# Patient Record
Sex: Male | Born: 1955
Health system: Southern US, Community
[De-identification: ages and names within clinical notes are randomized; demographics above are authoritative.]

## PROBLEM LIST (undated history)

## (undated) DIAGNOSIS — E119 Type 2 diabetes mellitus without complications: Secondary | ICD-10-CM

## (undated) DIAGNOSIS — H269 Unspecified cataract: Secondary | ICD-10-CM

## (undated) DIAGNOSIS — W3400XA Accidental discharge from unspecified firearms or gun, initial encounter: Secondary | ICD-10-CM

## (undated) DIAGNOSIS — I10 Essential (primary) hypertension: Secondary | ICD-10-CM

## (undated) DIAGNOSIS — F32A Depression, unspecified: Secondary | ICD-10-CM

## (undated) DIAGNOSIS — T79A0XA Compartment syndrome, unspecified, initial encounter: Secondary | ICD-10-CM

## (undated) DIAGNOSIS — S71139A Puncture wound without foreign body, unspecified thigh, initial encounter: Secondary | ICD-10-CM

## (undated) DIAGNOSIS — B1920 Unspecified viral hepatitis C without hepatic coma: Secondary | ICD-10-CM

## (undated) DIAGNOSIS — J189 Pneumonia, unspecified organism: Secondary | ICD-10-CM

## (undated) DIAGNOSIS — F329 Major depressive disorder, single episode, unspecified: Secondary | ICD-10-CM

## (undated) HISTORY — DX: Type 2 diabetes mellitus without complications: E11.9

## (undated) HISTORY — PX: LEG SURGERY: SHX1003

## (undated) HISTORY — PX: OTHER SURGICAL HISTORY: SHX169

## (undated) HISTORY — PX: HERNIA REPAIR: SHX51

## (undated) HISTORY — PX: KNEE SURGERY: SHX244

## (undated) HISTORY — DX: Unspecified cataract: H26.9

---

## 1999-10-03 ENCOUNTER — Encounter: Payer: Self-pay | Admitting: Emergency Medicine

## 1999-10-03 ENCOUNTER — Emergency Department (HOSPITAL_COMMUNITY): Admission: EM | Admit: 1999-10-03 | Discharge: 1999-10-03 | Payer: Self-pay | Admitting: Emergency Medicine

## 2001-08-16 ENCOUNTER — Emergency Department (HOSPITAL_COMMUNITY): Admission: EM | Admit: 2001-08-16 | Discharge: 2001-08-16 | Payer: Self-pay | Admitting: Emergency Medicine

## 2001-08-17 ENCOUNTER — Ambulatory Visit (HOSPITAL_COMMUNITY): Admission: RE | Admit: 2001-08-17 | Discharge: 2001-08-17 | Payer: Self-pay | Admitting: Specialist

## 2002-11-13 ENCOUNTER — Inpatient Hospital Stay (HOSPITAL_COMMUNITY): Admission: EM | Admit: 2002-11-13 | Discharge: 2002-12-25 | Payer: Self-pay

## 2002-11-13 ENCOUNTER — Encounter (INDEPENDENT_AMBULATORY_CARE_PROVIDER_SITE_OTHER): Payer: Self-pay | Admitting: *Deleted

## 2002-11-14 ENCOUNTER — Encounter: Payer: Self-pay | Admitting: Gastroenterology

## 2002-11-15 ENCOUNTER — Encounter: Payer: Self-pay | Admitting: *Deleted

## 2003-01-30 ENCOUNTER — Ambulatory Visit (HOSPITAL_COMMUNITY): Admission: RE | Admit: 2003-01-30 | Discharge: 2003-01-30 | Payer: Self-pay | Admitting: Plastic Surgery

## 2003-02-25 ENCOUNTER — Encounter: Admission: RE | Admit: 2003-02-25 | Discharge: 2003-02-25 | Payer: Self-pay | Admitting: Family Medicine

## 2003-03-11 ENCOUNTER — Encounter: Admission: RE | Admit: 2003-03-11 | Discharge: 2003-03-11 | Payer: Self-pay | Admitting: Family Medicine

## 2003-03-19 ENCOUNTER — Inpatient Hospital Stay (HOSPITAL_COMMUNITY): Admission: EM | Admit: 2003-03-19 | Discharge: 2003-03-21 | Payer: Self-pay | Admitting: Emergency Medicine

## 2003-03-27 ENCOUNTER — Encounter: Admission: RE | Admit: 2003-03-27 | Discharge: 2003-03-27 | Payer: Self-pay | Admitting: Family Medicine

## 2003-04-10 ENCOUNTER — Encounter: Admission: RE | Admit: 2003-04-10 | Discharge: 2003-04-10 | Payer: Self-pay | Admitting: Sports Medicine

## 2003-04-24 ENCOUNTER — Encounter: Admission: RE | Admit: 2003-04-24 | Discharge: 2003-04-24 | Payer: Self-pay | Admitting: Family Medicine

## 2003-04-25 ENCOUNTER — Encounter
Admission: RE | Admit: 2003-04-25 | Discharge: 2003-07-24 | Payer: Self-pay | Admitting: Physical Medicine & Rehabilitation

## 2003-06-11 ENCOUNTER — Encounter
Admission: RE | Admit: 2003-06-11 | Discharge: 2003-09-09 | Payer: Self-pay | Admitting: Physical Medicine & Rehabilitation

## 2003-10-20 ENCOUNTER — Encounter
Admission: RE | Admit: 2003-10-20 | Discharge: 2003-11-20 | Payer: Self-pay | Admitting: Physical Medicine & Rehabilitation

## 2003-11-14 ENCOUNTER — Ambulatory Visit: Payer: Self-pay | Admitting: Psychology

## 2003-11-20 ENCOUNTER — Encounter
Admission: RE | Admit: 2003-11-20 | Discharge: 2004-02-18 | Payer: Self-pay | Admitting: Physical Medicine & Rehabilitation

## 2003-11-25 ENCOUNTER — Ambulatory Visit: Payer: Self-pay | Admitting: Physical Medicine & Rehabilitation

## 2004-01-26 ENCOUNTER — Ambulatory Visit: Payer: Self-pay | Admitting: Physical Medicine & Rehabilitation

## 2004-03-23 ENCOUNTER — Ambulatory Visit: Payer: Self-pay | Admitting: Family Medicine

## 2004-04-22 ENCOUNTER — Ambulatory Visit: Payer: Self-pay | Admitting: Family Medicine

## 2004-05-06 ENCOUNTER — Ambulatory Visit: Payer: Self-pay | Admitting: Family Medicine

## 2004-05-21 ENCOUNTER — Encounter
Admission: RE | Admit: 2004-05-21 | Discharge: 2004-08-19 | Payer: Self-pay | Admitting: Physical Medicine & Rehabilitation

## 2004-05-25 ENCOUNTER — Ambulatory Visit: Payer: Self-pay | Admitting: Physical Medicine & Rehabilitation

## 2004-05-27 ENCOUNTER — Ambulatory Visit: Payer: Self-pay | Admitting: Family Medicine

## 2004-09-21 ENCOUNTER — Ambulatory Visit: Payer: Self-pay | Admitting: Family Medicine

## 2004-10-08 ENCOUNTER — Ambulatory Visit: Payer: Self-pay | Admitting: Physical Medicine & Rehabilitation

## 2004-10-08 ENCOUNTER — Encounter
Admission: RE | Admit: 2004-10-08 | Discharge: 2005-01-06 | Payer: Self-pay | Admitting: Physical Medicine & Rehabilitation

## 2004-11-16 ENCOUNTER — Ambulatory Visit: Payer: Self-pay | Admitting: Physical Medicine & Rehabilitation

## 2004-12-17 ENCOUNTER — Ambulatory Visit: Payer: Self-pay | Admitting: Physical Medicine & Rehabilitation

## 2005-01-17 ENCOUNTER — Ambulatory Visit: Payer: Self-pay | Admitting: Physical Medicine & Rehabilitation

## 2005-01-17 ENCOUNTER — Encounter
Admission: RE | Admit: 2005-01-17 | Discharge: 2005-04-17 | Payer: Self-pay | Admitting: Physical Medicine & Rehabilitation

## 2005-03-14 ENCOUNTER — Ambulatory Visit: Payer: Self-pay | Admitting: Physical Medicine & Rehabilitation

## 2005-05-06 ENCOUNTER — Ambulatory Visit: Payer: Self-pay | Admitting: Physical Medicine & Rehabilitation

## 2005-05-06 ENCOUNTER — Encounter
Admission: RE | Admit: 2005-05-06 | Discharge: 2005-08-04 | Payer: Self-pay | Admitting: Physical Medicine & Rehabilitation

## 2005-06-07 ENCOUNTER — Ambulatory Visit: Payer: Self-pay | Admitting: Family Medicine

## 2005-06-21 ENCOUNTER — Ambulatory Visit: Payer: Self-pay | Admitting: Family Medicine

## 2005-07-04 ENCOUNTER — Ambulatory Visit: Payer: Self-pay | Admitting: Physical Medicine & Rehabilitation

## 2005-07-04 ENCOUNTER — Ambulatory Visit: Payer: Self-pay | Admitting: Psychology

## 2005-07-04 ENCOUNTER — Encounter: Admission: RE | Admit: 2005-07-04 | Discharge: 2005-10-02 | Payer: Self-pay

## 2005-07-12 ENCOUNTER — Ambulatory Visit: Payer: Self-pay | Admitting: Family Medicine

## 2005-07-28 ENCOUNTER — Encounter: Admission: RE | Admit: 2005-07-28 | Discharge: 2005-07-28 | Payer: Self-pay | Admitting: Sports Medicine

## 2005-07-28 ENCOUNTER — Ambulatory Visit: Payer: Self-pay | Admitting: Family Medicine

## 2005-08-05 ENCOUNTER — Ambulatory Visit: Payer: Self-pay | Admitting: Family Medicine

## 2005-08-08 ENCOUNTER — Ambulatory Visit: Payer: Self-pay | Admitting: Family Medicine

## 2005-08-26 ENCOUNTER — Ambulatory Visit: Payer: Self-pay | Admitting: Physical Medicine & Rehabilitation

## 2005-08-26 ENCOUNTER — Encounter
Admission: RE | Admit: 2005-08-26 | Discharge: 2005-11-24 | Payer: Self-pay | Admitting: Physical Medicine & Rehabilitation

## 2005-10-21 ENCOUNTER — Ambulatory Visit: Payer: Self-pay | Admitting: Physical Medicine & Rehabilitation

## 2005-11-23 ENCOUNTER — Encounter
Admission: RE | Admit: 2005-11-23 | Discharge: 2006-02-21 | Payer: Self-pay | Admitting: Physical Medicine & Rehabilitation

## 2005-11-23 ENCOUNTER — Ambulatory Visit: Payer: Self-pay | Admitting: Physical Medicine & Rehabilitation

## 2006-01-10 ENCOUNTER — Ambulatory Visit: Payer: Self-pay | Admitting: Family Medicine

## 2006-01-13 ENCOUNTER — Encounter
Admission: RE | Admit: 2006-01-13 | Discharge: 2006-04-13 | Payer: Self-pay | Admitting: Physical Medicine & Rehabilitation

## 2006-01-18 ENCOUNTER — Ambulatory Visit: Payer: Self-pay | Admitting: Physical Medicine & Rehabilitation

## 2006-03-14 ENCOUNTER — Encounter
Admission: RE | Admit: 2006-03-14 | Discharge: 2006-06-12 | Payer: Self-pay | Admitting: Physical Medicine and Rehabilitation

## 2006-03-14 ENCOUNTER — Ambulatory Visit: Payer: Self-pay | Admitting: Physical Medicine and Rehabilitation

## 2006-03-17 ENCOUNTER — Emergency Department (HOSPITAL_COMMUNITY): Admission: EM | Admit: 2006-03-17 | Discharge: 2006-03-17 | Payer: Self-pay | Admitting: Emergency Medicine

## 2006-03-24 ENCOUNTER — Encounter (INDEPENDENT_AMBULATORY_CARE_PROVIDER_SITE_OTHER): Payer: Self-pay | Admitting: Family Medicine

## 2006-03-24 ENCOUNTER — Ambulatory Visit: Payer: Self-pay | Admitting: Family Medicine

## 2006-03-24 ENCOUNTER — Ambulatory Visit (HOSPITAL_COMMUNITY): Admission: RE | Admit: 2006-03-24 | Discharge: 2006-03-24 | Payer: Self-pay | Admitting: Family Medicine

## 2006-03-24 LAB — CONVERTED CEMR LAB
BUN: 11 mg/dL (ref 6–23)
Calcium: 9.6 mg/dL (ref 8.4–10.5)
HDL: 41 mg/dL (ref >39)
Potassium: 5.3 meq/L (ref 3.5–5.3)
Sodium: 138 meq/L (ref 135–145)
Total CHOL/HDL Ratio: 4.6
VLDL: 17 mg/dL (ref 0–40)

## 2006-04-06 ENCOUNTER — Encounter: Payer: Self-pay | Admitting: Family Medicine

## 2006-04-06 ENCOUNTER — Ambulatory Visit: Payer: Self-pay | Admitting: Family Medicine

## 2006-04-06 LAB — CONVERTED CEMR LAB
ALT: 75 units/L — ABNORMAL HIGH (ref 0–53)
Chloride: 97 meq/L (ref 96–112)
Potassium: 4.4 meq/L (ref 3.5–5.3)
Sodium: 133 meq/L — ABNORMAL LOW (ref 135–145)
TSH: 3.472 microintl units/mL (ref 0.350–5.50)

## 2006-04-10 ENCOUNTER — Encounter
Admission: RE | Admit: 2006-04-10 | Discharge: 2006-07-09 | Payer: Self-pay | Admitting: Physical Medicine & Rehabilitation

## 2006-04-18 ENCOUNTER — Ambulatory Visit: Payer: Self-pay | Admitting: Physical Medicine & Rehabilitation

## 2006-04-27 DIAGNOSIS — I10 Essential (primary) hypertension: Secondary | ICD-10-CM | POA: Insufficient documentation

## 2006-04-27 DIAGNOSIS — Z720 Tobacco use: Secondary | ICD-10-CM

## 2006-04-27 DIAGNOSIS — F329 Major depressive disorder, single episode, unspecified: Secondary | ICD-10-CM

## 2006-04-27 DIAGNOSIS — B1710 Acute hepatitis C without hepatic coma: Secondary | ICD-10-CM | POA: Insufficient documentation

## 2006-04-27 DIAGNOSIS — F32A Depression, unspecified: Secondary | ICD-10-CM | POA: Insufficient documentation

## 2006-04-27 DIAGNOSIS — S86909S Unspecified injury of unspecified muscle(s) and tendon(s) at lower leg level, unspecified leg, sequela: Secondary | ICD-10-CM

## 2006-04-27 DIAGNOSIS — M79609 Pain in unspecified limb: Secondary | ICD-10-CM

## 2006-04-28 ENCOUNTER — Ambulatory Visit: Payer: Self-pay | Admitting: Family Medicine

## 2006-05-02 ENCOUNTER — Ambulatory Visit: Payer: Self-pay | Admitting: Family Medicine

## 2006-05-12 ENCOUNTER — Ambulatory Visit: Payer: Self-pay | Admitting: Physical Medicine and Rehabilitation

## 2006-06-13 ENCOUNTER — Encounter
Admission: RE | Admit: 2006-06-13 | Discharge: 2006-09-11 | Payer: Self-pay | Admitting: Physical Medicine and Rehabilitation

## 2006-07-10 ENCOUNTER — Encounter
Admission: RE | Admit: 2006-07-10 | Discharge: 2006-10-08 | Payer: Self-pay | Admitting: Physical Medicine & Rehabilitation

## 2006-07-11 ENCOUNTER — Ambulatory Visit: Payer: Self-pay | Admitting: Physical Medicine & Rehabilitation

## 2006-08-07 ENCOUNTER — Ambulatory Visit: Payer: Self-pay | Admitting: Physical Medicine and Rehabilitation

## 2006-09-07 ENCOUNTER — Ambulatory Visit: Payer: Self-pay | Admitting: Physical Medicine and Rehabilitation

## 2006-10-10 ENCOUNTER — Encounter
Admission: RE | Admit: 2006-10-10 | Discharge: 2006-11-28 | Payer: Self-pay | Admitting: Physical Medicine and Rehabilitation

## 2006-11-06 ENCOUNTER — Encounter
Admission: RE | Admit: 2006-11-06 | Discharge: 2007-02-04 | Payer: Self-pay | Admitting: Physical Medicine & Rehabilitation

## 2006-11-07 ENCOUNTER — Ambulatory Visit: Payer: Self-pay | Admitting: Physical Medicine & Rehabilitation

## 2006-12-01 ENCOUNTER — Ambulatory Visit: Payer: Self-pay | Admitting: Physical Medicine and Rehabilitation

## 2006-12-27 ENCOUNTER — Ambulatory Visit: Payer: Self-pay | Admitting: Physical Medicine & Rehabilitation

## 2007-01-08 ENCOUNTER — Encounter: Payer: Self-pay | Admitting: *Deleted

## 2007-01-08 ENCOUNTER — Ambulatory Visit: Payer: Self-pay | Admitting: Family Medicine

## 2007-01-08 ENCOUNTER — Telehealth: Payer: Self-pay | Admitting: *Deleted

## 2007-01-15 ENCOUNTER — Ambulatory Visit: Payer: Self-pay | Admitting: Family Medicine

## 2007-01-29 ENCOUNTER — Encounter
Admission: RE | Admit: 2007-01-29 | Discharge: 2007-04-29 | Payer: Self-pay | Admitting: Physical Medicine & Rehabilitation

## 2007-01-29 ENCOUNTER — Ambulatory Visit: Payer: Self-pay | Admitting: Physical Medicine & Rehabilitation

## 2007-02-19 ENCOUNTER — Ambulatory Visit: Payer: Self-pay | Admitting: Physical Medicine & Rehabilitation

## 2007-03-21 ENCOUNTER — Ambulatory Visit: Payer: Self-pay | Admitting: Physical Medicine & Rehabilitation

## 2007-04-23 ENCOUNTER — Encounter
Admission: RE | Admit: 2007-04-23 | Discharge: 2007-07-22 | Payer: Self-pay | Admitting: Physical Medicine & Rehabilitation

## 2007-04-23 ENCOUNTER — Ambulatory Visit: Payer: Self-pay | Admitting: Physical Medicine & Rehabilitation

## 2007-05-22 ENCOUNTER — Encounter: Payer: Self-pay | Admitting: Family Medicine

## 2007-05-22 ENCOUNTER — Ambulatory Visit: Payer: Self-pay | Admitting: Family Medicine

## 2007-05-22 ENCOUNTER — Inpatient Hospital Stay (HOSPITAL_COMMUNITY): Admission: EM | Admit: 2007-05-22 | Discharge: 2007-05-22 | Payer: Self-pay | Admitting: Family Medicine

## 2007-05-29 ENCOUNTER — Ambulatory Visit: Payer: Self-pay | Admitting: Physical Medicine & Rehabilitation

## 2007-06-26 ENCOUNTER — Ambulatory Visit: Payer: Self-pay | Admitting: Physical Medicine & Rehabilitation

## 2007-07-17 ENCOUNTER — Encounter
Admission: RE | Admit: 2007-07-17 | Discharge: 2007-10-15 | Payer: Self-pay | Admitting: Physical Medicine & Rehabilitation

## 2007-07-24 ENCOUNTER — Ambulatory Visit: Payer: Self-pay | Admitting: Physical Medicine & Rehabilitation

## 2007-08-22 ENCOUNTER — Ambulatory Visit: Payer: Self-pay | Admitting: Physical Medicine & Rehabilitation

## 2007-09-18 ENCOUNTER — Ambulatory Visit: Payer: Self-pay | Admitting: Physical Medicine & Rehabilitation

## 2007-10-11 ENCOUNTER — Encounter
Admission: RE | Admit: 2007-10-11 | Discharge: 2008-01-09 | Payer: Self-pay | Admitting: Physical Medicine & Rehabilitation

## 2007-10-16 ENCOUNTER — Ambulatory Visit: Payer: Self-pay | Admitting: Physical Medicine & Rehabilitation

## 2007-11-14 ENCOUNTER — Ambulatory Visit: Payer: Self-pay | Admitting: Physical Medicine & Rehabilitation

## 2007-11-19 ENCOUNTER — Ambulatory Visit: Payer: Self-pay | Admitting: Family Medicine

## 2007-11-19 DIAGNOSIS — G5600 Carpal tunnel syndrome, unspecified upper limb: Secondary | ICD-10-CM

## 2007-12-11 ENCOUNTER — Telehealth: Payer: Self-pay | Admitting: *Deleted

## 2007-12-20 ENCOUNTER — Ambulatory Visit: Payer: Self-pay | Admitting: Physical Medicine & Rehabilitation

## 2007-12-23 ENCOUNTER — Emergency Department (HOSPITAL_COMMUNITY): Admission: EM | Admit: 2007-12-23 | Discharge: 2007-12-23 | Payer: Self-pay | Admitting: Emergency Medicine

## 2007-12-27 ENCOUNTER — Encounter: Payer: Self-pay | Admitting: *Deleted

## 2007-12-31 ENCOUNTER — Telehealth: Payer: Self-pay | Admitting: Family Medicine

## 2008-01-02 ENCOUNTER — Encounter: Payer: Self-pay | Admitting: Family Medicine

## 2008-01-11 ENCOUNTER — Encounter
Admission: RE | Admit: 2008-01-11 | Discharge: 2008-03-27 | Payer: Self-pay | Admitting: Physical Medicine & Rehabilitation

## 2008-01-16 ENCOUNTER — Ambulatory Visit: Payer: Self-pay | Admitting: Physical Medicine & Rehabilitation

## 2008-02-13 ENCOUNTER — Ambulatory Visit: Payer: Self-pay | Admitting: Physical Medicine & Rehabilitation

## 2008-03-13 ENCOUNTER — Encounter
Admission: RE | Admit: 2008-03-13 | Discharge: 2008-03-13 | Payer: Self-pay | Admitting: Physical Medicine & Rehabilitation

## 2008-03-13 ENCOUNTER — Ambulatory Visit: Payer: Self-pay | Admitting: Physical Medicine & Rehabilitation

## 2008-04-03 ENCOUNTER — Ambulatory Visit: Payer: Self-pay | Admitting: Family Medicine

## 2008-05-02 ENCOUNTER — Ambulatory Visit: Payer: Self-pay | Admitting: Physical Medicine & Rehabilitation

## 2008-05-02 ENCOUNTER — Encounter
Admission: RE | Admit: 2008-05-02 | Discharge: 2008-07-31 | Payer: Self-pay | Admitting: Physical Medicine & Rehabilitation

## 2008-06-02 ENCOUNTER — Ambulatory Visit: Payer: Self-pay | Admitting: Family Medicine

## 2008-06-02 LAB — CONVERTED CEMR LAB
Albumin: 4.1 g/dL (ref 3.5–5.2)
Basophils Absolute: 0 10*3/uL (ref 0.0–0.1)
Basophils Relative: 1 % (ref 0–1)
CO2: 28 meq/L (ref 19–32)
Creatinine, Ser: 0.87 mg/dL (ref 0.40–1.50)
Eosinophils Absolute: 0.7 10*3/uL (ref 0.0–0.7)
Eosinophils Relative: 9 % — ABNORMAL HIGH (ref 0–5)
Glucose, Bld: 114 mg/dL — ABNORMAL HIGH (ref 70–99)
HCT: 48.9 % (ref 39.0–52.0)
Lymphs Abs: 3 10*3/uL (ref 0.7–4.0)
MCHC: 34.2 g/dL (ref 30.0–36.0)
MCV: 93.9 fL (ref 78.0–100.0)
Monocytes Absolute: 0.7 10*3/uL (ref 0.1–1.0)
Neutro Abs: 3.3 10*3/uL (ref 1.7–7.7)
PSA: 0.31 ng/mL (ref 0.10–4.00)
Potassium: 4.8 meq/L (ref 3.5–5.3)
Total Protein: 8 g/dL (ref 6.0–8.3)

## 2008-06-03 ENCOUNTER — Encounter: Payer: Self-pay | Admitting: Family Medicine

## 2008-06-06 ENCOUNTER — Ambulatory Visit: Payer: Self-pay | Admitting: Physical Medicine & Rehabilitation

## 2008-06-23 ENCOUNTER — Ambulatory Visit: Payer: Self-pay | Admitting: Internal Medicine

## 2008-06-25 ENCOUNTER — Ambulatory Visit: Payer: Self-pay | Admitting: Family Medicine

## 2008-07-04 ENCOUNTER — Ambulatory Visit: Payer: Self-pay | Admitting: Physical Medicine & Rehabilitation

## 2008-07-07 ENCOUNTER — Ambulatory Visit: Payer: Self-pay | Admitting: Internal Medicine

## 2008-08-01 ENCOUNTER — Encounter
Admission: RE | Admit: 2008-08-01 | Discharge: 2008-08-26 | Payer: Self-pay | Admitting: Physical Medicine & Rehabilitation

## 2008-08-01 ENCOUNTER — Ambulatory Visit: Payer: Self-pay | Admitting: Physical Medicine & Rehabilitation

## 2008-08-05 ENCOUNTER — Encounter: Payer: Self-pay | Admitting: Family Medicine

## 2008-08-06 ENCOUNTER — Ambulatory Visit: Payer: Self-pay | Admitting: Family Medicine

## 2008-08-18 ENCOUNTER — Ambulatory Visit: Payer: Self-pay | Admitting: Family Medicine

## 2008-08-26 ENCOUNTER — Ambulatory Visit: Payer: Self-pay | Admitting: Physical Medicine & Rehabilitation

## 2008-09-15 ENCOUNTER — Ambulatory Visit: Payer: Self-pay | Admitting: Family Medicine

## 2008-10-13 ENCOUNTER — Encounter (INDEPENDENT_AMBULATORY_CARE_PROVIDER_SITE_OTHER): Payer: Self-pay | Admitting: *Deleted

## 2008-10-17 ENCOUNTER — Telehealth: Payer: Self-pay | Admitting: Family Medicine

## 2008-10-21 ENCOUNTER — Encounter: Payer: Self-pay | Admitting: Family Medicine

## 2008-10-30 ENCOUNTER — Ambulatory Visit: Payer: Self-pay | Admitting: Family Medicine

## 2008-12-08 ENCOUNTER — Ambulatory Visit: Payer: Self-pay | Admitting: Family Medicine

## 2008-12-08 LAB — CONVERTED CEMR LAB
BUN: 16 mg/dL (ref 6–23)
Calcium: 9.3 mg/dL (ref 8.4–10.5)
Chloride: 99 meq/L (ref 96–112)
Creatinine, Ser: 0.83 mg/dL (ref 0.40–1.50)
Glucose, Bld: 142 mg/dL — ABNORMAL HIGH (ref 70–99)
Potassium: 4.1 meq/L (ref 3.5–5.3)
Sodium: 134 meq/L — ABNORMAL LOW (ref 135–145)

## 2009-01-12 ENCOUNTER — Ambulatory Visit: Payer: Self-pay | Admitting: Family Medicine

## 2009-01-12 LAB — CONVERTED CEMR LAB
Albumin: 4.3 g/dL (ref 3.5–5.2)
Chloride: 96 meq/L (ref 96–112)
Creatinine, Ser: 0.86 mg/dL (ref 0.40–1.50)
Potassium: 4.7 meq/L (ref 3.5–5.3)
RDW: 13 % (ref 11.5–15.5)
TSH: 7.159 microintl units/mL — ABNORMAL HIGH (ref 0.350–4.500)
Total Bilirubin: 0.6 mg/dL (ref 0.3–1.2)

## 2009-02-09 ENCOUNTER — Ambulatory Visit: Payer: Self-pay | Admitting: Family Medicine

## 2009-02-09 LAB — CONVERTED CEMR LAB: Free T4: 1.17 ng/dL (ref 0.80–1.80)

## 2009-03-09 ENCOUNTER — Encounter: Payer: Self-pay | Admitting: Psychology

## 2009-03-09 ENCOUNTER — Ambulatory Visit: Payer: Self-pay | Admitting: Family Medicine

## 2009-04-13 ENCOUNTER — Ambulatory Visit: Payer: Self-pay | Admitting: Family Medicine

## 2009-05-11 ENCOUNTER — Ambulatory Visit: Payer: Self-pay | Admitting: Family Medicine

## 2009-07-13 ENCOUNTER — Ambulatory Visit: Payer: Self-pay | Admitting: Family Medicine

## 2009-07-14 ENCOUNTER — Encounter: Payer: Self-pay | Admitting: Family Medicine

## 2009-08-17 ENCOUNTER — Ambulatory Visit: Payer: Self-pay | Admitting: Family Medicine

## 2009-08-19 ENCOUNTER — Encounter: Payer: Self-pay | Admitting: Family Medicine

## 2009-10-07 ENCOUNTER — Ambulatory Visit: Payer: Self-pay | Admitting: Family Medicine

## 2009-11-12 ENCOUNTER — Ambulatory Visit: Payer: Self-pay | Admitting: Family Medicine

## 2010-02-03 ENCOUNTER — Ambulatory Visit: Payer: Self-pay | Admitting: Family Medicine

## 2010-02-15 ENCOUNTER — Ambulatory Visit: Payer: Self-pay | Admitting: Family Medicine

## 2010-02-15 ENCOUNTER — Telehealth: Payer: Self-pay | Admitting: Family Medicine

## 2010-02-15 DIAGNOSIS — S61409A Unspecified open wound of unspecified hand, initial encounter: Secondary | ICD-10-CM | POA: Insufficient documentation

## 2010-02-16 ENCOUNTER — Ambulatory Visit: Payer: Self-pay

## 2010-03-08 DIAGNOSIS — IMO0002 Reserved for concepts with insufficient information to code with codable children: Secondary | ICD-10-CM | POA: Insufficient documentation

## 2010-03-30 NOTE — Assessment & Plan Note (Signed)
Summary: f/u,df   Vital Signs:  Patient profile:   55 year old male Height:      71 inches Weight:      241 pounds BMI:     33.73 BSA:     2.28 Temp:     98.0 degrees F Pulse rate:   73 / minute BP sitting:   125 / 85  Vitals Entered By: Jone Baseman CMA (May 11, 2009 4:08 PM) CC: f/u Is Patient Diabetic? No Pain Assessment Patient in pain? yes     Location: left leg Intensity: 6   Primary Care Provider:  Pearlean Brownie MD  CC:  f/u.  History of Present Illness: HYPERTENSION Disease Monitoring   Blood pressure range: reports 120s/80s at home        Chest pain: N     Dyspnea:N Medications   Compliance:Took meds today   Lightheadedness: N     Edema:N Prevention   Exercise: not on a regular routine     Smoking does want to stop.  Down to 6 per day.  Will set a quit date  Leg Pain continues the fentanyl patches make bearable.  No skin breakdown or fevers   ROS - as above PMH - Medications reviewed and updated in medication list.  Smoking Status noted in VS form     Habits & Providers  Alcohol-Tobacco-Diet     Tobacco Status: current     Tobacco Counseling: to quit use of tobacco products     Cigarette Packs/Day: 0.5  Current Medications (verified): 1)  Amitriptyline Hcl 75 Mg  Tabs (Amitriptyline Hcl) .... 2 By Mouth At Bedtime 2)  Duragesic-50 50 Mcg/hr  Pt72 (Fentanyl) .Marland Kitchen.. 1 Patch Every 48 Hrs  Fill After 4/14 3)  Neurontin 800 Mg  Tabs (Gabapentin) .... 2 in Am and 1 in The Pm 4)  Adult Aspirin Low Strength 81 Mg  Tbdp (Aspirin) 5)  Lisinopril 20 Mg Tabs (Lisinopril) .Marland Kitchen.. 1 Tablet By Mouth Daily Take With Combination Pill 6)  Lisinopril-Hydrochlorothiazide 20-12.5 Mg Tabs (Lisinopril-Hydrochlorothiazide) .Marland Kitchen.. 1 Tablet By Mouth Daily 7)  Metoprolol Succinate 25 Mg Tb24 (Metoprolol Succinate) .... 2 Tabs Two Times A Day 8)  Duragesic-50 50 Mcg/hr Pt72 (Fentanyl) .Marland Kitchen.. 1 Q 48 Hrs  Allergies: 1)  ! Penicillin  Physical Exam  General:   Well-developed,well-nourished,in no acute distress; alert,appropriate and cooperative throughout examination   Impression & Recommendations:  Problem # 1:  HYPERTENSION, BENIGN SYSTEMIC (ICD-401.1) Assessment Improved  Continue current medications  His updated medication list for this problem includes:    Lisinopril 20 Mg Tabs (Lisinopril) .Marland Kitchen... 1 tablet by mouth daily take with combination pill    Lisinopril-hydrochlorothiazide 20-12.5 Mg Tabs (Lisinopril-hydrochlorothiazide) .Marland Kitchen... 1 tablet by mouth daily    Metoprolol Succinate 25 Mg Tb24 (Metoprolol succinate) .Marland Kitchen... 2 tabs two times a day  BP today: 125/85 Prior BP: 147/93 (04/13/2009)  Labs Reviewed: K+: 4.7 (01/12/2009) Creat: : 0.86 (01/12/2009)   Chol: 188 (03/24/2006)   HDL: 41 (03/24/2006)   LDL: 130 (03/24/2006)   TG: 83 (03/24/2006)  Orders: [facility]- Est Level  3 (99213)  Problem # 2:  LEG PAIN OR KNEE PAIN (ICD-729.5)  He may need to come off patch for $ reasons.  He will investigate.  continue Amitriptylene and Neurontin   Orders: [facility]- Est Level  3 (24401)  Problem # 3:  TOBACCO DEPENDENCE (ICD-305.1)  suggested quit date   Orders: [facility]- Est Level  3 (02725)  Complete Medication List: 1)  Amitriptyline Hcl 75  Mg Tabs (Amitriptyline hcl) .... 2 by mouth at bedtime 2)  Duragesic-50 50 Mcg/hr Pt72 (Fentanyl) .Marland Kitchen.. 1 patch every 48 hrs  fill after 4/14 3)  Neurontin 800 Mg Tabs (Gabapentin) .... 2 in am and 1 in the pm 4)  Adult Aspirin Low Strength 81 Mg Tbdp (Aspirin) 5)  Lisinopril 20 Mg Tabs (Lisinopril) .Marland Kitchen.. 1 tablet by mouth daily take with combination pill 6)  Lisinopril-hydrochlorothiazide 20-12.5 Mg Tabs (Lisinopril-hydrochlorothiazide) .Marland Kitchen.. 1 tablet by mouth daily 7)  Metoprolol Succinate 25 Mg Tb24 (Metoprolol succinate) .... 2 tabs two times a day 8)  Duragesic-50 50 Mcg/hr Pt72 (Fentanyl) .Marland Kitchen.. 1 q 48 hrs  Patient Instructions: 1)  Come back in 2 months 2)  Great work with the blood pressure 3)   Choose a quit date think of a substitute  Prescriptions: DURAGESIC-50 50 MCG/HR PT72 (FENTANYL) 1 q 48 hrs  #15 x 0   Entered and Authorized by:   Pearlean Brownie MD   Signed by:   Pearlean Brownie MD on 05/11/2009   Method used:   Handwritten   RxID:   [id number] DURAGESIC-50 50 MCG/HR  PT72 (FENTANYL) 1 patch every 48 hrs  fill after 4/14  #15 x 0   Entered and Authorized by:   Pearlean Brownie MD   Signed by:   Pearlean Brownie MD on 05/11/2009   Method used:   Handwritten   RxID:   [id number]

## 2010-03-30 NOTE — Assessment & Plan Note (Signed)
Summary: f/u,df   Vital Signs:  Patient profile:   55 year old male Height:      71 inches Weight:      243.1 pounds BMI:     34.03 Temp:     99.3 degrees F oral Pulse rate:   89 / minute BP sitting:   147 / 93  (right arm) Cuff size:   large  Vitals Entered By: Garen Grams LPN (April 13, 2009 3:41 PM) CC: med refills Is Patient Diabetic? No   Primary Care Provider:  Pearlean Brownie MD  CC:  med refills.  History of Present Illness: HYPERTENSION Disease Monitoring   Blood pressure range: still reports 120s/80s at home        Chest pain: N     Dyspnea:N Medications   Compliance:Took meds today   Lightheadedness: N     Edema:N Prevention   Exercise: not on a regular routine    Salt restriction:n  Smoking does want to stop.  Interested in [place] but not now.  Does not have time to talk with health counselor today  Leg Pain continues the fentanyl patches make bearable.  No skin breakdown or fevers   ROS - as above PMH - Medications reviewed and updated in medication list.  Smoking Status noted in VS form     Habits & Providers  Alcohol-Tobacco-Diet     Tobacco Status: current  Current Medications (verified): 1)  Amitriptyline Hcl 75 Mg  Tabs (Amitriptyline Hcl) .... 2 By Mouth At Bedtime 2)  Duragesic-50 50 Mcg/hr  Pt72 (Fentanyl) .Marland Kitchen.. 1 Patch Every 48 Hrs 3)  Neurontin 800 Mg  Tabs (Gabapentin) .... 2 in Am and 1 in The Pm 4)  Adult Aspirin Low Strength 81 Mg  Tbdp (Aspirin) 5)  Lisinopril 20 Mg Tabs (Lisinopril) .Marland Kitchen.. 1 Tablet By Mouth Daily Take With Combination Pill 6)  Lisinopril-Hydrochlorothiazide 20-12.5 Mg Tabs (Lisinopril-Hydrochlorothiazide) .Marland Kitchen.. 1 Tablet By Mouth Daily 7)  Metoprolol Succinate 25 Mg Tb24 (Metoprolol Succinate) .... 2 Tabs Two Times A Day  Allergies: 1)  ! Penicillin  Physical Exam  General:  Well-developed,well-nourished,in no acute distress; alert,appropriate and cooperative throughout examination   Impression &  Recommendations:  Problem # 1:  LEG PAIN OR KNEE PAIN (ICD-729.5) Assessment Unchanged  Refill his duragesic.  Using appropriately   Orders: [facility]- Est  Level 4 (16109)  Problem # 2:  HYPERTENSION, BENIGN SYSTEMIC (ICD-401.1) Assessment: Improved  Better control.  Asked him to bring in his readings  His updated medication list for this problem includes:    Lisinopril 20 Mg Tabs (Lisinopril) .Marland Kitchen... 1 tablet by mouth daily take with combination pill    Lisinopril-hydrochlorothiazide 20-12.5 Mg Tabs (Lisinopril-hydrochlorothiazide) .Marland Kitchen... 1 tablet by mouth daily    Metoprolol Succinate 25 Mg Tb24 (Metoprolol succinate) .Marland Kitchen... 2 tabs two times a day  BP today: 147/93 Prior BP: 165/118 (03/09/2009)  Labs Reviewed: K+: 4.7 (01/12/2009) Creat: : 0.86 (01/12/2009)   Chol: 188 (03/24/2006)   HDL: 41 (03/24/2006)   LDL: 130 (03/24/2006)   TG: 83 (03/24/2006)  Orders: [facility]- Est  Level 4 (60454)  Problem # 3:  TOBACCO DEPENDENCE (ICD-305.1) Asked to try to quit in his own.  If not succesful consider Chantix.  need to monitor closely with depressive history  Orders: [facility]- Est  Level 4 (99214)  Complete Medication List: 1)  Amitriptyline Hcl 75 Mg Tabs (Amitriptyline hcl) .... 2 by mouth at bedtime 2)  Duragesic-50 50 Mcg/hr Pt72 (Fentanyl) .Marland Kitchen.. 1 patch every 48 hrs  3)  Neurontin 800 Mg Tabs (Gabapentin) .... 2 in am and 1 in the pm 4)  Adult Aspirin Low Strength 81 Mg Tbdp (Aspirin) 5)  Lisinopril 20 Mg Tabs (Lisinopril) .Marland Kitchen.. 1 tablet by mouth daily take with combination pill 6)  Lisinopril-hydrochlorothiazide 20-12.5 Mg Tabs (Lisinopril-hydrochlorothiazide) .Marland Kitchen.. 1 tablet by mouth daily 7)  Metoprolol Succinate 25 Mg Tb24 (Metoprolol succinate) .... 2 tabs two times a day  Patient Instructions: 1)  Please schedule a follow-up appointment in 1 month.  2)  Write down blood pressure readings and bring next visit 3)  Keep working on stop smoking

## 2010-03-30 NOTE — Miscellaneous (Signed)
  Clinical Lists Changes  Medications: Changed medication from LISINOPRIL-HYDROCHLOROTHIAZIDE 20-12.5 MG TABS (LISINOPRIL-HYDROCHLOROTHIAZIDE) 1 tablet by mouth daily to LISINOPRIL-HYDROCHLOROTHIAZIDE 20-25 MG TABS (LISINOPRIL-HYDROCHLOROTHIAZIDE) 1 daily for blood pressure

## 2010-03-30 NOTE — Assessment & Plan Note (Signed)
Summary: f/u meds,df   Vital Signs:  Patient profile:   55 year old male Height:      71 inches Weight:      239 pounds BMI:     33.45 BSA:     2.28 Temp:     98.6 degrees F Pulse rate:   88 / minute BP sitting:   130 / 84  Vitals Entered By: Jone Baseman CMA (Jul 13, 2009 2:09 PM) CC: f/u Is Patient Diabetic? No Pain Assessment Patient in pain? no        Primary Care Provider:  Pearlean Brownie MD  CC:  f/u.  History of Present Illness: HYPERTENSION Disease Monitoring   Blood pressure range:120s/80s at home       Chest pain: N     Dyspnea:N Medications   Compliance: daily   Lightheadedness: N     Edema:N Prevention   Exercise: walking some    Leg pain  about the same.  Duragesic patch helps some  Depression difficulty sleeping and concentrating.  No suicidal ideation.  Feels this is chronic and worse for him since now on disability.  Tried counselor in past that did not help much but is willing to try again.    Had slightly high TSH last blood test.  Slowly losing weight but is trying.  No fevers   ROS - as above PMH - Medications reviewed and updated in medication list.  Smoking Status noted in VS form     Habits & Providers  Alcohol-Tobacco-Diet     Tobacco Status: current     Tobacco Counseling: to quit use of tobacco products     Cigarette Packs/Day: 0.5  Current Medications (verified): 1)  Amitriptyline Hcl 75 Mg  Tabs (Amitriptyline Hcl) .... 2 By Mouth At Bedtime 2)  Duragesic-50 50 Mcg/hr  Pt72 (Fentanyl) .Marland Kitchen.. 1 Patch Every 48 Hrs  Fill After 6/15 3)  Neurontin 800 Mg  Tabs (Gabapentin) .... 2 in Am and 1 in The Pm 4)  Adult Aspirin Low Strength 81 Mg  Tbdp (Aspirin) 5)  Lisinopril 20 Mg Tabs (Lisinopril) .Marland Kitchen.. 1 Tablet By Mouth Daily Take With Combination Pill 6)  Lisinopril-Hydrochlorothiazide 20-12.5 Mg Tabs (Lisinopril-Hydrochlorothiazide) .Marland Kitchen.. 1 Tablet By Mouth Daily 7)  Metoprolol Succinate 25 Mg Tb24 (Metoprolol Succinate) .... 2  Tabs Two Times A Day 8)  Duragesic-50 50 Mcg/hr Pt72 (Fentanyl) .Marland Kitchen.. 1 Q 48 Hrs 9)  Duragesic-50 50 Mcg/hr Pt72 (Fentanyl) .Marland Kitchen.. 1 Every 48 Hours Fill After 09/11/09  Allergies: 1)  ! Penicillin  Physical Exam  General:  Well-developed,well-nourished,in no acute distress; alert,appropriate and cooperative throughout examination   Impression & Recommendations:  Problem # 1:  HYPERTENSION, BENIGN SYSTEMIC (ICD-401.1)  Good control His updated medication list for this problem includes:    Lisinopril 20 Mg Tabs (Lisinopril) .Marland Kitchen... 1 tablet by mouth daily take with combination pill    Lisinopril-hydrochlorothiazide 20-12.5 Mg Tabs (Lisinopril-hydrochlorothiazide) .Marland Kitchen... 1 tablet by mouth daily    Metoprolol Succinate 25 Mg Tb24 (Metoprolol succinate) .Marland Kitchen... 2 tabs two times a day  BP today: 130/84 Prior BP: 125/85 (05/11/2009)  Labs Reviewed: K+: 4.7 (01/12/2009) Creat: : 0.86 (01/12/2009)   Chol: 188 (03/24/2006)   HDL: 41 (03/24/2006)   LDL: 130 (03/24/2006)   TG: 83 (03/24/2006)  Orders: [facility]- Est  Level 4 (91478)  Problem # 2:  DEPRESSIVE DISORDER, NOS (ICD-311) slight worsening.  Has tried to find a place to volunteer but hasn't yet.  Is open to counseling and he volunteered he  could try [facility] His updated medication list for this problem includes:    Amitriptyline Hcl 75 Mg Tabs (Amitriptyline hcl) .Marland Kitchen... 2 by mouth at bedtime  Orders: [facility] (64403-47425) [facility]- Est  Level 4 (99214)  Problem # 3:  LEG PAIN OR KNEE PAIN (ICD-729.5)  stable on current Duragesic patch.  Has used appropriately.  Will give 3 month supply  Orders: [facility]- Est  Level 4 (95638)  Complete Medication List: 1)  Amitriptyline Hcl 75 Mg Tabs (Amitriptyline hcl) .... 2 by mouth at bedtime 2)  Duragesic-50 50 Mcg/hr Pt72 (Fentanyl) .Marland Kitchen.. 1 patch every 48 hrs  fill after 6/15 3)  Neurontin 800 Mg Tabs (Gabapentin) .... 2 in am and 1 in the pm 4)  Adult Aspirin Low Strength 81 Mg Tbdp (Aspirin) 5)   Lisinopril 20 Mg Tabs (Lisinopril) .Marland Kitchen.. 1 tablet by mouth daily take with combination pill 6)  Lisinopril-hydrochlorothiazide 20-12.5 Mg Tabs (Lisinopril-hydrochlorothiazide) .Marland Kitchen.. 1 tablet by mouth daily 7)  Metoprolol Succinate 25 Mg Tb24 (Metoprolol succinate) .... 2 tabs two times a day 8)  Duragesic-50 50 Mcg/hr Pt72 (Fentanyl) .Marland Kitchen.. 1 q 48 hrs 9)  Duragesic-50 50 Mcg/hr Pt72 (Fentanyl) .Marland Kitchen.. 1 every 48 hours fill after 09/11/09  Patient Instructions: 1)  Please schedule a follow-up appointment in 3 months .  2)  I will call you if your lab is abnormal otherwise I will send you a letter within 2 weeks. 3)  Consider a 7 mg nicotine patch 4)  Consider attending our free stop smoking classes.  Session 1 the third Tuesday and Session 2 the fourth Tuesday of each month. Both at 4 PM at the [facility]. Please call [phone] to register or if you have questions.   5)  Contact the [facility] for counseling Prescriptions: DURAGESIC-50 50 MCG/HR PT72 (FENTANYL) 1 every 48 hours Fill after 09/11/09  #15 x 0   Entered and Authorized by:   Pearlean Brownie MD   Signed by:   Pearlean Brownie MD on 07/13/2009   Method used:   Handwritten   RxID:   [id number] DURAGESIC-50 50 MCG/HR PT72 (FENTANYL) 1 q 48 hrs  #15 x 0   Entered and Authorized by:   Pearlean Brownie MD   Signed by:   Pearlean Brownie MD on 07/13/2009   Method used:   Handwritten   RxID:   [id number]

## 2010-03-30 NOTE — Miscellaneous (Signed)
Summary: walk in  Clinical Lists Changes came in c/o bedbugs he got last week when [place] was at beach. red areas on r & l side of chest & abd. states there are some on his back as well.  also has had a sore throat x 3 days. VS obtained & placed in pcp schedule.[place] Circle RN  August 17, 2009 2:43 PM
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
## 2010-03-30 NOTE — Assessment & Plan Note (Signed)
Summary: f/up,tcb   Vital Signs:  Patient profile:   55 year old male Height:      71 inches Weight:      241 pounds BMI:     33.73 BSA:     2.28 Temp:     98.3 degrees F Pulse rate:   101 / minute BP sitting:   150 / 100  Vitals Entered By: Jone Baseman CMA (October 07, 2009 11:39 AM) CC: f/U Is Patient Diabetic? No Pain Assessment Patient in pain? no        Primary Care Provider:  Pearlean Brownie MD  CC:  f/U.  History of Present Illness: HYPERTENSION, BENIGN SYSTEMIC (ICD-401.1) Forgot his medications this am but relates that his blood pressure which he checks regularly is under good control.  Would like to get partial months rx of his metoprolol so can get all his medications at one visit.  No chest pain or shortness of breath or lightheadedness  TOBACCO DEPENDENCE (ICD-305.1) still 1/2 ppd.  Feels can stop on his own.  plans to do so in next month  Hepatitis C No abdominal pain or jaundice.  Wants to go to treatment but wants to stop smoking first   ROS - as above PMH - Medications reviewed and updated in medication list.  Smoking Status noted in VS form      Habits & Providers  Alcohol-Tobacco-Diet     Tobacco Status: current     Tobacco Counseling: to quit use of tobacco products     Cigarette Packs/Day: 0.5  Current Medications (verified): 1)  Amitriptyline Hcl 75 Mg  Tabs (Amitriptyline Hcl) .... 2 By Mouth At Bedtime 2)  Duragesic-50 50 Mcg/hr  Pt72 (Fentanyl) .Marland Kitchen.. 1 Patch Every 48 Hrs  Fill After 6/15 3)  Neurontin 800 Mg  Tabs (Gabapentin) .... 2 in Am and 1 in The Pm 4)  Adult Aspirin Low Strength 81 Mg  Tbdp (Aspirin) 5)  Lisinopril 20 Mg Tabs (Lisinopril) .Marland Kitchen.. 1 Tablet By Mouth Daily Take With Combination Pill 6)  Lisinopril-Hydrochlorothiazide 20-25 Mg Tabs (Lisinopril-Hydrochlorothiazide) .Marland Kitchen.. 1 Daily For Blood Pressure 7)  Metoprolol Succinate 25 Mg Tb24 (Metoprolol Succinate) .... 2 Tabs Two Times A Day 8)  Duragesic-50 50 Mcg/hr  Pt72 (Fentanyl) .Marland Kitchen.. 1 Q 48 Hrs 9)  Hydroxyzine Hcl 25 Mg Tabs (Hydroxyzine Hcl) .Marland Kitchen.. 1-2 By Mouth Q 6 Hrs As Needed For Itching 10)  Triamcinolone Acetonide 0.1 % Oint (Triamcinolone Acetonide) .... Apply Two Times A Day To Itching Areas 30 Gram  Allergies: 1)  ! Penicillin  Physical Exam  General:  Well-developed,well-nourished,in no acute distress; alert,appropriate and cooperative throughout examination   Impression & Recommendations:  Problem # 1:  HYPERTENSION, BENIGN SYSTEMIC (ICD-401.1) Assessment Deteriorated  he assures me is usually under good control but last visits has been elevated.  Will recheck in a month. His updated medication list for this problem includes:    Lisinopril 20 Mg Tabs (Lisinopril) .Marland Kitchen... 1 tablet by mouth daily take with combination pill    Lisinopril-hydrochlorothiazide 20-25 Mg Tabs (Lisinopril-hydrochlorothiazide) .Marland Kitchen... 1 daily for blood pressure    Metoprolol Succinate 25 Mg Tb24 (Metoprolol succinate) .Marland Kitchen... 2 tabs two times a day  BP today: 150/100 Prior BP: 157/106 (08/17/2009)  Labs Reviewed: K+: 4.7 (01/12/2009) Creat: : 0.86 (01/12/2009)   Chol: 188 (03/24/2006)   HDL: 41 (03/24/2006)   LDL: 130 (03/24/2006)   TG: 83 (03/24/2006)  Orders: [facility]- Est  Level 4 ([address]21)  Problem # 2:  TOBACCO DEPENDENCE (ICD-305.1)  again voices desire to quit.  Discussed plans and substitutes  Orders: [facility]- Est  Level 4 ([address])  Problem # 3:  HEPATITIS C (ICD-070.51) at risk for cancer.  Needs to have better blood pressure control and would be very good to have stopped smoking  Problem # 4:  LEG PAIN OR KNEE PAIN (ICD-729.5)  continues unchanged.  Continue patches and pain modulators   Orders: [facility]- Est  Level 4 ([address])  Complete Medication List: 1)  Amitriptyline Hcl 75 Mg Tabs (Amitriptyline hcl) .... 2 by mouth at bedtime 2)  Duragesic-50 50 Mcg/hr Pt72 (Fentanyl) .Marland Kitchen.. 1 patch every 48 hrs  fill after 6/15 3)  Neurontin 800 Mg Tabs (Gabapentin)  .... 2 in am and 1 in the pm 4)  Adult Aspirin Low Strength 81 Mg Tbdp (Aspirin) 5)  Lisinopril 20 Mg Tabs (Lisinopril) .Marland Kitchen.. 1 tablet by mouth daily take with combination pill 6)  Lisinopril-hydrochlorothiazide 20-25 Mg Tabs (Lisinopril-hydrochlorothiazide) .Marland Kitchen.. 1 daily for blood pressure 7)  Metoprolol Succinate 25 Mg Tb24 (Metoprolol succinate) .... 2 tabs two times a day 8)  Duragesic-50 50 Mcg/hr Pt72 (Fentanyl) .Marland Kitchen.. 1 q 48 hrs 9)  Hydroxyzine Hcl 25 Mg Tabs (Hydroxyzine hcl) .Marland Kitchen.. 1-2 by mouth q 6 hrs as needed for itching 10)  Triamcinolone Acetonide 0.1 % Oint (Triamcinolone acetonide) .... Apply two times a day to itching areas 30 gram  Patient Instructions: 1)  Please schedule a follow-up appointment in 1 month.  2)  Take your medicines every day 3)  Plan a quit date and come up with a substitute for smoking 4)  We need to discuss the Hep C treatment once your blood pressure is under control and you've stopped smoking Prescriptions: DURAGESIC-50 50 MCG/HR PT72 (FENTANYL) 1 q 48 hrs  #15 x 0   Entered and Authorized by:   Pearlean Brownie MD   Signed by:   Pearlean Brownie MD on 10/07/2009   Method used:   Handwritten   RxID:   [id number] NEURONTIN 800 MG  TABS (GABAPENTIN) 2 in AM and 1 in the PM  #45 x 0   Entered and Authorized by:   Pearlean Brownie MD   Signed by:   Pearlean Brownie MD on 10/07/2009   Method used:   [facility]        [facility] [phone]* (retail)       [address], [place]  [address]       Ph: [phone] or [phone]       Fax: [phone]   RxID:   [id number] METOPROLOL SUCCINATE 25 MG TB24 (METOPROLOL SUCCINATE) 2 tabs two times a day  #60 x 0   Entered and Authorized by:   Pearlean Brownie MD   Signed by:   Pearlean Brownie MD on 10/07/2009   Method used:   [facility]        [facility] [phone]* (retail)       [address]       [place], [place]  [address]       Ph: [phone] or  [phone]       Fax: [phone]   RxID:   [id number]

## 2010-03-30 NOTE — Letter (Signed)
Summary: Generic Letter  [facility]  [address]   [place], [place] [address]   Phone: [phone]  Fax: [phone]    07/14/2009  Andre Sherman [address] [place], [place]  [address]  Dear Andre Sherman,   I'm glad to say your thyroid test was normal.  Let me know if you have any problems finding a counselor.  Consider attending the smoking classes.    Sincerely,   Andre Brownie MD  Appended Document: Generic Letter mailed.  Appended Document: Generic Letter mailed.

## 2010-03-30 NOTE — Assessment & Plan Note (Signed)
Summary: fu/kh   Vital Signs:  Patient profile:   55 year old male Height:      71 inches Weight:      235 pounds BMI:     32.89 BSA:     2.26 Temp:     98.6 degrees F Pulse rate:   103 / minute BP sitting:   165 / 118  Vitals Entered By: Jone Baseman CMA (March 09, 2009 2:28 PM) CC: f/u and med refills Is Patient Diabetic? No Pain Assessment Patient in pain? yes     Location: left leg Intensity: 7   Primary Care Provider:  Pearlean Brownie MD  CC:  f/u and med refills.  History of Present Illness: HYPERTENSION Disease Monitoring   Blood pressure range:120s/80s at home        Chest pain: N     Dyspnea:N Medications   Compliance: Forgot medicines today and got speeding ticket on way here   Lightheadedness: N     Edema:N Prevention   Exercise: not on a regular routine    Salt restriction:n  Smoking does want to stop.  A little more than 1/2 ppd.  Smokes in the AM and PM but not much in middle of day Still has chronic cough but is improved  Leg Pain continues the fentanyl patches make bearable.  No skin breakdown or fevers   ROS - as above PMH - Medications reviewed and updated in medication list.  Smoking Status noted in VS form     Habits & Providers  Alcohol-Tobacco-Diet     Tobacco Status: current     Tobacco Counseling: to quit use of tobacco products     Cigarette Packs/Day: 0.5  Current Medications (verified): 1)  Amitriptyline Hcl 75 Mg  Tabs (Amitriptyline Hcl) .... 2 By Mouth At Bedtime 2)  Duragesic-50 50 Mcg/hr  Pt72 (Fentanyl) .Marland Kitchen.. 1 Patch Every 48 Hrs 3)  Neurontin 800 Mg  Tabs (Gabapentin) .... 2 in Am and 1 in The Pm 4)  Adult Aspirin Low Strength 81 Mg  Tbdp (Aspirin) 5)  Lisinopril 20 Mg Tabs (Lisinopril) .Marland Kitchen.. 1 Tablet By Mouth Daily Take With Combination Pill 6)  Lisinopril-Hydrochlorothiazide 20-12.5 Mg Tabs (Lisinopril-Hydrochlorothiazide) .Marland Kitchen.. 1 Tablet By Mouth Daily 7)  Metoprolol Succinate 25 Mg Tb24 (Metoprolol Succinate)  .... 2 Tabs Two Times A Day  Allergies: 1)  ! Penicillin  Physical Exam  General:  Well-developed,well-nourished,in no acute distress; alert,appropriate and cooperative throughout examination   Impression & Recommendations:  Problem # 1:  HYPERTENSION, BENIGN SYSTEMIC (ICD-401.1) Not well controlled - seems to keep forgetting his medications. Also got a speeding ticket on way  here today.  Was improved on recheck.  He reports is well controlled at home.  ask to bring in readings  His updated medication list for this problem includes:    Lisinopril 20 Mg Tabs (Lisinopril) .Marland Kitchen... 1 tablet by mouth daily take with combination pill    Lisinopril-hydrochlorothiazide 20-12.5 Mg Tabs (Lisinopril-hydrochlorothiazide) .Marland Kitchen... 1 tablet by mouth daily    Metoprolol Succinate 25 Mg Tb24 (Metoprolol succinate) .Marland Kitchen... 2 tabs two times a day  BP today: 165/118 Prior BP: 146/91 (02/09/2009)  Labs Reviewed: K+: 4.7 (01/12/2009) Creat: : 0.86 (01/12/2009)   Chol: 188 (03/24/2006)   HDL: 41 (03/24/2006)   LDL: 130 (03/24/2006)   TG: 83 (03/24/2006)  Problem # 2:  TOBACCO DEPENDENCE (ICD-305.1) Met with Ms Clifton Kyrin see her note.  His goal is to stop so can get Hep C evaluation Orders: [facility]- Est  Level 4 ([address])  Problem # 3:  LEG PAIN OR KNEE PAIN (ICD-729.5) Continued baseline pain controlled with fentanyl patches.  No evidence of abuse or divergence   Complete Medication List: 1)  Amitriptyline Hcl 75 Mg Tabs (Amitriptyline hcl) .... 2 by mouth at bedtime 2)  Duragesic-50 50 Mcg/hr Pt72 (Fentanyl) .Marland Kitchen.. 1 patch every 48 hrs 3)  Neurontin 800 Mg Tabs (Gabapentin) .... 2 in am and 1 in the pm 4)  Adult Aspirin Low Strength 81 Mg Tbdp (Aspirin) 5)  Lisinopril 20 Mg Tabs (Lisinopril) .Marland Kitchen.. 1 tablet by mouth daily take with combination pill 6)  Lisinopril-hydrochlorothiazide 20-12.5 Mg Tabs (Lisinopril-hydrochlorothiazide) .Marland Kitchen.. 1 tablet by mouth daily 7)  Metoprolol Succinate 25 Mg Tb24 (Metoprolol  succinate) .... 2 tabs two times a day  Patient Instructions: 1)  Please schedule a follow-up appointment in 1 month.  2)  Work in on getting a routine 3)  Use a pill box every day 4)  Write down your blood pressure and bring in the readings 5)  Call if your blood pressure is > 160/95 regularly Prescriptions: DURAGESIC-50 50 MCG/HR  PT72 (FENTANYL) 1 patch every 48 hrs  #15 x 0   Entered and Authorized by:   Pearlean Brownie MD   Signed by:   Pearlean Brownie MD on 03/09/2009   Method used:   Handwritten   RxID:   [id number]

## 2010-03-30 NOTE — Assessment & Plan Note (Signed)
Summary: f/u meds,df   Vital Signs:  Patient profile:   55 year old male Height:      71 inches Weight:      242.56 pounds BMI:     33.95 Temp:     98.4 degrees F oral Pulse rate:   86 / minute BP sitting:   147 / 90  (right arm)  Vitals Entered By: Terese Door (February 03, 2010 12:01 PM) CC: F/U Medication Is Patient Diabetic? No Pain Assessment Patient in pain? yes     Location: leg Intensity: 6 Type: aching   Primary Care Provider:  Pearlean Brownie MD  CC:  F/U Medication.  History of Present Illness: Current Problems:  HEPATITIS C (ICD-070.51) HYPERTENSION, BENIGN SYSTEMIC (ICD-401.1) he is checking his blood pressure at home and has been under control.  Taking his medications regularly without prpblems.  No chest pain or lightheadedness or edema  TOBACCO DEPENDENCE (ICD-305.1) 8-12 cigs per day.  May try to stop after holidays  LEG PAIN OR KNEE PAIN (ICD-729.5) under fair to good control with patches.  Seem to last 48 hrs.  Allows him to be active and do some work and care for his grandson  DEPRESSIVE DISORDER, NOS (ICD-311) stable on curent medications.  He is stressed with finances but not feeling down or and suicidal ideation  ROS - as above PMH - Medications reviewed and updated in medication list.  Smoking Status noted in VS form      Habits & Providers  Alcohol-Tobacco-Diet     Tobacco Status: current     Tobacco Counseling: to quit use of tobacco products     Cigarette Packs/Day: 0.5  Allergies: 1)  ! Penicillin  Physical Exam  General:  Well-developed,well-nourished,in no acute distress; alert,appropriate and cooperative throughout examination   Impression & Recommendations:  Problem # 1:  HYPERTENSION, BENIGN SYSTEMIC (ICD-401.1)  stable  His updated medication list for this problem includes:    Lisinopril 20 Mg Tabs (Lisinopril) .Marland Kitchen... 1 tablet by mouth daily take with combination pill    Lisinopril-hydrochlorothiazide 20-25  Mg Tabs (Lisinopril-hydrochlorothiazide) .Marland Kitchen... 1 daily for blood pressure    Metoprolol Succinate 25 Mg Tb24 (Metoprolol succinate) .Marland Kitchen... 2 tabs two times a day  Orders: [facility]- Est  Level 4 ([address])  Problem # 2:  LEG PAIN OR KNEE PAIN (ICD-729.5) stable with current patches.  He is staying active   Problem # 3:  DEPRESSIVE DISORDER, NOS (ICD-311)  stable The following medications were removed from the medication list:    Hydroxyzine Hcl 25 Mg Tabs (Hydroxyzine hcl) .Marland Kitchen... 1-2 by mouth q 6 hrs as needed for itching His updated medication list for this problem includes:    Amitriptyline Hcl 75 Mg Tabs (Amitriptyline hcl) .Marland Kitchen... 2 by mouth at bedtime  Orders: [facility]- Est  Level 4 ([address])  Problem # 4:  HEPATITIS C (ICD-070.51) Now that his blood pressure is contrlled and hopefully if he can stop smoking will refer  for evaluation  Complete Medication List: 1)  Amitriptyline Hcl 75 Mg Tabs (Amitriptyline hcl) .... 2 by mouth at bedtime 2)  Duragesic-50 50 Mcg/hr Pt72 (Fentanyl) .Marland Kitchen.. 1 patch every 48 hrs  fill after 6/15 3)  Neurontin 800 Mg Tabs (Gabapentin) .... 2 in am and 1 in the pm 4)  Adult Aspirin Low Strength 81 Mg Tbdp (Aspirin) 5)  Lisinopril 20 Mg Tabs (Lisinopril) .Marland Kitchen.. 1 tablet by mouth daily take with combination pill 6)  Lisinopril-hydrochlorothiazide 20-25 Mg Tabs (Lisinopril-hydrochlorothiazide) .Marland Kitchen.. 1 daily  for blood pressure 7)  Metoprolol Succinate 25 Mg Tb24 (Metoprolol succinate) .... 2 tabs two times a day 8)  Duragesic-50 50 Mcg/hr Pt72 (Fentanyl) .Marland Kitchen.. 1 q 48 hrs  fill after 04/06/10  Patient Instructions: 1)  Please schedule a follow-up appointment in 3 months .  2)  Will check blood tests then 3)  Think about stopping smoking for the new year Prescriptions: DURAGESIC-50 50 MCG/HR PT72 (FENTANYL) 1 q 48 hrs  Fill after 04/06/10  #15 x 0   Entered and Authorized by:   Pearlean Brownie MD   Signed by:   Pearlean Brownie MD on 02/03/2010   Method used:    Handwritten   RxID:   [id number] DURAGESIC-50 50 MCG/HR PT72 (FENTANYL) 1 q 48 hrs  Fill after 03/06/10  #15 x 0   Entered and Authorized by:   Pearlean Brownie MD   Signed by:   Pearlean Brownie MD on 02/03/2010   Method used:   Handwritten   RxID:   [id number] DURAGESIC-50 50 MCG/HR PT72 (FENTANYL) 1 q 48 hrs  #15 x 0   Entered and Authorized by:   Pearlean Brownie MD   Signed by:   Pearlean Brownie MD on 02/03/2010   Method used:   Handwritten   RxID:   [id number]    Orders Added: 1)  [facility]- Est  Level 4 [[address]]     Prevention & Chronic Care Immunizations   Influenza vaccine: Fluvax 3+  (12/08/2008)   Influenza vaccine due: 11/18/2008    Tetanus booster: Not documented    Pneumococcal vaccine: Not documented  Colorectal Screening   Hemoccult: Not documented   Hemoccult action/deferral: Not indicated  (08/18/2008)   Hemoccult due: Not Indicated    Colonoscopy: Location:  [facility].    (07/07/2008)   Colonoscopy due: 06/2018  Other Screening   PSA: 0.31  (06/02/2008)   Smoking status: current  (02/03/2010)   Smoking cessation counseling: YES  (02/09/2009)  Lipids   Total Cholesterol: 188  (03/24/2006)   LDL: 130  (03/24/2006)   LDL Direct: Not documented   HDL: 41  (03/24/2006)   Triglycerides: 83  (03/24/2006)  Hypertension   Last Blood Pressure: 147 / 90  (02/03/2010)   Serum creatinine: 0.86  (01/12/2009)   Serum potassium 4.7  (01/12/2009)  Self-Management Support :    Hypertension self-management support: BP self-monitoring log, Written self-care plan  (10/30/2008)

## 2010-03-30 NOTE — Assessment & Plan Note (Signed)
Summary: sorethroat & red spots on body/[place]   Vital Signs:  Patient profile:   55 year old male Height:      71 inches Weight:      234 pounds BMI:     32.75 Temp:     98.6 degrees F oral Pulse rate:   81 / minute BP sitting:   157 / 106  (left arm) Cuff size:   large  Vitals Entered By: Garen Grams LPN (August 17, 2009 2:35 PM) CC: sore throat x 3 days and red, itchy spots on chest x 5 days Is Patient Diabetic? No Pain Assessment Patient in pain? no        Primary Care Provider:  Pearlean Brownie MD  CC:  sore throat x 3 days and red and itchy spots on chest x 5 days.  History of Present Illness: Sore throat  for last 3 days.  No fever but hard to swallow.  No shortness of breath or swollen glands.  No new exposures.  Rash itchy bumps after slept in fold up bed and hotel at beach. No oral lesions or fevers or new medications or spreading.  No one else had but no one else slept in his bed  HTN did not take his medications today    Habits & Providers  Alcohol-Tobacco-Diet     Tobacco Status: current     Tobacco Counseling: to quit use of tobacco products     Cigarette Packs/Day: 0.5  Current Medications (verified): 1)  Amitriptyline Hcl 75 Mg  Tabs (Amitriptyline Hcl) .... 2 By Mouth At Bedtime 2)  Duragesic-50 50 Mcg/hr  Pt72 (Fentanyl) .Marland Kitchen.. 1 Patch Every 48 Hrs  Fill After 6/15 3)  Neurontin 800 Mg  Tabs (Gabapentin) .... 2 in Am and 1 in The Pm 4)  Adult Aspirin Low Strength 81 Mg  Tbdp (Aspirin) 5)  Lisinopril 20 Mg Tabs (Lisinopril) .Marland Kitchen.. 1 Tablet By Mouth Daily Take With Combination Pill 6)  Lisinopril-Hydrochlorothiazide 20-12.5 Mg Tabs (Lisinopril-Hydrochlorothiazide) .Marland Kitchen.. 1 Tablet By Mouth Daily 7)  Metoprolol Succinate 25 Mg Tb24 (Metoprolol Succinate) .... 2 Tabs Two Times A Day 8)  Duragesic-50 50 Mcg/hr Pt72 (Fentanyl) .Marland Kitchen.. 1 Q 48 Hrs 9)  Duragesic-50 50 Mcg/hr Pt72 (Fentanyl) .Marland Kitchen.. 1 Every 48 Hours Fill After 09/11/09 10)  Hydroxyzine Hcl 25 Mg Tabs  (Hydroxyzine Hcl) .Marland Kitchen.. 1-2 By Mouth Q 6 Hrs As Needed For Itching 11)  Triamcinolone Acetonide 0.1 % Oint (Triamcinolone Acetonide) .... Apply Two Times A Day To Itching Areas 30 Gram  Allergies: 1)  ! Penicillin  Physical Exam  General:  Well-developed,well-nourished,in no acute distress; alert,appropriate and cooperative throughout examination Mouth:  Oral mucosa  without lesions or exudates.   Throat is red without exudates Neck:  No deformities, masses, or tenderness noted. No adenopathy Lungs:  Normal respiratory effort, chest expands symmetrically. Lungs are clear to auscultation, no crackles or wheezes. Heart:  Normal rate and regular rhythm. S1 and S2 normal without gallop, murmur, click, rub or other extra sounds. Skin:  discrete red papules scattered on trunk under arms and back.  No red streaks but has excoriations.     Impression & Recommendations:  Problem # 1:  SKIN RASH (ICD-782.1)  most consistent with bed bug or other insect bite.  No red flags for systemic illness.  Treat with steroid ointment and atarax His updated medication list for this problem includes:    Triamcinolone Acetonide 0.1 % Oint (Triamcinolone acetonide) .Marland Kitchen... Apply two times a day to itching  areas 30 gram  Orders: [facility]- Est  Level 4 ([address])  Problem # 2:  SORE THROAT (ICD-462) Viral.  Treat symptomatically  His updated medication list for this problem includes:    Adult Aspirin Low Strength 81 Mg Tbdp (Aspirin)  Orders: Rapid Strep-[facility] ([address]) [facility]- Est  Level 4 ([address])  Problem # 3:  HYPERTENSION, BENIGN SYSTEMIC (ICD-401.1)  Poorly controlled today but did not take his medications.  Home readings according to him have been good His updated medication list for this problem includes:    Lisinopril 20 Mg Tabs (Lisinopril) .Marland Kitchen... 1 tablet by mouth daily take with combination pill    Lisinopril-hydrochlorothiazide 20-12.5 Mg Tabs (Lisinopril-hydrochlorothiazide) .Marland Kitchen... 1 tablet by mouth daily     Metoprolol Succinate 25 Mg Tb24 (Metoprolol succinate) .Marland Kitchen... 2 tabs two times a day  Orders: [facility]- Est  Level 4 ([address])  Problem # 4:  TOBACCO DEPENDENCE (ICD-305.1) Aims to stop smoking by August visit  Complete Medication List: 1)  Amitriptyline Hcl 75 Mg Tabs (Amitriptyline hcl) .... 2 by mouth at bedtime 2)  Duragesic-50 50 Mcg/hr Pt72 (Fentanyl) .Marland Kitchen.. 1 patch every 48 hrs  fill after 6/15 3)  Neurontin 800 Mg Tabs (Gabapentin) .... 2 in am and 1 in the pm 4)  Adult Aspirin Low Strength 81 Mg Tbdp (Aspirin) 5)  Lisinopril 20 Mg Tabs (Lisinopril) .Marland Kitchen.. 1 tablet by mouth daily take with combination pill 6)  Lisinopril-hydrochlorothiazide 20-12.5 Mg Tabs (Lisinopril-hydrochlorothiazide) .Marland Kitchen.. 1 tablet by mouth daily 7)  Metoprolol Succinate 25 Mg Tb24 (Metoprolol succinate) .... 2 tabs two times a day 8)  Duragesic-50 50 Mcg/hr Pt72 (Fentanyl) .Marland Kitchen.. 1 q 48 hrs 9)  Duragesic-50 50 Mcg/hr Pt72 (Fentanyl) .Marland Kitchen.. 1 every 48 hours fill after 09/11/09 10)  Hydroxyzine Hcl 25 Mg Tabs (Hydroxyzine hcl) .Marland Kitchen.. 1-2 by mouth q 6 hrs as needed for itching 11)  Triamcinolone Acetonide 0.1 % Oint (Triamcinolone acetonide) .... Apply two times a day to itching areas 30 gram  Patient Instructions: 1)  Use the oinment on the itchy spots two times a day 2)  Use hydroxyzine as needed for itching will make you sleepy 3)  Use tylenol 2 regular tablets three times a day for throat pain 4)  Use salt water gargles 3x a day and chloraseptic spray for throat pain  5)  Call if not better in 7 days or if high fever  Prescriptions: TRIAMCINOLONE ACETONIDE 0.1 % OINT (TRIAMCINOLONE ACETONIDE) apply two times a day to itching areas 30 gram  #1 x 0   Entered and Authorized by:   Pearlean Brownie MD   Signed by:   Pearlean Brownie MD on 08/17/2009   Method used:   [facility]        [facility] [phone]* (retail)       [address], [place]  [address]       Ph: [phone] or [phone]        Fax: [phone]   RxID:   [id number] HYDROXYZINE HCL 25 MG TABS (HYDROXYZINE HCL) 1-2 by mouth q 6 hrs as needed for itching  #30 x 1   Entered and Authorized by:   Pearlean Brownie MD   Signed by:   Pearlean Brownie MD on 08/17/2009   Method used:   [facility] to        [facility] [phone]* (retail)       [address] [facility]  [place], [place]  [address]       Ph: [phone] or [phone]       Fax: [phone]   RxID:   [id number]   Laboratory Results  Date/Time Received: August 17, 2009 2:38 PM  Date/Time Reported: August 17, 2009 2:47 PM   Other Tests  Rapid Strep: negative Comments: ...........test performed by...........Marland KitchenTerese Door, CMA

## 2010-03-30 NOTE — Miscellaneous (Signed)
Summary: Behavioral Medicine Student Consultation  Clinical Lists Changes  Reason for consult: Patient presents with a history of smoking and high blood pressure. Request for consult to discuss current barriers to smoking cessation and medication adherence.  History of problem: Patient reported that he successfully stopped smoking previously for several months with the help of "the patch", but indicated that once he discontinued "the patch" he resumed smoking. Dr. Deirdre Priest would like for him to quit and indicated that he and the patient have had several conversations about the possibility of him quitting. Patient has also had difficulty with taking his medication.  Assessment: Patient was able to identify that smoking has become a significant problem for him and reported that he is very interested in quitting. He identified that smoking has resulted in a significant "smokers cough" and that he is put off by the phlegm that he coughs up. He reported that he wants to quit smoking, but is not sure what is keeping him from going from wanting to do it to actually quitting. Patient reported that he wants to take his medication, but often forgets.  Intervention: MI - contemplation/preparation. Discussed patients previous attempt to quit smoking. Patient reported that "the patch" worked and he was able to quit smoking completely for several months. He indicated that once he stopped using the patch he started smoking again within several weeks. Discussed reasons why this attempt to quit was not successful long-term. Next, we discussed patients current desire to quit smoking. He reported that he has been thinking a lot about quitting recently and is very interested in doing so. Discussed reasons that are preventing him from moving from thinking about it to "doing it." Patient was not able to identify any specific thing that was preventing him from quitting, but wanted to take a couple of days to think about it.  Next, we discussed the patients medication adherence. Patient reported that he wants to take his medication, but forgets to take it in the mornings. Discussed a plan to aid in remembering to take his morning doses. Patient agreed to put up a note by the coffee pot to remind him to take his medication first thing in the morning.    Recommended follow-up:Patient will make an appointment with Dr. Deirdre Priest to discuss being prescribed smoking cessation aids. Recommend to check in about making a behavior change plan during next appointment.   Kathe Becton, Behavioral Medicine Student
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
## 2010-04-01 NOTE — Progress Notes (Signed)
  Phone Note Other Incoming   [facility] Summary of Call: [facility] to say that pt at the pharmacy and thought he was told that [facility].  After review or record rx found to have been sent to [facility] on [address].  Gave verbal order for same antibiotic regimen as outlined in dr. Eden Lathe note.  [facility] that was [facility] there.  Ellin Mayhew MD  February 15, 2010 6:18 PM

## 2010-05-10 ENCOUNTER — Ambulatory Visit (INDEPENDENT_AMBULATORY_CARE_PROVIDER_SITE_OTHER): Payer: Medicare Other | Admitting: Family Medicine

## 2010-05-10 ENCOUNTER — Encounter: Payer: Self-pay | Admitting: Family Medicine

## 2010-05-10 VITALS — BP 103/88 | HR 68 | Wt 238.0 lb

## 2010-05-10 DIAGNOSIS — M79609 Pain in unspecified limb: Secondary | ICD-10-CM

## 2010-05-10 DIAGNOSIS — I10 Essential (primary) hypertension: Secondary | ICD-10-CM

## 2010-05-10 DIAGNOSIS — B1710 Acute hepatitis C without hepatic coma: Secondary | ICD-10-CM

## 2010-05-10 DIAGNOSIS — F17200 Nicotine dependence, unspecified, uncomplicated: Secondary | ICD-10-CM

## 2010-05-10 LAB — CONVERTED CEMR LAB
AST: 84 units/L — ABNORMAL HIGH (ref 0–37)
BUN: 20 mg/dL (ref 6–23)
CO2: 29 meq/L (ref 19–32)
Chloride: 98 meq/L (ref 96–112)
Creatinine, Ser: 1.01 mg/dL (ref 0.40–1.50)
Potassium: 5.9 meq/L — ABNORMAL HIGH (ref 3.5–5.3)
Sodium: 135 meq/L (ref 135–145)
Total Protein: 8.1 g/dL (ref 6.0–8.3)

## 2010-05-10 MED ORDER — FENTANYL 50 MCG/HR TD PT72: 1.0000 | MEDICATED_PATCH | TRANSDERMAL | Status: DC

## 2010-05-10 NOTE — Patient Instructions (Signed)
Stopping smoking is your main task Tell all your friends and enemies you will be stopping Pick a quit date Figure out what to substitute for cigarettes Put $2 in a glass every day you don't smoke I will call you if the lab tests are bad ow will talk about them when you come back

## 2010-05-10 NOTE — Assessment & Plan Note (Signed)
Check cmet.  Seems ready for referral especially if can stop smoking

## 2010-05-10 NOTE — Assessment & Plan Note (Signed)
Discussed quit date (June) and approaches.  He would like to stop before Hep C evaluation

## 2010-05-10 NOTE — Assessment & Plan Note (Signed)
Continues unchanged.  Kept under control with fentanyl patches.  He is using appropriately

## 2010-05-10 NOTE — Progress Notes (Signed)
  Subjective:    Patient ID: Andre Sherman, male    DOB: 10/04/1955, 55 y.o.   MRN: [id number]  HPI  Leg Pain Continues about the same.  Worse with movements.  Swells during the day is better in the AM.  No skin breakdown.  No redness  Hypertension Does not check very often.  No chest pain or shortness of breath.  Leg swelling as above.  No lightheadedness.  Smoking Continues 1/2 ppd.  Would like to quit.  Came up with idea to tell all his relatives.  Hep C He would like to investigate therapy.  No jaundice or RUQ pain    Review of Systems see above     Objective:   Physical Exam    Heart:  Normal rate and regular rhythm. S1 and S2 normal without gallop, murmur, click, rub or other extra sounds  Lungs:  Normal respiratory effort, chest expands symmetrically. Lungs are clear to auscultation, no crackles or wheezes. Extremities - No clubbing, cyanosis, has edema under his compression stocking on the left     Assessment & Plan:

## 2010-05-10 NOTE — Assessment & Plan Note (Signed)
Well controlled today.   Check BMET.

## 2010-05-11 ENCOUNTER — Encounter: Payer: Self-pay | Admitting: Family Medicine

## 2010-05-11 LAB — COMPREHENSIVE METABOLIC PANEL
ALT: 133 U/L — ABNORMAL HIGH (ref 0–53)
AST: 84 U/L — ABNORMAL HIGH (ref 0–37)
Albumin: 4.3 g/dL (ref 3.5–5.2)
Alkaline Phosphatase: 61 U/L (ref 39–117)
BUN: 20 mg/dL (ref 6–23)
Calcium: 10.2 mg/dL (ref 8.4–10.5)
Chloride: 98 mEq/L (ref 96–112)
Creat: 1.01 mg/dL (ref 0.40–1.50)
Glucose, Bld: 107 mg/dL — ABNORMAL HIGH (ref 70–99)
Potassium: 5.9 mEq/L — ABNORMAL HIGH (ref 3.5–5.3)
Total Bilirubin: 0.6 mg/dL (ref 0.3–1.2)
Total Protein: 8.1 g/dL (ref 6.0–8.3)

## 2010-05-11 NOTE — Telephone Encounter (Signed)
Error

## 2010-07-07 ENCOUNTER — Other Ambulatory Visit (INDEPENDENT_AMBULATORY_CARE_PROVIDER_SITE_OTHER): Payer: Medicare Other | Admitting: Family Medicine

## 2010-07-07 DIAGNOSIS — M79609 Pain in unspecified limb: Secondary | ICD-10-CM

## 2010-07-07 DIAGNOSIS — I10 Essential (primary) hypertension: Secondary | ICD-10-CM

## 2010-07-07 NOTE — Telephone Encounter (Signed)
Refill request
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
## 2010-07-13 NOTE — Assessment & Plan Note (Signed)
Patient returns today with left lower extremity pain, has a history of  causalgia due to sciatic nerve injury.  He has been maintained on the  following medications Duragesic patch 50 mcg q.48 hours, Neurontin 400  q.i.d. which he receives from this office. From psychiatry he receives  Cymbalta and from primary care he receives amitriptyline 150 mg q.h.s.   His pain level is in the 6 to 8 range and is all below the left knee.  He continues to use his compression wrap for his chronic lymphedema in  the left lower extremity.  He has depression anxiety, some confusion, no  suicidal thoughts.   His blood pressure is 149/91, pulse 81, respirations 18, O2 sat 94% on  room air.  He did not bring his meds today and is reminded to bring all  meds so we can review them at each visit.  Well developed, well  nourished male.  Orientation is x 3.  Affect is alert.  No evidence of  lability, agitation.  Gait is favoring the left lower extremity but  otherwise no toe drooping.  He does have decreased push off with the  left lower extremity.  His plantar flexion strength is 3+.  Ankle  dorsiflexion is 5- and knee extension strength is 5.  His hamstring  strength is 4- on the left and 5 on the right.   IMPRESSION:  Sciatic neuropathy with causalgia.   PLAN:  Continue current medications with exception of increasing  gabapentin to 800 t.i.d. given some increased burning pain symptoms that  he has reported to me today.  He will see nursing in 1 month and see me  in 3 months.      [name], M.D.  Electronically Signed     AEK/MedQ  D:  11/07/2006 16:04:17  T:  11/08/2006 11:15:39  Job #:  [id number]
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
## 2010-07-13 NOTE — Assessment & Plan Note (Signed)
Andre Sherman returned today.  He has a history of causalgia left lower  extremity as well as acquired lymphedema.  He has been using Duragesic  50 mcg q.48 hours in combination with Neurontin 800 mg t.i.d.   INTERVAL HISTORY:  From a social standpoint lost his sister last week.   OTHER PAIN MEDICATIONS:  Amitriptyline 150 q.h.s.   He has had some problems with increased ankle edema despite wrapping.  Some of his wraps are stretching out a bit.   His average pain about 7 out of 10 described as sharp and constant.  Walk 10 minutes at a time.   REVIEW OF SYSTEMS:  Positive for depression, anxiety as well as limb  swelling.   SOCIAL HISTORY:  Married.  Smokes 1/2 pack a day.   His blood pressure is 159/96.  Pulse 97.  Respiratory rate is 18.  O2  sat 97% room air.  GENERAL:  No acute distress.  Mood, affect is flat and depressed.  Gait shows some tendency towards toe drag on the left side.  His motor  strength is 3- plantarflexion, dorsiflexion on the left side and 4 at  the knee flexor and knee extensor right side is 5/5 throughout.   IMPRESSION:  1. Left lower extremity distal sciatic nerve injury with causalgia.  2. Chronic lymphedema.   PLAN:  1. Continue Duragesic patch 50 mcg q.48 hours.  2. Amitriptyline 150 q.h.s.  3. Neurontin 800 t.i.d.   I will see him back in 3 months, nursing visit in 1 month.      [name], M.D.  Electronically Signed     AEK/MedQ  D:  06/26/2007 14:00:01  T:  06/26/2007 14:46:02  Job #:  [id number]

## 2010-07-13 NOTE — Assessment & Plan Note (Signed)
[name] returns today, history of causalgia of left lower extremity  as well as acquired lymphedema.   INTERVAL MEDICAL HISTORY:  No new medications other than increasing his  dosages on his antihypertensive medicines prescribed by Dr. Oda Cogan.   He is on lisinopril, hydrochlorothiazide, as well as metoprolol.   PAIN MEDICATIONS INCLUDE:  1. Duragesic 50 mcg q.48 h.  2. Neurontin 800 mg t.i.d.  3. Amitriptyline 150 nightly.   Oswestry disability index completed today 54%.   Pain is 7-8/10, described as sharp, stabbing, increases with general  activity, and pain is worse with walking and standing.   REVIEW OF SYSTEMS:  Depression, anxiety, limb swelling, shortness of  breath, wheezing, weight gain, suicidal thoughts in the past, but none  current, no plan.   PHYSICAL EXAMINATION:  He has left lower extremity swelling.  His motor  strength is unchanged compared to prior examinations.  Basically, 4/5  strength in knee extensors on left, 3- knee flexors, ankle plantar  flexors, dorsiflexors on the left, 5/5 on the right side.   IMPRESSION:  1. Causalgia, left lower extremity.  2. Lymphedema, acquired, left lower extremity.   PLAN:  Continue Duragesic patch 50 mcg q.48 h., Neurontin 800 mg t.i.d.,  and amitriptyline 150 nightly.  I will see him back in 3 months and  nursing visit 2 months.  We will check urine drug screen.  He forgot  bring in his medications today.  He will need to bring them back before  we issue these prescriptions.      [name], M.D.  Electronically Signed     AEK/MedQ  D:  12/20/2007 12:57:43  T:  12/21/2007 01:37:08  Job #:  [id number]   cc:   Dr. Oda Cogan
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
## 2010-08-04 ENCOUNTER — Encounter: Payer: Self-pay | Admitting: Family Medicine

## 2010-08-04 ENCOUNTER — Ambulatory Visit (INDEPENDENT_AMBULATORY_CARE_PROVIDER_SITE_OTHER): Payer: Medicare Other | Admitting: Family Medicine

## 2010-08-04 DIAGNOSIS — B1710 Acute hepatitis C without hepatic coma: Secondary | ICD-10-CM

## 2010-08-04 DIAGNOSIS — F17200 Nicotine dependence, unspecified, uncomplicated: Secondary | ICD-10-CM

## 2010-08-04 DIAGNOSIS — N529 Male erectile dysfunction, unspecified: Secondary | ICD-10-CM

## 2010-08-04 DIAGNOSIS — F329 Major depressive disorder, single episode, unspecified: Secondary | ICD-10-CM

## 2010-08-04 DIAGNOSIS — I10 Essential (primary) hypertension: Secondary | ICD-10-CM

## 2010-08-04 MED ORDER — FENTANYL 50 MCG/HR TD PT72: 1.0000 | MEDICATED_PATCH | TRANSDERMAL | Status: DC

## 2010-08-04 MED ORDER — VARDENAFIL HCL 20 MG PO TABS: 20.0000 mg | ORAL_TABLET | Freq: Every day | ORAL | Status: AC | PRN

## 2010-08-04 NOTE — Patient Instructions (Addendum)
Going to the [facility] is a very good idea  Take your blood pressure medicines every day  Once you are feeling less depressed and hopefully smoking less we will refer you to the Hep C clinic  Try 1/2 of Levitra first (hand wrote on AVS)

## 2010-08-04 NOTE — Assessment & Plan Note (Signed)
Fair control, but did not take meds today.  Continue current meds

## 2010-08-04 NOTE — Assessment & Plan Note (Signed)
Worsened.   Strongly encouraged to follow up with [facility] to for counseling.  Do not think adding more medications would be beneficial.

## 2010-08-04 NOTE — Progress Notes (Signed)
  Subjective:    Patient ID: Andre Sherman, male    DOB: 11-09-1955, 55 y.o.   MRN: [id number]  HPI  Leg Pain Continues about the same.  Worse with movements.  Wearing compression hose regularly.  Swells during the day is better in the AM.  No skin breakdown.  No redness  Hypertension Does not check.  Did not take his medicines this am.  Knows the name and times No chest pain or shortness of breath.  Leg swelling as above.  No lightheadedness.  Smoking Continues 1/2 ppd.  Sometimes more than others.  Wants to quit but really relaxes him.  Did not follow through with quit date  Depression Feeling more down the last month.  Sleeping more less appetite.  No changes to living situation still having issues with oldest son.   No suicidal ideation.   Would like to go to [facility] - attended once years ago.   FH - strong FH of depression  Review of Systems  See above     Objective:   Physical Exam    Heart - Regular rate and rhythm.  No murmurs, gallops or rubs.    Lungs:  Normal respiratory effort, chest expands symmetrically. Lungs are clear to auscultation, no crackles or wheezes. Psych:  Cognition and judgment appear intact. Alert, communicative  and cooperative with normal attention span and concentration. No apparent delusions, illusions, hallucinations.   Started visit seemingly down but by end had normal conversation and contact     Assessment & Plan:

## 2010-08-04 NOTE — Assessment & Plan Note (Signed)
Chronic elevation of LFTs.  Not a good candidate for therapy as long as is depressed.  Will refer once this improves

## 2010-08-04 NOTE — Assessment & Plan Note (Signed)
Likely age, medication and chronic disease related.  Trial of levitra.

## 2010-08-04 NOTE — Assessment & Plan Note (Signed)
Contemplative but has not followed through.  Perhaps counseling for depression will enable him

## 2010-08-07 ENCOUNTER — Other Ambulatory Visit (INDEPENDENT_AMBULATORY_CARE_PROVIDER_SITE_OTHER): Payer: Medicare Other | Admitting: Family Medicine

## 2010-08-07 DIAGNOSIS — I10 Essential (primary) hypertension: Secondary | ICD-10-CM

## 2010-08-09 NOTE — Telephone Encounter (Signed)
Refill request

## 2010-09-06 ENCOUNTER — Other Ambulatory Visit: Payer: Self-pay | Admitting: Family Medicine

## 2010-09-06 NOTE — Telephone Encounter (Signed)
Refill request

## 2010-09-09 ENCOUNTER — Other Ambulatory Visit: Payer: Self-pay | Admitting: Family Medicine

## 2010-09-09 NOTE — Telephone Encounter (Signed)
Refill request

## 2010-09-25 ENCOUNTER — Encounter: Payer: Self-pay | Admitting: Family Medicine

## 2010-11-22 ENCOUNTER — Encounter: Payer: Self-pay | Admitting: Family Medicine

## 2010-11-22 ENCOUNTER — Ambulatory Visit (INDEPENDENT_AMBULATORY_CARE_PROVIDER_SITE_OTHER): Payer: Medicare Other | Admitting: Family Medicine

## 2010-11-22 DIAGNOSIS — B1710 Acute hepatitis C without hepatic coma: Secondary | ICD-10-CM

## 2010-11-22 DIAGNOSIS — F329 Major depressive disorder, single episode, unspecified: Secondary | ICD-10-CM

## 2010-11-22 DIAGNOSIS — M79609 Pain in unspecified limb: Secondary | ICD-10-CM

## 2010-11-22 DIAGNOSIS — I10 Essential (primary) hypertension: Secondary | ICD-10-CM

## 2010-11-22 LAB — CBC
MCHC: 33.6
MCHC: 34.4
Platelets: 229
Platelets: 240
RBC: 4.84
RDW: 13.2

## 2010-11-22 LAB — CULTURE, BLOOD (ROUTINE X 2)
Culture: NO GROWTH
Culture: NO GROWTH

## 2010-11-22 LAB — DIFFERENTIAL
Basophils Absolute: 0.1
Basophils Relative: 1
Eosinophils Absolute: 0.4
Eosinophils Relative: 4
Lymphocytes Relative: 26
Lymphs Abs: 2.5
Monocytes Absolute: 0.9
Monocytes Relative: 10
Neutro Abs: 5.6
Neutrophils Relative %: 59

## 2010-11-22 LAB — BASIC METABOLIC PANEL
BUN: 10
BUN: 15
CO2: 29
Calcium: 8.6
GFR calc Af Amer: >60
GFR calc non Af Amer: >60
Glucose, Bld: 101 — ABNORMAL HIGH
Glucose, Bld: 106 — ABNORMAL HIGH
Potassium: 4.2
Sodium: 135

## 2010-11-22 LAB — D-DIMER, QUANTITATIVE: D-Dimer, Quant: 0.31

## 2010-11-22 MED ORDER — FENTANYL 50 MCG/HR TD PT72: 1.0000 | MEDICATED_PATCH | TRANSDERMAL | Status: DC

## 2010-11-22 NOTE — Progress Notes (Signed)
  Subjective:    Patient ID: Andre Sherman, male    DOB: 24-Nov-1955, 55 y.o.   MRN: [id number]  HPI  Leg Pain Continues about the same.  Worse with movements. Ran out of patches a few days ago and it hurts significantly more.  Wearing compression hose regularly.  Swells during the day is better in the AM.  No skin breakdown.  No redness  Hypertension Does not check.  Taking his medications regularly.  Knows the name and times No chest pain or shortness of breath.  Leg swelling as above.  No lightheadedness.  Smoking Continues 1/2 ppd.  Sometimes more than others.  Wants to quit but really relaxes him.  Has not made any progress in stopping.  Would like to for new years and because he knows it will interfere with possible hepatitis treatment  Depression About the same as usual.   Does have some problems getting to sleep.    No suicidal ideation.   Has not followed up with the [facility] - attended once years ago.   FH - strong FH of depression both sisters committed suicide  Review of Systems  See above   Review of Systems     Objective:   Physical Exam No acute distress       Assessment & Plan:

## 2010-11-22 NOTE — Assessment & Plan Note (Signed)
Well controlled. Continue current meds

## 2010-11-22 NOTE — Patient Instructions (Signed)
Your big goals are:  Stop Smoking  Improve your depression by exercise, and seeing the [facility]  I will ask Rosalita Chessman to call you to set up an appointment  I will see you back in early January

## 2010-11-22 NOTE — Assessment & Plan Note (Signed)
He would like to wait until after Jan to discuss referral.  Understands his depression should be better controlled

## 2010-11-22 NOTE — Assessment & Plan Note (Signed)
Chronic unchanged.  Feel counseling and increasing his activity (he watches TV frequently) would be most beneficial

## 2010-11-22 NOTE — Assessment & Plan Note (Signed)
Unchanged.  Using patches appropriately.  Will give 4 months supply

## 2010-11-26 NOTE — Progress Notes (Signed)
Schedule co-visit appointment with pt for 10/29.

## 2010-12-02 ENCOUNTER — Inpatient Hospital Stay (INDEPENDENT_AMBULATORY_CARE_PROVIDER_SITE_OTHER)
Admission: RE | Admit: 2010-12-02 | Discharge: 2010-12-02 | Disposition: A | Discharge status: Home / Self Care | Payer: Medicare Other | Source: Ambulatory Visit | Attending: Family Medicine | Admitting: Family Medicine

## 2010-12-02 ENCOUNTER — Ambulatory Visit (INDEPENDENT_AMBULATORY_CARE_PROVIDER_SITE_OTHER): Payer: Medicare Other

## 2010-12-02 DIAGNOSIS — S2249XA Multiple fractures of ribs, unspecified side, initial encounter for closed fracture: Secondary | ICD-10-CM

## 2010-12-27 ENCOUNTER — Ambulatory Visit: Payer: Medicare Other | Admitting: Home Health Services

## 2010-12-31 ENCOUNTER — Ambulatory Visit: Payer: Medicare Other | Admitting: Family Medicine

## 2011-01-06 ENCOUNTER — Emergency Department (INDEPENDENT_AMBULATORY_CARE_PROVIDER_SITE_OTHER)
Admission: EM | Admit: 2011-01-06 | Discharge: 2011-01-06 | Disposition: A | Discharge status: Nursing Home (Includes ICF) | Payer: Medicare Other | Source: Home / Self Care

## 2011-01-06 ENCOUNTER — Encounter (HOSPITAL_COMMUNITY): Payer: Self-pay | Admitting: Emergency Medicine

## 2011-01-06 ENCOUNTER — Emergency Department (HOSPITAL_COMMUNITY)
Admission: EM | Admit: 2011-01-06 | Discharge: 2011-01-07 | Disposition: A | Discharge status: Home / Self Care | Payer: Medicare Other | Attending: Emergency Medicine | Admitting: Emergency Medicine

## 2011-01-06 ENCOUNTER — Emergency Department (HOSPITAL_COMMUNITY): Payer: Medicare Other

## 2011-01-06 ENCOUNTER — Other Ambulatory Visit: Payer: Self-pay

## 2011-01-06 DIAGNOSIS — R471 Dysarthria and anarthria: Secondary | ICD-10-CM | POA: Insufficient documentation

## 2011-01-06 DIAGNOSIS — R209 Unspecified disturbances of skin sensation: Secondary | ICD-10-CM

## 2011-01-06 DIAGNOSIS — Z79899 Other long term (current) drug therapy: Secondary | ICD-10-CM | POA: Insufficient documentation

## 2011-01-06 DIAGNOSIS — R4789 Other speech disturbances: Secondary | ICD-10-CM

## 2011-01-06 DIAGNOSIS — G459 Transient cerebral ischemic attack, unspecified: Secondary | ICD-10-CM | POA: Insufficient documentation

## 2011-01-06 DIAGNOSIS — Z8619 Personal history of other infectious and parasitic diseases: Secondary | ICD-10-CM | POA: Insufficient documentation

## 2011-01-06 DIAGNOSIS — R29898 Other symptoms and signs involving the musculoskeletal system: Secondary | ICD-10-CM | POA: Insufficient documentation

## 2011-01-06 DIAGNOSIS — I10 Essential (primary) hypertension: Secondary | ICD-10-CM | POA: Insufficient documentation

## 2011-01-06 DIAGNOSIS — R4781 Slurred speech: Secondary | ICD-10-CM

## 2011-01-06 DIAGNOSIS — R202 Paresthesia of skin: Secondary | ICD-10-CM

## 2011-01-06 HISTORY — DX: Unspecified viral hepatitis C without hepatic coma: B19.20

## 2011-01-06 HISTORY — DX: Essential (primary) hypertension: I10

## 2011-01-06 LAB — BASIC METABOLIC PANEL
CO2: 29 mEq/L (ref 19–32)
Calcium: 9.7 mg/dL (ref 8.4–10.5)
GFR calc non Af Amer: >90 mL/min (ref >90)
Glucose, Bld: 84 mg/dL (ref 70–99)
Potassium: 5 mEq/L (ref 3.5–5.1)
Sodium: 136 mEq/L (ref 135–145)

## 2011-01-06 LAB — COMPREHENSIVE METABOLIC PANEL
ALT: 97 U/L — ABNORMAL HIGH (ref 0–53)
AST: 79 U/L — ABNORMAL HIGH (ref 0–37)
Albumin: 3.3 g/dL — ABNORMAL LOW (ref 3.5–5.2)
CO2: 26 mEq/L (ref 19–32)
Chloride: 100 mEq/L (ref 96–112)
GFR calc non Af Amer: >90 mL/min (ref >90)
Potassium: 4.4 mEq/L (ref 3.5–5.1)
Sodium: 136 mEq/L (ref 135–145)
Total Bilirubin: 0.6 mg/dL (ref 0.3–1.2)

## 2011-01-06 LAB — GLUCOSE, POCT (MANUAL RESULT ENTRY): POC Glucose: 98

## 2011-01-06 LAB — CBC
Hemoglobin: 14 g/dL (ref 13.0–17.0)
MCH: 31.9 pg (ref 26.0–34.0)
MCHC: 33.3 g/dL (ref 30.0–36.0)
Platelets: 215 10*3/uL (ref 150–400)
RBC: 4.39 MIL/uL (ref 4.22–5.81)

## 2011-01-06 LAB — CARDIAC PANEL(CRET KIN+CKTOT+MB+TROPI)
CK, MB: 7.4 ng/mL (ref 0.3–4.0)
Total CK: 274 U/L — ABNORMAL HIGH (ref 7–232)
Troponin I: <0.3 ng/mL (ref <0.30)

## 2011-01-06 MED ORDER — ACETAMINOPHEN 325 MG PO TABS: 650.0000 mg | ORAL_TABLET | ORAL | Status: DC | PRN

## 2011-01-06 MED ORDER — SODIUM CHLORIDE 0.9 % IV SOLN
INTRAVENOUS | Status: DC
  Administered 2011-01-06: 23:00:00 via INTRAVENOUS

## 2011-01-06 NOTE — ED Notes (Signed)
Critical CKMB 7.4 reported to Dr. Patrica Duel, troponin negative

## 2011-01-06 NOTE — ED Notes (Signed)
Assumed care of patient, received from triage.  Patient initially went to urgent care for numbness on left side of body.  Patient states onset yesterday with numbness to left leg then could not feel if left foot was there.  Also with left arm numbness.  Patient reports also chronic pain in legs, patient with Fentanyl patch 50 mcg to left upper arm.

## 2011-01-06 NOTE — ED Notes (Signed)
Patient requesting water, ok by Dr. Patrica Duel, provided at this time.
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
## 2011-01-06 NOTE — ED Notes (Signed)
Patient to CT at this time

## 2011-01-06 NOTE — ED Notes (Signed)
MD at bedside. 

## 2011-01-06 NOTE — ED Provider Notes (Signed)
History     CSN: [id number] Arrival date & time: 01/06/2011  4:53 PM   None     Chief Complaint  Patient presents with  . Numbness  . Tingling    (Consider location/radiation/quality/duration/timing/severity/associated sxs/prior treatment) HPI Comments: Per pt and wife, 2 days ago had an episode of slurred speech for approx 20 minutes. Pt states thoughts were clear but had difficulty with speaking them. Then speech returned to normal. Went to sleep for the night and no further difficulty with speech. Yesterday morning had tingling in Lt hand and increased tingling in Lt foot from his chronic parasthesias. No HA, dizziness, visual changes, or  Weakness. States he feels his usual self today but presents for evaluation at the encouragement of his family.   Patient is a 55 y.o. male presenting with neurologic complaint.  Neurologic Problem The primary symptoms include paresthesias and speech change. Primary symptoms do not include headaches, syncope, loss of consciousness, altered mental status, dizziness, visual change, focal weakness, loss of sensation, nausea or vomiting. The symptoms began 2 days ago. The symptoms are resolved.  Change in speech began greater than 24 hours ago. Progression of speech change: resolved. Features of the speech change include inability to articulate (wife states words were garbled).  Additional symptoms do not include weakness, pain, loss of balance, tinnitus or vertigo. Medical issues also include hypertension. Medical issues do not include seizures, cerebral vascular accident, diabetes or recent surgery.    No past medical history on file.  No past surgical history on file.  No family history on file.  History  Substance Use Topics  . Smoking status: Current Everyday Smoker -- 0.5 packs/day    Types: Cigarettes  . Smokeless tobacco: Never Used  . Alcohol Use: Not on file      Review of Systems  HENT: Negative for tinnitus.   Cardiovascular:  Negative for syncope.  Gastrointestinal: Negative for nausea and vomiting.  Neurological: Positive for speech change and paresthesias. Negative for dizziness, vertigo, focal weakness, loss of consciousness, weakness, headaches and loss of balance.  Psychiatric/Behavioral: Negative for altered mental status.    Allergies  Penicillins  Home Medications   Current Outpatient Rx  Name Route Sig Dispense Refill  . AMITRIPTYLINE HCL 75 MG PO TABS Oral Take 2 tablets (150 mg total) by mouth at bedtime. 60 tablet 10  . ASPIRIN 81 MG PO TABS       . FENTANYL 50 MCG/HR TD PT72 Transdermal Place 1 patch (50 mcg total) onto the skin every other day. 15 patch 0    Fill 60 days after write date  . GABAPENTIN 800 MG PO TABS  TAKE 2 TABLETS EVERY MORNING AND TAKE 1 TABLET EVERY EVENING 270 tablet 3  . LISINOPRIL 20 MG PO TABS Oral Take 1 tablet (20 mg total) by mouth daily. 90 tablet 3  . LISINOPRIL-HYDROCHLOROTHIAZIDE 20-25 MG PO TABS  TAKE 1 TABLET BY MOUTH EVERY DAY WITH PLAIN LISINOPRIL 90 tablet 3  . METOPROLOL TARTRATE 25 MG PO TABS Oral Take 2 tablets (50 mg total) by mouth 2 (two) times daily. 120 tablet 10  . FENTANYL 50 MCG/HR TD PT72 Transdermal Place 1 patch (50 mcg total) onto the skin every other day. 15 patch 0  . FENTANYL 50 MCG/HR TD PT72 Transdermal Place 1 patch (50 mcg total) onto the skin every other day. 15 patch 0    Fill 90 days after written date  . FENTANYL 50 MCG/HR TD PT72 Transdermal Place 1 patch (  50 mcg total) onto the skin every other day. 15 patch 0    Fill 30 days after write date    BP 159/89  Pulse 70  Temp(Src) 99.2 F (37.3 C) (Oral)  Resp 20  SpO2 98%  Physical Exam  Nursing note and vitals reviewed. Constitutional: He appears well-developed and well-nourished. No distress.  HENT:  Head: Normocephalic and atraumatic.  Right Ear: Tympanic membrane, external ear and ear canal normal.  Left Ear: Tympanic membrane, external ear and ear canal normal.  Nose:  Nose normal.  Mouth/Throat: Oropharynx is clear and moist. No oropharyngeal exudate.  Eyes: Conjunctivae and EOM are normal. Pupils are equal, round, and reactive to light.  Neck: Neck supple.  Cardiovascular: Normal rate, regular rhythm and normal heart sounds.   Pulmonary/Chest: Effort normal and breath sounds normal. No respiratory distress.  Abdominal: Soft. Bowel sounds are normal. He exhibits no mass. There is no tenderness.  Lymphadenopathy:    He has no cervical adenopathy.  Neurological: He is alert.  Skin: Skin is warm and dry.  Psychiatric: He has a normal mood and affect.    ED Course  Procedures (including critical care time)  Labs Reviewed - No data to display No results found.   1. Tingling in extremities   2. Slurred speech       MDM  Pt transferred to ED for further eval.        Melody Comas, PA 01/07/11 1103

## 2011-01-06 NOTE — ED Notes (Signed)
Old and new EKG given to Dr. Patrica Duel.

## 2011-01-06 NOTE — ED Notes (Signed)
Transfer to ed by shuttle, report to [place], rn--triage
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
## 2011-01-06 NOTE — ED Provider Notes (Signed)
History     CSN: [id number] Arrival date & time: 01/06/2011  6:05 PM   First MD Initiated Contact with Patient 01/06/11 2015      Chief Complaint  Patient presents with  . Numbness    (Consider location/radiation/quality/duration/timing/severity/associated sxs/prior treatment) HPI  Past Medical History  Diagnosis Date  . Hypertension   . Hepatitis C virus     Past Surgical History  Procedure Date  . Leg surgery   . Hernia repair     History reviewed. No pertinent family history.  History  Substance Use Topics  . Smoking status: Current Everyday Smoker -- 0.5 packs/day    Types: Cigarettes  . Smokeless tobacco: Never Used  . Alcohol Use: No     former drinker      Review of Systems  Allergies  Penicillins  Home Medications   Current Outpatient Rx  Name Route Sig Dispense Refill  . AMITRIPTYLINE HCL 75 MG PO TABS Oral Take 2 tablets (150 mg total) by mouth at bedtime. 60 tablet 10  . ASPIRIN 81 MG PO TABS       . FENTANYL 50 MCG/HR TD PT72 Transdermal Place 1 patch (50 mcg total) onto the skin every other day. 15 patch 0  . FENTANYL 50 MCG/HR TD PT72 Transdermal Place 1 patch (50 mcg total) onto the skin every other day. 15 patch 0    Fill 90 days after written date  . FENTANYL 50 MCG/HR TD PT72 Transdermal Place 1 patch (50 mcg total) onto the skin every other day. 15 patch 0    Fill 60 days after write date  . FENTANYL 50 MCG/HR TD PT72 Transdermal Place 1 patch (50 mcg total) onto the skin every other day. 15 patch 0    Fill 30 days after write date  . GABAPENTIN 800 MG PO TABS  TAKE 2 TABLETS EVERY MORNING AND TAKE 1 TABLET EVERY EVENING 270 tablet 3  . LISINOPRIL 20 MG PO TABS Oral Take 1 tablet (20 mg total) by mouth daily. 90 tablet 3  . LISINOPRIL-HYDROCHLOROTHIAZIDE 20-25 MG PO TABS  TAKE 1 TABLET BY MOUTH EVERY DAY WITH PLAIN LISINOPRIL 90 tablet 3  . METOPROLOL TARTRATE 25 MG PO TABS Oral Take 2 tablets (50 mg total) by mouth 2 (two) times daily.  120 tablet 10    BP 124/88  Pulse 72  Temp(Src) 98.1 F (36.7 C) (Oral)  Resp 12  SpO2 95%  Physical Exam  ED Course  Procedures (including critical care time)  Labs Reviewed  CBC - Abnormal; Notable for the following:    WBC 10.6 (*)    All other components within normal limits  CARDIAC PANEL(CRET KIN+CKTOT+MB+TROPI) - Abnormal; Notable for the following:    Total CK 274 (*)    CK, MB 7.4 (*)    Relative Index 2.7 (*)    All other components within normal limits  BASIC METABOLIC PANEL  DRUGS OF ABUSE SCREEN W ALC, ROUTINE URINE   Ct Head Wo Contrast  01/06/2011  *RADIOLOGY REPORT*  Clinical Data: Left-sided weakness since yesterday with slight headache. Hepatitis C.  Tobacco use disorder.  Hypertension.  CT HEAD WITHOUT CONTRAST  Technique:  Contiguous axial images were obtained from the base of the skull through the vertex without contrast.  Comparison: None.  Findings: There is no evidence for acute infarction, intracranial hemorrhage, mass lesion, hydrocephalus, or extra-axial fluid. Premature atrophy is present for age 67.  Chronic microvascular ischemic change is noted in the periventricular and  subcortical white matter. Clear sinuses and mastoids.  Calvarium intact. Premature vascular calcification.  IMPRESSION: Premature atrophy and small vessel disease.  No acute intracranial findings.  No visible acute stroke or bleed.  Original Report Authenticated By: Elsie Stain, M.D.     1. TIA (transient ischemic attack)       MDM  Patient remained stable. Will go to the CDU on the TIA protocol.      No headache no visual changes. Tongue tied. Difficulty speaking. Dysarthria. Hard time moving his left lower extremity. TIA protocol.   Meggin Ola A. Patrica Duel, MD 01/06/11 2143
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
## 2011-01-07 ENCOUNTER — Other Ambulatory Visit (HOSPITAL_COMMUNITY): Payer: Medicare Other

## 2011-01-07 ENCOUNTER — Telehealth: Payer: Self-pay | Admitting: Family Medicine

## 2011-01-07 DIAGNOSIS — G459 Transient cerebral ischemic attack, unspecified: Secondary | ICD-10-CM

## 2011-01-07 LAB — RAPID URINE DRUG SCREEN, HOSP PERFORMED
Amphetamines: NOT DETECTED
Barbiturates: NOT DETECTED
Cocaine: NOT DETECTED
Opiates: NOT DETECTED
Tetrahydrocannabinol: POSITIVE — AB

## 2011-01-07 LAB — CARDIAC PANEL(CRET KIN+CKTOT+MB+TROPI): CK, MB: 6.2 ng/mL (ref 0.3–4.0)

## 2011-01-07 LAB — LIPID PANEL
Cholesterol: 148 mg/dL (ref 0–200)
Total CHOL/HDL Ratio: 4.2 RATIO
Triglycerides: 84 mg/dL (ref <150)
VLDL: 17 mg/dL (ref 0–40)

## 2011-01-07 LAB — URINALYSIS, ROUTINE W REFLEX MICROSCOPIC
Bilirubin Urine: NEGATIVE
Hgb urine dipstick: NEGATIVE
Nitrite: NEGATIVE
Protein, ur: NEGATIVE mg/dL
Specific Gravity, Urine: 1.014 (ref 1.005–1.030)
Urobilinogen, UA: 1 mg/dL (ref 0.0–1.0)

## 2011-01-07 LAB — HEMOGLOBIN A1C: Mean Plasma Glucose: 128 mg/dL — ABNORMAL HIGH (ref <117)

## 2011-01-07 NOTE — Progress Notes (Signed)
*  PRELIMINARY RESULTS*  Carotid Dopplers has been performed. Preliminary- No significant ICA stenosis bilaterally. Vertebral arteries are patent with antegrade flow.  Mila Homer 01/07/2011, 9:22 AM

## 2011-01-07 NOTE — Progress Notes (Signed)
  Echocardiogram 2D Echocardiogram has been performed.  Andre Sherman [name] 01/07/2011, 8:41 AM

## 2011-01-07 NOTE — ED Notes (Signed)
Pa in to discuss results and follow up care with patient

## 2011-01-07 NOTE — Telephone Encounter (Signed)
Called him at home after ED visit for possible TIA.  Feeling better but leg still numb.  Asked him to come in next week if not improving

## 2011-01-07 NOTE — ED Notes (Signed)
Sinus ryhthm observed on monitor. Pt is alert and oriented. He is aware he is going for further testing this morning and is agreeable. Neuro assessment completed and documented. Iv to saline lock for transportation and pt taking po well.Rip [place] per pa. He denies pain at this time

## 2011-01-07 NOTE — ED Provider Notes (Signed)
Pt in CDU on TIA protocol.  Reports that he was asymptomatic over night.  No complaints currently.  On exam, VSS, heart w/ RRR, lungs CTA and no focal neuro deficits.  7:45 AM   Carotid dopplers and trans-thoracic echo have been performed and no sig findings.  Results discussed w/ pt.  Will d/c pt home to f/u with neurologist and PCP.  Advised him to return if symptoms return/worsen. 11:38 AM   Otilio Miu, PA 01/07/11 [phone]

## 2011-01-07 NOTE — ED Provider Notes (Signed)
Medical screening examination/treatment/procedure(s) were performed by non-physician practitioner and as supervising physician I was immediately available for consultation/collaboration.  Tiegan Jambor, MD 01/07/11 1844 

## 2011-01-07 NOTE — ED Notes (Signed)
Iv discintinued..site asymptomatic

## 2011-01-07 NOTE — ED Provider Notes (Signed)
Medical screening examination/treatment/procedure(s) were performed by non-physician practitioner and as supervising physician I was immediately available for consultation/collaboration.  Raynald Blend, MD 01/07/11 1659

## 2011-01-07 NOTE — Progress Notes (Signed)
Review sent to EHR for Observation approval.
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
## 2011-01-08 ENCOUNTER — Encounter (HOSPITAL_COMMUNITY): Payer: Self-pay | Admitting: Emergency Medicine

## 2011-03-14 ENCOUNTER — Ambulatory Visit (INDEPENDENT_AMBULATORY_CARE_PROVIDER_SITE_OTHER): Payer: Medicare Other | Admitting: Family Medicine

## 2011-03-14 ENCOUNTER — Encounter: Payer: Self-pay | Admitting: Family Medicine

## 2011-03-14 VITALS — BP 118/71 | HR 78 | Temp 98.1°F | Ht 71.0 in | Wt 250.0 lb

## 2011-03-14 DIAGNOSIS — F17200 Nicotine dependence, unspecified, uncomplicated: Secondary | ICD-10-CM | POA: Diagnosis not present

## 2011-03-14 DIAGNOSIS — B1710 Acute hepatitis C without hepatic coma: Secondary | ICD-10-CM

## 2011-03-14 DIAGNOSIS — F3289 Other specified depressive episodes: Secondary | ICD-10-CM

## 2011-03-14 DIAGNOSIS — M79609 Pain in unspecified limb: Secondary | ICD-10-CM

## 2011-03-14 DIAGNOSIS — F329 Major depressive disorder, single episode, unspecified: Secondary | ICD-10-CM | POA: Diagnosis not present

## 2011-03-14 DIAGNOSIS — Z23 Encounter for immunization: Secondary | ICD-10-CM

## 2011-03-14 MED ORDER — FENTANYL 50 MCG/HR TD PT72: 1.0000 | MEDICATED_PATCH | TRANSDERMAL | Status: DC

## 2011-03-14 NOTE — Assessment & Plan Note (Signed)
No progress.  He voices he would like to work on this with our health coach

## 2011-03-14 NOTE — Assessment & Plan Note (Signed)
Stable.  He is using the patches appropriately.   Continue current regimen

## 2011-03-14 NOTE — Assessment & Plan Note (Signed)
Will refer to Hep C clinic.  He is finally agreeable to this and will continue to work on smoking cessation.

## 2011-03-14 NOTE — Progress Notes (Signed)
  Subjective:    Patient ID: Andre Sherman, male    DOB: 05/18/1955, 56 y.o.   MRN: [id number]  HPI  Leg Pain Continues about the same.  Worse with movements.Wearing compression hose regularly.  Swells during the day is better in the AM.  No skin breakdown.  No redness  Hypertension Does not check.  Taking his medications regularly.  Knows the name and times No chest pain or shortness of breath.  Leg swelling as above.  No lightheadedness.  Smoking Continues 1/2 ppd.  Sometimes more than others.  Was able to quit for about a week but stressful family events made him restart.  Wants to quit but really relaxes him.   Depression About the same.  Does have some problems getting to sleep if does not take amitriptylene.    No suicidal ideation.  Does not exercise regularly.  Would be interested in maybe group therapy  Hepatitis C No abdomen pain or jaundice.  Does agree now to referral   FH - strong FH of depression both sisters committed suicide  Review of Systems     Objective:   Physical Exam  Heart - Regular rate and rhythm.  No murmurs, gallops or rubs.    Lungs:  Normal respiratory effort, chest expands symmetrically. Lungs are clear to auscultation, no crackles or wheezes. Extremities:  No cyanosis, edema, or deformity noted with good range of motion of all major joints.         Assessment & Plan:

## 2011-03-14 NOTE — Patient Instructions (Signed)
I will refer your to Hep C clinic and let you know about the appointment  Stop smoking  Exercise 20 minutes every day  Cut back on high calorie foods - cut back on the sugar in the Koolaid or sugar free  I will ask Rosalita Chessman to contact you about the above   See me back in 4 months

## 2011-03-14 NOTE — Assessment & Plan Note (Signed)
Chronic dysthymia.  Hopefully meeting with health coach and if he can stop smoking and exercise regularly this might improve

## 2011-03-18 NOTE — Progress Notes (Signed)
Pt is interested and scheduled co-visit with health coach for 05/02/11 2:00 pm.

## 2011-04-14 ENCOUNTER — Telehealth: Payer: Self-pay | Admitting: Family Medicine

## 2011-04-14 NOTE — Telephone Encounter (Signed)
Called [employer] in respones to letter Mar 23 2011 about Fentanyl patches They responded it was covered for 15 patches per month and would not be a problem

## 2011-05-02 ENCOUNTER — Ambulatory Visit (INDEPENDENT_AMBULATORY_CARE_PROVIDER_SITE_OTHER): Payer: Medicare Other | Admitting: Family Medicine

## 2011-05-02 ENCOUNTER — Encounter: Payer: Self-pay | Admitting: Family Medicine

## 2011-05-02 VITALS — BP 105/64 | HR 98 | Temp 98.2°F | Ht 71.0 in | Wt 243.8 lb

## 2011-05-02 DIAGNOSIS — F17200 Nicotine dependence, unspecified, uncomplicated: Secondary | ICD-10-CM | POA: Diagnosis not present

## 2011-05-02 DIAGNOSIS — F329 Major depressive disorder, single episode, unspecified: Secondary | ICD-10-CM | POA: Diagnosis not present

## 2011-05-02 DIAGNOSIS — F3289 Other specified depressive episodes: Secondary | ICD-10-CM | POA: Diagnosis not present

## 2011-05-02 DIAGNOSIS — I10 Essential (primary) hypertension: Secondary | ICD-10-CM

## 2011-05-02 NOTE — Progress Notes (Signed)
Subjective:     Patient ID: Andre Sherman, male   DOB: 1955-04-01, 56 y.o.   MRN: [id number]  HPI HYPERTENSION Disease Monitoring Home BP Monitoring not doing Chest pain- no     Dyspnea-  no  Medications Compliance: taking as prescribed but did start taking lisinopril alone tablet in PM and combination lisinopril in the am. Lightheadedness-  no  Edema-  no   ROS - See HPI  PMH Lab Review   Potassium  Date Value Range Status  01/06/2011 4.4  3.5-5.1 (mEq/L) Final     Sodium  Date Value Range Status  01/06/2011 136  135-145 (mEq/L) Final        TOBACCO DEPENDENCE The patient was counseled on the dangers of tobacco use, and was advised to quit and created plan of action to stop smoking..  Reviewed strategies to maximize success, including stress management, substitution of other forms of reinforcement and support of family/friends, [employer].  Patient Identified Concern:  Smoking cessation, stress management Stage of Change Patient Is In:  Contemplation- pt planning on making changes in the next 6 months.  Patient Reported Barriers:  Smokes when stressed, habit of smoking when first wakes up and after eating. Patient Reported Perceived Benefits:  Pt believe quitting smoking will be beneficial for his health.  Patient Reports Self-Efficacy:   Pt displays some self-efficacy, was not able to demonstrate successes in the past.  Behavior Change Supports:  Wife wants to quit, 1-800 quit-[place] Goals:  Pt is going to contact [facility] [place] to obtain nicotine patches and to enroll in their program.  He will also keep up with weekly health coaching phone calls.  Pt plans on walking the dog to help avoid smoking.  More strategies will be determined as we continue to work together.  Patient Education:  We talked about stress management and strategies to quit smoking such as reduce amount, nicotine patches, substituting smoking behavior with exercise.   Review of Systems     Objective:   Physical  Exam No acute distress    Assessment:         Plan:

## 2011-05-02 NOTE — Patient Instructions (Signed)
1. Contact [employer] [place] to enroll in program and obtain nicotine patches. 2. Start substituting smoking with other behaviors like walking your dogs. 3. Continue to take medications as prescribed.  4. Monitor blood pressure and follow up with Dr. Deirdre Priest in 4 weeks.  5. Call Rosalita Chessman weekly for follow up on goals.

## 2011-05-02 NOTE — Assessment & Plan Note (Signed)
Assessment:  No improvement.  Reports smoking a pack a day.  Plan:  Contact [employer] to enroll in their program and obtain nicotine patches.  Start to substitute smoking with other behaviors such as exercising.  On going support from [employer] with weekly phone calls.

## 2011-05-03 NOTE — Assessment & Plan Note (Signed)
Improved control. Continue current medications. 

## 2011-05-03 NOTE — Assessment & Plan Note (Signed)
He has not found a counselor yet but relates he knows he needs to and will start lookinng

## 2011-05-05 ENCOUNTER — Telehealth: Payer: Self-pay | Admitting: Home Health Services

## 2011-05-05 NOTE — Telephone Encounter (Signed)
Spoke with Andre Sherman.  Pt reports not feeling well or motivated right now.    Pt expressed need for counseling, gave him references to 3 different clinic that accept his insurance.   Pt has not contacted [facility] for nicotine patches yet.  Pt set goal to contact the counseling clinics to see if he can establish himself. I will follow up with him next week for accountability.  Pt's overall goal is smoking cessation, wt loss.

## 2011-05-12 ENCOUNTER — Telehealth: Payer: Self-pay | Admitting: Home Health Services

## 2011-05-12 NOTE — Telephone Encounter (Signed)
Spoke with Fayrene Fearing.  Pt reports an okay week.  Pt reports taking all medications as prescribed.  He does struggle with remembering but has not missed any days this past week.  Pt accomplished goal of calling counseling resources and has decided that [facility] ([facility] is the best option for him.  He also called [facility] and they are sending him materials.  Pt was reflective and shared that while he has many struggles he is grateful for the medical help he has gotten since getting shot.  Pt set goal to physically go to [place] this week and get himself established.    Pt's overall goal is wt loss, smoking cessation, stress management.

## 2011-06-03 ENCOUNTER — Telehealth: Payer: Self-pay | Admitting: Home Health Services
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
## 2011-06-24 ENCOUNTER — Telehealth: Payer: Self-pay | Admitting: Home Health Services
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
## 2011-06-29 ENCOUNTER — Ambulatory Visit: Payer: Medicare Other | Admitting: Family Medicine

## 2011-07-04 ENCOUNTER — Encounter: Payer: Self-pay | Admitting: Family Medicine

## 2011-07-04 ENCOUNTER — Ambulatory Visit (INDEPENDENT_AMBULATORY_CARE_PROVIDER_SITE_OTHER): Payer: Medicare Other | Admitting: Family Medicine

## 2011-07-04 VITALS — BP 128/84 | HR 75 | Temp 98.5°F | Ht 71.0 in | Wt 243.5 lb

## 2011-07-04 DIAGNOSIS — F329 Major depressive disorder, single episode, unspecified: Secondary | ICD-10-CM

## 2011-07-04 DIAGNOSIS — M79609 Pain in unspecified limb: Secondary | ICD-10-CM | POA: Diagnosis not present

## 2011-07-04 DIAGNOSIS — F3289 Other specified depressive episodes: Secondary | ICD-10-CM | POA: Diagnosis not present

## 2011-07-04 DIAGNOSIS — F17200 Nicotine dependence, unspecified, uncomplicated: Secondary | ICD-10-CM | POA: Diagnosis not present

## 2011-07-04 MED ORDER — FENTANYL 50 MCG/HR TD PT72: 1.0000 | MEDICATED_PATCH | TRANSDERMAL | Status: DC

## 2011-07-04 NOTE — Assessment & Plan Note (Signed)
Unchanged.   He will investigate over ride to get more patches or we will consider oral medications.   Continue elavil and neurontin
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
## 2011-07-04 NOTE — Progress Notes (Signed)
  Subjective:    Patient ID: Andre Sherman, male    DOB: 1955-03-25, 56 y.o.   MRN: [id number]  HPI   TOBACCO DEPENDENCE The patient was counseled on the dangers of tobacco use, and was advised to quit and referred to a tobacco cessation program.  Reviewed strategies to maximize success, including removing cigarettes and smoking materials from environment, stress management, substitution of other forms of reinforcement, support of family/friends and local smoking cessation programs ([facility]).  Patient Identified Concern:  Smoking cessation Stage of Change Patient Is In:  Contemplation-will make changes in next 6 months Patient Reported Barriers:  Habit, addiction, stress managment Patient Reported Perceived Benefits:  Feel better, get treatment for HEP C Patient Reports Self-Efficacy:   Pt displays some self-efficacy.  Did not identify any past successes with tobacco cessation. Behavior Change Supports:  [facility], health coach, wife, Doctor Goals:  To set a quit smoking date with his wife by 07/14/2011 Patient Education:  We talked about strategies to help with hand to mouth behavior with smoking.  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  We also talked about ways to deal with stress without cigarettes.     Review of Systems     Objective:   Physical Exam        Assessment & Plan:

## 2011-07-04 NOTE — Patient Instructions (Signed)
  Check with the drug store as to how you can get 15 patches per months - overide?  To get you to the Hep C center you need to be seeing the [facility]   Good luck with the stopping smoking

## 2011-07-04 NOTE — Assessment & Plan Note (Signed)
Unchanged.  Encouraged counseling and exercise

## 2011-07-04 NOTE — Assessment & Plan Note (Addendum)
Assessment:  Pt displays motivation for change.  Contacted [facility] and received nicotine patches. Pt currenlty smokes 1 pack a day.  Plan:  Continued health coaching, using of [facility] help line.  Set quit date by 07/14/11.

## 2011-07-12 ENCOUNTER — Telehealth: Payer: Self-pay | Admitting: *Deleted

## 2011-07-12 NOTE — Telephone Encounter (Signed)
PA required for Fentanyl patches. Form placed in MD box.

## 2011-07-14 NOTE — Telephone Encounter (Signed)
Approval received from insurance.  Pharmacy notified. 

## 2011-07-15 ENCOUNTER — Telehealth: Payer: Self-pay | Admitting: Home Health Services

## 2011-07-15 NOTE — Telephone Encounter (Signed)
Spoke with Andre Sherman.  Pt reports taking medications as prescribed.  Pt not self monitoring blood pressure.  Pt reports still waiting for wife's nicotine patches to come so they can set quit smoking date.  Pt recognizes that he smoke 6-7 cigarettes during the night while watching TV.  Pt and wife have come up with strategies to not watch TV and play board/card games together as a family.  Pt will contact me next Thursday with quit smoking date.  Pt's overall goal is wt loss, smoking cessation, bp management, stress management.

## 2011-07-16 ENCOUNTER — Other Ambulatory Visit: Payer: Self-pay | Admitting: Family Medicine

## 2011-07-21 ENCOUNTER — Ambulatory Visit (INDEPENDENT_AMBULATORY_CARE_PROVIDER_SITE_OTHER): Payer: Medicare Other | Admitting: Gastroenterology

## 2011-07-21 ENCOUNTER — Telehealth: Payer: Self-pay | Admitting: Home Health Services

## 2011-07-21 DIAGNOSIS — D649 Anemia, unspecified: Secondary | ICD-10-CM

## 2011-07-21 DIAGNOSIS — B182 Chronic viral hepatitis C: Secondary | ICD-10-CM | POA: Diagnosis not present

## 2011-07-21 NOTE — Telephone Encounter (Signed)
Spoke with Andre Sherman.  Pt reports taking medications as prescribed without missing any days.  Pt is not self-monitoring blood pressure.  Pt set quit date for smoking- Friday Jul 22, 2011.  Pt has several stategies such as nicotine patches, wood working, games to distract himself.  Pt admits his weakness is during the night while watching TV.    Pt is concerned about [facility]  We talked about his concern and talked about possible outcomes from this visit.  Informed patient that if he is given instructions, to call me and we can work through them.    Pt reported "having problems" right now, but was not specific.  Pt reflected feeling stress and concern with family right now.   Pt agreed that he would call me after his appointment today.  Pt's overall goal is smoking cessation, wt loss, ht management, stress reduction.

## 2011-07-22 LAB — CBC WITH DIFFERENTIAL/PLATELET
Basophils Absolute: 0.1 10*3/uL (ref 0.0–0.1)
Basophils Relative: 1 % (ref 0–1)
Eosinophils Relative: 12 % — ABNORMAL HIGH (ref 0–5)
HCT: 44.7 % (ref 39.0–52.0)
Hemoglobin: 15.6 g/dL (ref 13.0–17.0)
MCHC: 34.9 g/dL (ref 30.0–36.0)
MCV: 92.7 fL (ref 78.0–100.0)
Monocytes Absolute: 0.6 10*3/uL (ref 0.1–1.0)
Monocytes Relative: 9 % (ref 3–12)
RDW: 13.1 % (ref 11.5–15.5)

## 2011-07-22 LAB — COMPLETE METABOLIC PANEL WITH GFR
AST: 42 U/L — ABNORMAL HIGH (ref 0–37)
Albumin: 4.3 g/dL (ref 3.5–5.2)
Alkaline Phosphatase: 50 U/L (ref 39–117)
BUN: 20 mg/dL (ref 6–23)
Calcium: 9.9 mg/dL (ref 8.4–10.5)
Chloride: 102 mEq/L (ref 96–112)
Creat: 0.96 mg/dL (ref 0.50–1.35)
GFR, Est Non African American: 89 mL/min
Glucose, Bld: 110 mg/dL — ABNORMAL HIGH (ref 70–99)
Potassium: 4.9 mEq/L (ref 3.5–5.3)

## 2011-07-22 LAB — FERRITIN: Ferritin: 199 ng/mL (ref 22–322)

## 2011-07-22 LAB — AFP TUMOR MARKER: AFP-Tumor Marker: 5.7 ng/mL (ref 0.0–8.0)

## 2011-07-22 LAB — IBC PANEL
%SAT: 26 % (ref 20–55)
TIBC: 378 ug/dL (ref 215–435)
UIBC: 279 ug/dL (ref 125–400)

## 2011-07-22 LAB — TSH: TSH: 3.774 u[IU]/mL (ref 0.350–4.500)

## 2011-07-22 LAB — HEPATITIS B SURFACE ANTIGEN: Hepatitis B Surface Ag: NEGATIVE

## 2011-07-22 LAB — HEPATITIS A ANTIBODY, TOTAL: Hep A Total Ab: POSITIVE — AB

## 2011-07-28 LAB — HEPATITIS C GENOTYPE
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
## 2011-08-18 ENCOUNTER — Telehealth: Payer: Self-pay | Admitting: Home Health Services

## 2011-08-18 NOTE — Telephone Encounter (Signed)
Spoke with Andre Sherman.  Pt reports no change in smoking status.  Pt tried 2 separate days to quit with patch and reports about 6 hours of cessation.  Pt reported feelings of stress to cause him to smoke.  Pt recognizes that he must find something to do to keep busy to break habit.  Wife is no longer interested in quitting tobacco at this time.  Pt is still interested in quitting but reports low self-efficacy.  Pt reports taking medications as prescribed. Pt reports home measurement of BP at 128/70.  Feels okay other than stressful events at him home.  Pt schedule fu visit with health coach/PCP on 08/29/11.  Will reassess behavior changes at that visit.  Pt's overall goal is weight loss, smoking cessation, htn management.

## 2011-08-29 ENCOUNTER — Encounter: Payer: Self-pay | Admitting: Family Medicine

## 2011-08-29 ENCOUNTER — Ambulatory Visit (INDEPENDENT_AMBULATORY_CARE_PROVIDER_SITE_OTHER): Payer: Medicare Other | Admitting: Family Medicine

## 2011-08-29 VITALS — BP 111/76 | HR 98 | Temp 98.1°F | Ht 71.0 in | Wt 240.0 lb

## 2011-08-29 DIAGNOSIS — I10 Essential (primary) hypertension: Secondary | ICD-10-CM | POA: Diagnosis not present

## 2011-08-29 DIAGNOSIS — F17200 Nicotine dependence, unspecified, uncomplicated: Secondary | ICD-10-CM | POA: Diagnosis not present

## 2011-08-29 MED ORDER — FENTANYL 50 MCG/HR TD PT72: 1.0000 | MEDICATED_PATCH | TRANSDERMAL | Status: DC

## 2011-08-29 NOTE — Assessment & Plan Note (Signed)
Assessment: Some improvement, pt reports smoking less daily (.5 pack vs 1 pack)  Pt tried nicotine patches for 2 days and didn't feel like he could be successful.  Plan: Pt will contact health coach when he is ready to quit smoking.  Pt set goal to reduce from 10 cigarettes to 6 cigarettes daily.

## 2011-08-29 NOTE — Progress Notes (Signed)
  Subjective:    Patient ID: Andre Sherman, male    DOB: Jan 20, 1956, 56 y.o.   MRN: [id number]     HPI  HYPERTENSION Disease Monitoring  Blood pressure range-not monitoring at home  Chest pain- no  Dyspnea- no Medications Compliance- pt reports taking medications daily without missing any days Lightheadedness- no  Edema- no  Reviewed patient completed questionaire and above is correct   SMOKING Ready to quit: No Counseling given: Yes The patient was counseled on the dangers of tobacco use, and was advised to quit and reluctant to quit.  Reviewed strategies to maximize success, including removing cigarettes and smoking materials from environment, stress management and substitution of other forms of reinforcement.  Review of Symptoms - see HPI  PMH - Smoking status noted.     Review of Systems     Objective:   Physical Exam  No acute distress.       Assessment & Plan:

## 2011-08-29 NOTE — Patient Instructions (Addendum)
Follow up with Dr. Deirdre Priest in 4 months. Contact Rosalita Chessman when you feel you need support for behavior changes. [phone] Consider quitting smoking or reducing to 6 cigarettes a day.  Consider scheduling an appointment with [facility] counseling.

## 2011-08-30 NOTE — Assessment & Plan Note (Signed)
Well controlled on current medications.  Recent Cmet -normal

## 2011-09-10 ENCOUNTER — Other Ambulatory Visit: Payer: Self-pay | Admitting: Family Medicine

## 2011-09-16 ENCOUNTER — Emergency Department (INDEPENDENT_AMBULATORY_CARE_PROVIDER_SITE_OTHER)
Admission: EM | Admit: 2011-09-16 | Discharge: 2011-09-16 | Disposition: A | Discharge status: Home / Self Care | Payer: Medicare Other | Source: Home / Self Care | Attending: Emergency Medicine | Admitting: Emergency Medicine

## 2011-09-16 ENCOUNTER — Encounter (HOSPITAL_COMMUNITY): Payer: Self-pay | Admitting: Emergency Medicine

## 2011-09-16 DIAGNOSIS — L0390 Cellulitis, unspecified: Secondary | ICD-10-CM

## 2011-09-16 DIAGNOSIS — IMO0002 Reserved for concepts with insufficient information to code with codable children: Secondary | ICD-10-CM

## 2011-09-16 DIAGNOSIS — L089 Local infection of the skin and subcutaneous tissue, unspecified: Secondary | ICD-10-CM

## 2011-09-16 MED ORDER — CEPHALEXIN 500 MG PO CAPS: 500.0000 mg | ORAL_CAPSULE | Freq: Four times a day (QID) | ORAL | Status: AC

## 2011-09-16 MED ORDER — SULFAMETHOXAZOLE-TRIMETHOPRIM 800-160 MG PO TABS: 1.0000 | ORAL_TABLET | Freq: Two times a day (BID) | ORAL | Status: AC

## 2011-09-16 MED ORDER — HYDROCODONE-ACETAMINOPHEN 5-325 MG PO TABS: 1.0000 | ORAL_TABLET | Freq: Four times a day (QID) | ORAL | Status: AC | PRN

## 2011-09-16 NOTE — ED Provider Notes (Signed)
Medical screening examination/treatment/procedure(s) were performed by non-physician practitioner and as supervising physician I was immediately available for consultation/collaboration.  Leslee Home, M.D.   Reuben Likes, MD 09/16/11 2104
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
## 2011-10-04 ENCOUNTER — Ambulatory Visit (INDEPENDENT_AMBULATORY_CARE_PROVIDER_SITE_OTHER): Payer: Medicare Other | Admitting: Family Medicine

## 2011-10-04 VITALS — BP 120/72 | HR 84 | Temp 98.1°F | Wt 242.4 lb

## 2011-10-04 DIAGNOSIS — L049 Acute lymphadenitis, unspecified: Secondary | ICD-10-CM | POA: Diagnosis not present

## 2011-10-04 DIAGNOSIS — IMO0002 Reserved for concepts with insufficient information to code with codable children: Secondary | ICD-10-CM | POA: Diagnosis not present

## 2011-10-04 DIAGNOSIS — L0291 Cutaneous abscess, unspecified: Secondary | ICD-10-CM | POA: Diagnosis not present

## 2011-10-04 MED ORDER — AMOXICILLIN-POT CLAVULANATE 875-125 MG PO TABS: 1.0000 | ORAL_TABLET | Freq: Two times a day (BID) | ORAL | Status: AC
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
## 2011-10-04 NOTE — Patient Instructions (Signed)
It was good to see you today! I have sent in the medicine Augmentin for your arm pit.  Take it two times per day for 7 days. Come back to see me in 1 week.  You can cancel your appointment if everything is back to normal.

## 2011-10-12 ENCOUNTER — Ambulatory Visit (INDEPENDENT_AMBULATORY_CARE_PROVIDER_SITE_OTHER): Payer: Medicare Other | Admitting: Family Medicine

## 2011-10-12 ENCOUNTER — Encounter: Payer: Self-pay | Admitting: Family Medicine

## 2011-10-12 VITALS — BP 124/77 | HR 86 | Temp 100.0°F | Ht 70.0 in | Wt 240.0 lb

## 2011-10-12 DIAGNOSIS — L03119 Cellulitis of unspecified part of limb: Secondary | ICD-10-CM

## 2011-10-12 DIAGNOSIS — IMO0002 Reserved for concepts with insufficient information to code with codable children: Secondary | ICD-10-CM | POA: Diagnosis not present

## 2011-10-12 DIAGNOSIS — R590 Localized enlarged lymph nodes: Secondary | ICD-10-CM | POA: Insufficient documentation

## 2011-10-12 DIAGNOSIS — L02419 Cutaneous abscess of limb, unspecified: Secondary | ICD-10-CM | POA: Diagnosis not present

## 2011-10-12 DIAGNOSIS — R599 Enlarged lymph nodes, unspecified: Secondary | ICD-10-CM

## 2011-10-12 DIAGNOSIS — L02416 Cutaneous abscess of left lower limb: Secondary | ICD-10-CM | POA: Insufficient documentation

## 2011-10-12 DIAGNOSIS — L0291 Cutaneous abscess, unspecified: Secondary | ICD-10-CM | POA: Diagnosis not present

## 2011-10-12 DIAGNOSIS — L049 Acute lymphadenitis, unspecified: Secondary | ICD-10-CM | POA: Diagnosis not present

## 2011-10-12 MED ORDER — SULFAMETHOXAZOLE-TMP DS 800-160 MG PO TABS: 1.0000 | ORAL_TABLET | Freq: Two times a day (BID) | ORAL | Status: AC

## 2011-10-12 MED ORDER — CEPHALEXIN 500 MG PO CAPS: 500.0000 mg | ORAL_CAPSULE | Freq: Four times a day (QID) | ORAL | Status: DC

## 2011-10-12 NOTE — Progress Notes (Signed)
Patient ID: JAYREN CEASE, male   DOB: [birth year], 56 y.o.   MRN: [id number] Subjective: The patient is a 56 y.o. year old male who presents today for f/u.  1. LE cellulitis: Patient finished abx for left axillary adenitis about 3 days ago.  Has had recurrance of symptoms in leg now with LLE pain, swelling, erythema, warmth, and fevers/chills.  No n/v/d.  2. L-axillary swelling: Has gone down some since last appointment.  Not painful anymore.  Patient says he doesn't really notice it.  No pain, swelling, or any other bumps elsewhere.  Patient's past medical, social, and family history were reviewed and updated as appropriate. History  Substance Use Topics  . Smoking status: Current Everyday Smoker -- 0.5 packs/day    Types: Cigarettes  . Smokeless tobacco: Never Used   Comment: he and wife are planing to quit when she gets her patches  . Alcohol Use: No     former drinker   Objective:  Filed Vitals:   10/12/11 1559  BP: 124/77  Pulse: 86  Temp: 100 F (37.8 C)   Gen: NAD Axilla: Now with a 1x2 cm swollen node in left axilla, no other adenopathy.  No fluctuance or overlying erythema.  Node is non-tender. Legs: LLE is erythematous, warm, and painful to touch.  There is some swelling.  Pulses are preserved.  RLE is normal with no evidence of infection  Assessment/Plan:  Please also see individual problems in problem list for problem-specific plans.

## 2011-10-12 NOTE — Assessment & Plan Note (Signed)
Improved s/p treatment with augmentin.  If not completely better within another 1-2 weeks would plan [place] to rule out any malignancy.  Currently does not have any areas of fluctuance that I would be able to drain.  No evidence of active infection in this area.

## 2011-10-12 NOTE — Assessment & Plan Note (Signed)
New onset of cellulitis of left leg.  Patient has had problems with this in the past, last treated with Keflex and Bactrim in mid July.  This completely resolved his symptoms.  He does not have any evidence of systemic infection.  Will treat, again, with Keflex and Bactrim as patient has poor circulation and needs good coverage for both staph and strep plus MRSA.  Patient instructed to call for work in appointment Friday if not getting better.  Otherwise, f/u with PCP in 1 week.

## 2011-10-12 NOTE — Patient Instructions (Signed)
I am giving you two antibiotics for your cellulitis.  If you are not feeling better by Friday morning, call in and ask for a same day appointment with me.  i will let the front desk staff know that if you call, I will work you in. I want you to make an appointment with Dr. Deirdre Priest in about 1 week. We will continue to watch the swelling in your arm pit.  If it is not basically all gone within another 2-3 weeks we will need to get an ultrasound.

## 2011-11-23 DIAGNOSIS — Z23 Encounter for immunization: Secondary | ICD-10-CM | POA: Diagnosis not present

## 2011-12-28 ENCOUNTER — Encounter: Payer: Self-pay | Admitting: Family Medicine

## 2011-12-28 ENCOUNTER — Ambulatory Visit (INDEPENDENT_AMBULATORY_CARE_PROVIDER_SITE_OTHER): Payer: Medicare Other | Admitting: Family Medicine

## 2011-12-28 ENCOUNTER — Other Ambulatory Visit: Payer: Self-pay | Admitting: Family Medicine

## 2011-12-28 VITALS — BP 126/85 | HR 71 | Temp 98.1°F | Ht 70.0 in | Wt 242.0 lb

## 2011-12-28 DIAGNOSIS — F329 Major depressive disorder, single episode, unspecified: Secondary | ICD-10-CM

## 2011-12-28 DIAGNOSIS — L0291 Cutaneous abscess, unspecified: Secondary | ICD-10-CM | POA: Diagnosis not present

## 2011-12-28 DIAGNOSIS — F17200 Nicotine dependence, unspecified, uncomplicated: Secondary | ICD-10-CM | POA: Diagnosis not present

## 2011-12-28 DIAGNOSIS — L049 Acute lymphadenitis, unspecified: Secondary | ICD-10-CM | POA: Diagnosis not present

## 2011-12-28 DIAGNOSIS — I10 Essential (primary) hypertension: Secondary | ICD-10-CM | POA: Diagnosis not present

## 2011-12-28 DIAGNOSIS — L02419 Cutaneous abscess of limb, unspecified: Secondary | ICD-10-CM | POA: Diagnosis not present

## 2011-12-28 DIAGNOSIS — IMO0002 Reserved for concepts with insufficient information to code with codable children: Secondary | ICD-10-CM | POA: Diagnosis not present

## 2011-12-28 DIAGNOSIS — M79609 Pain in unspecified limb: Secondary | ICD-10-CM

## 2011-12-28 DIAGNOSIS — R599 Enlarged lymph nodes, unspecified: Secondary | ICD-10-CM | POA: Diagnosis not present

## 2011-12-28 DIAGNOSIS — F3289 Other specified depressive episodes: Secondary | ICD-10-CM | POA: Diagnosis not present

## 2011-12-28 DIAGNOSIS — B1710 Acute hepatitis C without hepatic coma: Secondary | ICD-10-CM

## 2011-12-28 MED ORDER — FENTANYL 50 MCG/HR TD PT72: 1.0000 | MEDICATED_PATCH | TRANSDERMAL | Status: DC

## 2011-12-28 NOTE — Assessment & Plan Note (Signed)
Well-controlled without side effects.   Continue current regimen.

## 2011-12-28 NOTE — Assessment & Plan Note (Signed)
No improvement.  Does not seem to be nicotine dependence as much as habit.  It is up to him to find ways to approach this.  We discussed several options

## 2011-12-28 NOTE — Assessment & Plan Note (Signed)
Stable with a chronic type dysthymia.  Encouraged exercise.  Continue elavil

## 2011-12-28 NOTE — Assessment & Plan Note (Signed)
Encouraged him to follow up with Hep C clinic as scheduled

## 2011-12-28 NOTE — Assessment & Plan Note (Signed)
Stable using compression stockings and fentanyl patches and adjuvant pain medications.  Continue current regimen.

## 2011-12-28 NOTE — Patient Instructions (Addendum)
Contact Dr Jacqualine Mau office to follow up on Hepatitis C treatment  [phone]  See you back in 4 months  Good work on the blood pressure!   Your main health goal is to stop smoking - come up with substitutes and plans such as not smoking inside or eating low calorie food (carrots celery) or sugar free candy instead of smoking
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
## 2012-03-06 DIAGNOSIS — B182 Chronic viral hepatitis C: Secondary | ICD-10-CM | POA: Diagnosis not present

## 2012-03-15 ENCOUNTER — Other Ambulatory Visit: Payer: Self-pay | Admitting: Internal Medicine

## 2012-03-15 DIAGNOSIS — B1920 Unspecified viral hepatitis C without hepatic coma: Secondary | ICD-10-CM

## 2012-04-30 ENCOUNTER — Ambulatory Visit: Payer: Medicare Other | Admitting: Family Medicine

## 2012-05-01 ENCOUNTER — Telehealth: Payer: Self-pay | Admitting: Family Medicine

## 2012-05-01 NOTE — Telephone Encounter (Signed)
[name] called about getting refill on his pain med prior to new appt on 3/19 due to office closing yesterday.  Have sched him for 11:00 on 3/19.  Please have someone call him when he can pick up rx.

## 2012-05-03 MED ORDER — FENTANYL 50 MCG/HR TD PT72: 1.0000 | MEDICATED_PATCH | TRANSDERMAL | Status: DC

## 2012-05-03 NOTE — Telephone Encounter (Signed)
Please notify him is up front to pick up  Thanks  LC

## 2012-05-16 ENCOUNTER — Ambulatory Visit (INDEPENDENT_AMBULATORY_CARE_PROVIDER_SITE_OTHER): Payer: Medicare Other | Admitting: Family Medicine

## 2012-05-16 ENCOUNTER — Encounter: Payer: Self-pay | Admitting: Family Medicine

## 2012-05-16 VITALS — BP 123/78 | HR 79 | Temp 98.2°F | Ht 70.0 in | Wt 252.0 lb

## 2012-05-16 DIAGNOSIS — R599 Enlarged lymph nodes, unspecified: Secondary | ICD-10-CM | POA: Diagnosis not present

## 2012-05-16 DIAGNOSIS — M79609 Pain in unspecified limb: Secondary | ICD-10-CM | POA: Diagnosis not present

## 2012-05-16 DIAGNOSIS — I10 Essential (primary) hypertension: Secondary | ICD-10-CM

## 2012-05-16 DIAGNOSIS — L0291 Cutaneous abscess, unspecified: Secondary | ICD-10-CM | POA: Diagnosis not present

## 2012-05-16 DIAGNOSIS — F3289 Other specified depressive episodes: Secondary | ICD-10-CM | POA: Diagnosis not present

## 2012-05-16 DIAGNOSIS — F17200 Nicotine dependence, unspecified, uncomplicated: Secondary | ICD-10-CM

## 2012-05-16 DIAGNOSIS — F329 Major depressive disorder, single episode, unspecified: Secondary | ICD-10-CM | POA: Diagnosis not present

## 2012-05-16 DIAGNOSIS — IMO0002 Reserved for concepts with insufficient information to code with codable children: Secondary | ICD-10-CM | POA: Diagnosis not present

## 2012-05-16 DIAGNOSIS — L049 Acute lymphadenitis, unspecified: Secondary | ICD-10-CM | POA: Diagnosis not present

## 2012-05-16 DIAGNOSIS — B1710 Acute hepatitis C without hepatic coma: Secondary | ICD-10-CM | POA: Diagnosis not present

## 2012-05-16 DIAGNOSIS — L02419 Cutaneous abscess of limb, unspecified: Secondary | ICD-10-CM | POA: Diagnosis not present

## 2012-05-16 MED ORDER — FENTANYL 50 MCG/HR TD PT72: 1.0000 | MEDICATED_PATCH | TRANSDERMAL | Status: DC

## 2012-05-16 NOTE — Patient Instructions (Addendum)
Come back in and see me in June or July  Everything is going great except for the smoking - That is your main health goal  Have a good spring
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
## 2012-05-17 NOTE — Assessment & Plan Note (Signed)
Contemplative.   Will refer to Health Coach

## 2012-05-17 NOTE — Assessment & Plan Note (Signed)
Well controlled continue current medications  
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
## 2012-06-14 DIAGNOSIS — B182 Chronic viral hepatitis C: Secondary | ICD-10-CM | POA: Diagnosis not present

## 2012-08-03 ENCOUNTER — Ambulatory Visit (INDEPENDENT_AMBULATORY_CARE_PROVIDER_SITE_OTHER): Payer: Medicare Other | Admitting: Family Medicine

## 2012-08-03 VITALS — BP 107/71 | HR 93 | Temp 98.0°F | Wt 231.0 lb

## 2012-08-03 DIAGNOSIS — F329 Major depressive disorder, single episode, unspecified: Secondary | ICD-10-CM | POA: Diagnosis not present

## 2012-08-03 DIAGNOSIS — I10 Essential (primary) hypertension: Secondary | ICD-10-CM | POA: Diagnosis not present

## 2012-08-03 DIAGNOSIS — M79609 Pain in unspecified limb: Secondary | ICD-10-CM | POA: Diagnosis not present

## 2012-08-03 DIAGNOSIS — F17200 Nicotine dependence, unspecified, uncomplicated: Secondary | ICD-10-CM | POA: Diagnosis not present

## 2012-08-03 DIAGNOSIS — L0291 Cutaneous abscess, unspecified: Secondary | ICD-10-CM | POA: Diagnosis not present

## 2012-08-03 DIAGNOSIS — L03116 Cellulitis of left lower limb: Secondary | ICD-10-CM

## 2012-08-03 DIAGNOSIS — L02419 Cutaneous abscess of limb, unspecified: Secondary | ICD-10-CM | POA: Diagnosis not present

## 2012-08-03 DIAGNOSIS — R599 Enlarged lymph nodes, unspecified: Secondary | ICD-10-CM | POA: Diagnosis not present

## 2012-08-03 DIAGNOSIS — B1710 Acute hepatitis C without hepatic coma: Secondary | ICD-10-CM | POA: Diagnosis not present

## 2012-08-03 DIAGNOSIS — F3289 Other specified depressive episodes: Secondary | ICD-10-CM | POA: Diagnosis not present

## 2012-08-03 DIAGNOSIS — IMO0002 Reserved for concepts with insufficient information to code with codable children: Secondary | ICD-10-CM | POA: Diagnosis not present

## 2012-08-03 DIAGNOSIS — L049 Acute lymphadenitis, unspecified: Secondary | ICD-10-CM | POA: Diagnosis not present

## 2012-08-03 MED ORDER — CEPHALEXIN 500 MG PO CAPS: 500.0000 mg | ORAL_CAPSULE | Freq: Four times a day (QID) | ORAL | Status: AC

## 2012-08-03 MED ORDER — FENTANYL 50 MCG/HR TD PT72: 1.0000 | MEDICATED_PATCH | TRANSDERMAL | Status: DC

## 2012-08-03 NOTE — Patient Instructions (Addendum)
OK to get pain medication to last until 06/13  Take antibiotic for wound on left leg  Majke a follow-up appointment in 1 week; you may cancel this appointment if the wound is getting better  Lease make an appointment with Dr. Deirdre Priest for 1 month
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
## 2012-08-04 ENCOUNTER — Other Ambulatory Visit: Payer: Self-pay | Admitting: Family Medicine

## 2012-08-05 DIAGNOSIS — L03116 Cellulitis of left lower limb: Secondary | ICD-10-CM | POA: Insufficient documentation

## 2012-08-05 NOTE — Assessment & Plan Note (Addendum)
He threw away last Rx and needed 1 week's worth to get him to next refill. His history seems reliable; and he seems to be dependable regarding narcotics based on previous notes. Rx given for additional week's worth Fentanyl patch. Advised to schedule follow-up with PCP in 1 month, prior to running out of Rx.
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
## 2012-08-06 ENCOUNTER — Ambulatory Visit: Payer: Medicare Other | Admitting: Family Medicine

## 2012-08-06 DIAGNOSIS — B182 Chronic viral hepatitis C: Secondary | ICD-10-CM | POA: Diagnosis not present

## 2012-08-08 ENCOUNTER — Other Ambulatory Visit (HOSPITAL_COMMUNITY): Payer: Self-pay | Admitting: *Deleted

## 2012-08-09 ENCOUNTER — Encounter (HOSPITAL_COMMUNITY)
Admission: RE | Admit: 2012-08-09 | Discharge: 2012-08-09 | Disposition: A | Discharge status: Home / Self Care | Payer: Medicare Other | Source: Ambulatory Visit | Attending: Internal Medicine | Admitting: Internal Medicine

## 2012-08-09 DIAGNOSIS — M79609 Pain in unspecified limb: Secondary | ICD-10-CM | POA: Insufficient documentation

## 2012-08-09 DIAGNOSIS — I10 Essential (primary) hypertension: Secondary | ICD-10-CM | POA: Insufficient documentation

## 2012-08-09 DIAGNOSIS — F17200 Nicotine dependence, unspecified, uncomplicated: Secondary | ICD-10-CM | POA: Insufficient documentation

## 2012-08-09 LAB — PREPARE RBC (CROSSMATCH)

## 2012-08-09 LAB — ABO/RH: ABO/RH(D): O POS

## 2012-08-10 ENCOUNTER — Encounter (HOSPITAL_COMMUNITY)
Admission: RE | Admit: 2012-08-10 | Discharge: 2012-08-10 | Disposition: A | Discharge status: Home / Self Care | Payer: Medicare Other | Source: Ambulatory Visit | Attending: Internal Medicine | Admitting: Internal Medicine

## 2012-08-10 MED ORDER — DIPHENHYDRAMINE HCL 25 MG PO CAPS: 25.0000 mg | ORAL_CAPSULE | Freq: Once | ORAL | Status: DC

## 2012-08-10 MED ORDER — DIPHENHYDRAMINE HCL 25 MG PO CAPS
ORAL_CAPSULE | ORAL | Status: AC
  Administered 2012-08-10: 25 mg
  Filled 2012-08-10: qty 1

## 2012-08-10 MED ORDER — ACETAMINOPHEN 325 MG PO TABS
ORAL_TABLET | ORAL | Status: AC
  Administered 2012-08-10: 650 mg
  Filled 2012-08-10: qty 2

## 2012-08-10 MED ORDER — ACETAMINOPHEN 325 MG PO TABS: 650.0000 mg | ORAL_TABLET | Freq: Once | ORAL | Status: DC

## 2012-08-11 LAB — TYPE AND SCREEN
Antibody Screen: NEGATIVE
Unit division: 0

## 2012-08-13 ENCOUNTER — Telehealth: Payer: Self-pay | Admitting: *Deleted

## 2012-08-13 NOTE — Telephone Encounter (Signed)
Received prior authorization form from [facility] for Fentanyl  Form placed in doctor's box for completion,please place in fax box when complete. Wyatt Haste, RN-BSN

## 2012-08-14 NOTE — Telephone Encounter (Signed)
Completed and put in fax box.

## 2012-08-22 DIAGNOSIS — B182 Chronic viral hepatitis C: Secondary | ICD-10-CM | POA: Diagnosis not present

## 2012-08-29 DIAGNOSIS — B182 Chronic viral hepatitis C: Secondary | ICD-10-CM | POA: Diagnosis not present

## 2012-09-03 ENCOUNTER — Encounter: Payer: Self-pay | Admitting: Family Medicine

## 2012-09-03 ENCOUNTER — Ambulatory Visit (INDEPENDENT_AMBULATORY_CARE_PROVIDER_SITE_OTHER): Payer: Medicare Other | Admitting: Family Medicine

## 2012-09-03 VITALS — BP 115/70 | HR 76 | Temp 98.2°F | Wt 236.0 lb

## 2012-09-03 DIAGNOSIS — F329 Major depressive disorder, single episode, unspecified: Secondary | ICD-10-CM | POA: Diagnosis not present

## 2012-09-03 DIAGNOSIS — L0291 Cutaneous abscess, unspecified: Secondary | ICD-10-CM | POA: Diagnosis not present

## 2012-09-03 DIAGNOSIS — B1710 Acute hepatitis C without hepatic coma: Secondary | ICD-10-CM

## 2012-09-03 DIAGNOSIS — IMO0002 Reserved for concepts with insufficient information to code with codable children: Secondary | ICD-10-CM | POA: Diagnosis not present

## 2012-09-03 DIAGNOSIS — I10 Essential (primary) hypertension: Secondary | ICD-10-CM | POA: Diagnosis not present

## 2012-09-03 DIAGNOSIS — R599 Enlarged lymph nodes, unspecified: Secondary | ICD-10-CM | POA: Diagnosis not present

## 2012-09-03 DIAGNOSIS — L049 Acute lymphadenitis, unspecified: Secondary | ICD-10-CM | POA: Diagnosis not present

## 2012-09-03 DIAGNOSIS — M79609 Pain in unspecified limb: Secondary | ICD-10-CM | POA: Diagnosis not present

## 2012-09-03 DIAGNOSIS — F3289 Other specified depressive episodes: Secondary | ICD-10-CM

## 2012-09-03 DIAGNOSIS — M2190 Unspecified acquired deformity of unspecified limb: Secondary | ICD-10-CM | POA: Diagnosis not present

## 2012-09-03 DIAGNOSIS — F17200 Nicotine dependence, unspecified, uncomplicated: Secondary | ICD-10-CM | POA: Diagnosis not present

## 2012-09-03 DIAGNOSIS — L02419 Cutaneous abscess of limb, unspecified: Secondary | ICD-10-CM | POA: Diagnosis not present

## 2012-09-03 MED ORDER — FENTANYL 50 MCG/HR TD PT72: 1.0000 | MEDICATED_PATCH | TRANSDERMAL | Status: DC

## 2012-09-03 NOTE — Patient Instructions (Addendum)
Take the prescription to the pharmacy.  Ask them to send me a prior authorization form if needed  Stop the lisinopril 20 mg tablet by itself and monitor your blood pressure  It should stay < 140/90.  If it gets above that then call me  See me the end of this month

## 2012-09-04 NOTE — Assessment & Plan Note (Signed)
Continues to be painful.  Fentanyl patches are well tolerated and he seems to use them appropriately.  Will refill for one month to see if his insurance will pay for

## 2012-09-04 NOTE — Assessment & Plan Note (Signed)
Perhaps over treated with his recent low blood pressures.  Perhaps also due to hep c treatment.  Will lower his lisinopril dose and monitor

## 2012-09-04 NOTE — Assessment & Plan Note (Signed)
Excalated with hep c treatment and family issues.  Asked him to contact the Hep C clinic for follow up.  Will see him back in a few weeks.  Contact me if worsening

## 2012-09-04 NOTE — Progress Notes (Signed)
  Subjective:    Patient ID: Andre Sherman, male    DOB: [birth year], 57 y.o.   MRN: [id number]  HPI  Leg pain Lost fentanyl patches (see prior [place] note) and then insurance is not paying for patch qod.   Has tried to do without them but pain is very bad - keeps his from moving and makes him very irritable.  No redness or fever.  Depression Feeling very tired and stressed.  Lots of family stressors with son's mental illness.  No suicidal ideation but havnig trouble sleeping and thinkinig.  Taking elavil regularly  HYPERTENSION Disease Monitoring Home BP Monitoring Not checking but has been on low side with most of his recent appts Chest pain- no    Dyspnea- no Medications Compliance-  As ordered. Lightheadedness-  When he stands   Edema- no ROS - See HPI  PMH Lab Review   Potassium  Date Value Range Status  07/21/2011 4.9  3.5 - 5.3 mEq/L Final     Sodium  Date Value Range Status  07/21/2011 138  135 - 145 mEq/L Final     Creat  Date Value Range Status  07/21/2011 0.96  0.50 - 1.35 mg/dL Final     Creatinine, Ser  Date Value Range Status  01/06/2011 0.89  0.50 - 1.35 mg/dL Final            Hep C Apparently not tolerating the medications well.  Needed transfusion and has been taken off of one medication (he is not sure which)  Review of Symptoms - see HPI  PMH - Smoking status noted.      Review of Systems     Objective:   Physical Exam  Appears anxious Good eye contact and interactive Has left leg with compression bandage      Assessment & Plan:

## 2012-09-04 NOTE — Assessment & Plan Note (Signed)
He will contact hep c clinic.  I am concerned some of his depression and anxiety is related to his current treatment

## 2012-09-10 ENCOUNTER — Other Ambulatory Visit: Payer: Self-pay | Admitting: Family Medicine

## 2012-09-10 ENCOUNTER — Telehealth: Payer: Self-pay | Admitting: Family Medicine

## 2012-09-10 MED ORDER — FENTANYL 50 MCG/HR TD PT72: 1.0000 | MEDICATED_PATCH | TRANSDERMAL | Status: DC

## 2012-09-10 NOTE — Telephone Encounter (Signed)
Patient comes in stating that he was only written a rx for 4 patches, when he usually gets 15 in a month. Would like to get a rx for the remaining 11 patches.

## 2012-09-10 NOTE — Telephone Encounter (Signed)
Will forward to dr.

## 2012-09-13 ENCOUNTER — Telehealth: Payer: Self-pay | Admitting: Family Medicine

## 2012-09-13 NOTE — Telephone Encounter (Signed)
Pt is requesting a more pain patches. He said that Dr. Deirdre Priest is aware of this. JW

## 2012-09-13 NOTE — Telephone Encounter (Signed)
Will fwd to MD.  Andre Sherman, CMA  

## 2012-09-14 ENCOUNTER — Telehealth: Payer: Self-pay | Admitting: *Deleted

## 2012-09-14 NOTE — Telephone Encounter (Signed)
Completd and put in fax pile

## 2012-09-14 NOTE — Telephone Encounter (Addendum)
Pt comes in today extremely upset due to waiting for pain patches. Pt is wringing hands, unable to sit still, close to tears, " I can't make it without them" ,agitatted . Insurance company contacted and after approximately 45 minutes - prior approval complete on phone until 09/11/13. [facility] called and confirmed that claim did go through. Pt anxiety seems to be relieved after learning they were approved. Dr. Deirdre Priest informed of situation. Wyatt Haste, RN-BSN

## 2012-09-14 NOTE — Telephone Encounter (Signed)
Received [facility] form [facility] for   Form placed in doctor's box for completion. Please complete and fax to number at top. Wyatt Haste, RN-BSN

## 2012-09-17 NOTE — Telephone Encounter (Signed)
Fax received regarding Fentanyl patch approval stating that pt quantity prescribed is above patients' plan limits." In addition to coverage review, in the future, this medication may be subject to an additional quantity limit that is applied to certain types of pain medication" pt is approved from 08/15/2012 until 09/14/2013 - unless total quantity of pain medication exceeds quantity limit than another review maybe required. Wyatt Haste, RN-BSN

## 2012-09-22 ENCOUNTER — Other Ambulatory Visit: Payer: Self-pay | Admitting: Family Medicine

## 2012-09-24 ENCOUNTER — Encounter: Payer: Self-pay | Admitting: Family Medicine

## 2012-09-24 ENCOUNTER — Ambulatory Visit (INDEPENDENT_AMBULATORY_CARE_PROVIDER_SITE_OTHER): Payer: Medicare Other | Admitting: Family Medicine

## 2012-09-24 VITALS — BP 114/68 | HR 80 | Temp 98.6°F | Ht 70.0 in | Wt 233.0 lb

## 2012-09-24 DIAGNOSIS — I10 Essential (primary) hypertension: Secondary | ICD-10-CM | POA: Diagnosis not present

## 2012-09-24 DIAGNOSIS — M79609 Pain in unspecified limb: Secondary | ICD-10-CM | POA: Diagnosis not present

## 2012-09-24 DIAGNOSIS — B1710 Acute hepatitis C without hepatic coma: Secondary | ICD-10-CM | POA: Diagnosis not present

## 2012-09-24 DIAGNOSIS — M2190 Unspecified acquired deformity of unspecified limb: Secondary | ICD-10-CM | POA: Diagnosis not present

## 2012-09-24 DIAGNOSIS — F3289 Other specified depressive episodes: Secondary | ICD-10-CM | POA: Diagnosis not present

## 2012-09-24 DIAGNOSIS — F17200 Nicotine dependence, unspecified, uncomplicated: Secondary | ICD-10-CM | POA: Diagnosis not present

## 2012-09-24 DIAGNOSIS — L049 Acute lymphadenitis, unspecified: Secondary | ICD-10-CM | POA: Diagnosis not present

## 2012-09-24 DIAGNOSIS — R599 Enlarged lymph nodes, unspecified: Secondary | ICD-10-CM | POA: Diagnosis not present

## 2012-09-24 DIAGNOSIS — L0291 Cutaneous abscess, unspecified: Secondary | ICD-10-CM | POA: Diagnosis not present

## 2012-09-24 DIAGNOSIS — IMO0002 Reserved for concepts with insufficient information to code with codable children: Secondary | ICD-10-CM | POA: Diagnosis not present

## 2012-09-24 DIAGNOSIS — F329 Major depressive disorder, single episode, unspecified: Secondary | ICD-10-CM | POA: Diagnosis not present

## 2012-09-24 DIAGNOSIS — L02419 Cutaneous abscess of limb, unspecified: Secondary | ICD-10-CM | POA: Diagnosis not present

## 2012-09-24 MED ORDER — FENTANYL 50 MCG/HR TD PT72: 1.0000 | MEDICATED_PATCH | TRANSDERMAL | Status: DC

## 2012-09-24 NOTE — Patient Instructions (Addendum)
Take only one metoprolol twice daily and monitor your blood pressure.  If regularly > 140/90 the go back to 2 pils twice daily   Come back in if you are feeling lightheadness or bad again  Follow up with the Hep C clinic for blood work and medication dosing  Come back in 3 months for a check up  Dr Randolm Idol is my new partner your son could see him or I would be happy to see him in 2 months
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
## 2012-09-24 NOTE — Assessment & Plan Note (Signed)
Controlled. Continue to decrease medications since blood pressure is low.

## 2012-09-24 NOTE — Assessment & Plan Note (Signed)
Stable Continue fentanyl qod - gave 3 month supply

## 2012-09-25 ENCOUNTER — Other Ambulatory Visit: Payer: Self-pay | Admitting: Family Medicine

## 2012-09-29 IMAGING — CT CT HEAD W/O CM
1 of 2 series · 13 of 30 positions shown, 17 images · non-contrast
Comparison: None.

CLINICAL DATA: Left-sided weakness since yesterday with slight
headache. Hepatitis C.  Tobacco use disorder.  Hypertension.

CT HEAD WITHOUT CONTRAST
TECHNIQUE: Contiguous axial images were obtained from the base of
the skull through the vertex without contrast.

[Series 2: brain · axial · 0.49mm/px · z∈[+166,+304]mm · 13 of 32 slices shown, 17 images]
[im 3/32  brain]
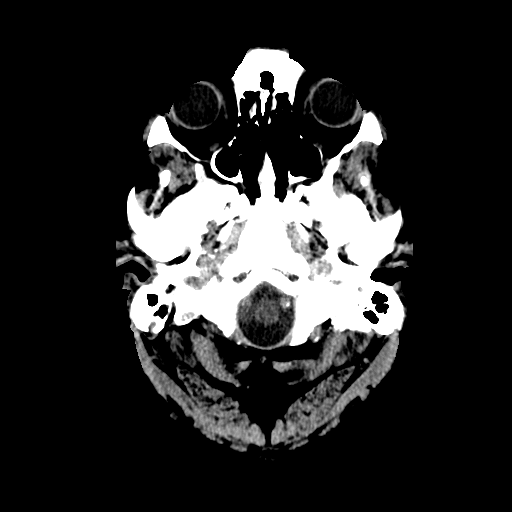
[im 3/32  bone]
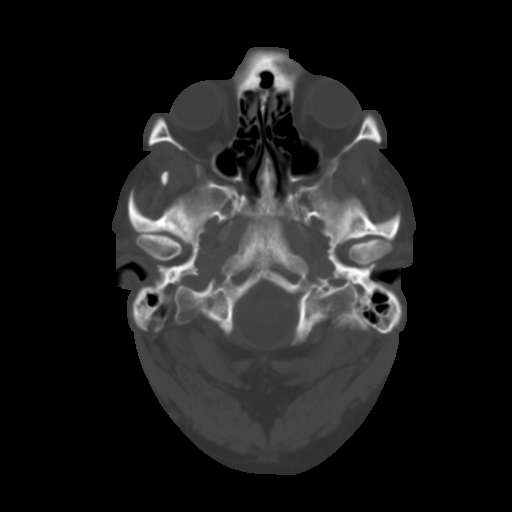
[im 5/32  brain]
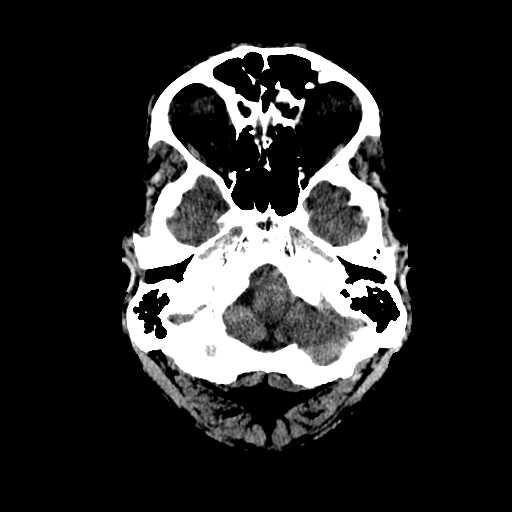
[im 7/32  brain]
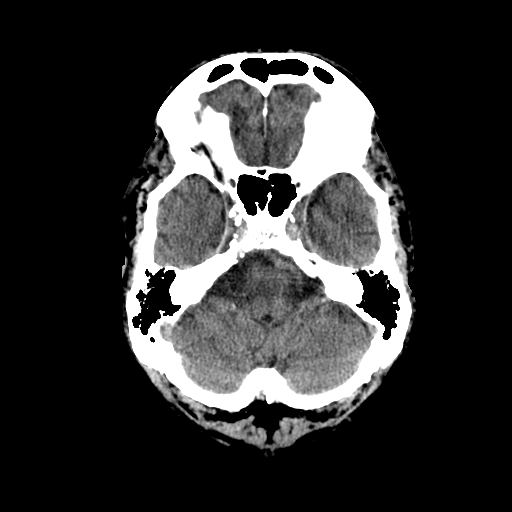
[im 9/32  brain]
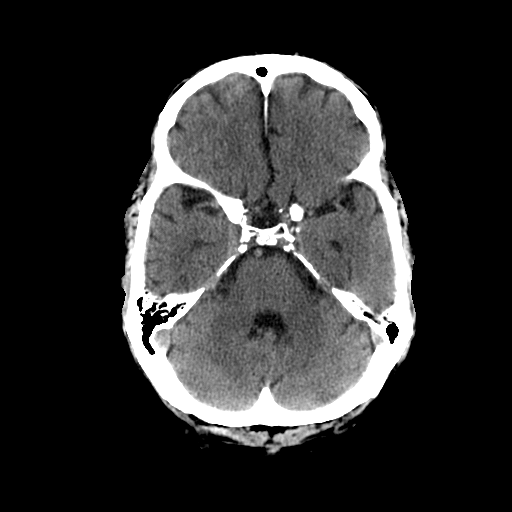
[im 12/32  brain]
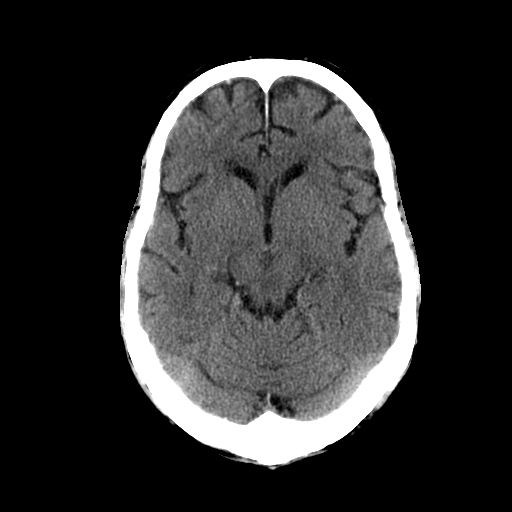
[im 12/32  bone]
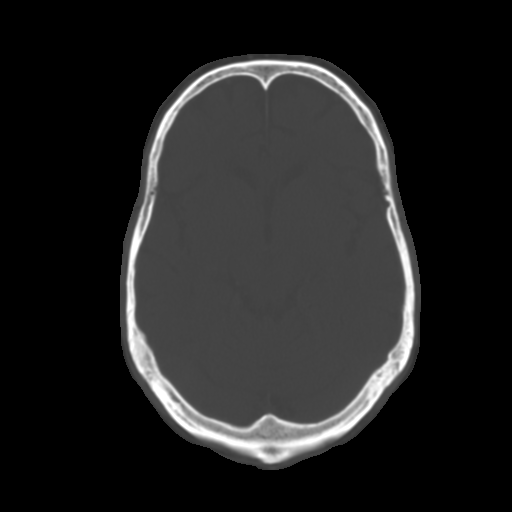
[im 14/32  brain]
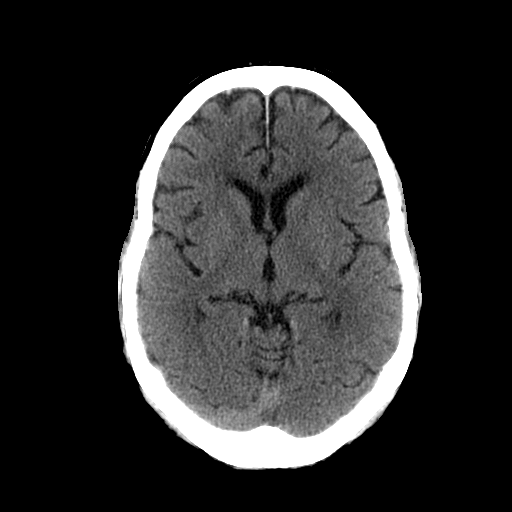
[im 16/32  brain]
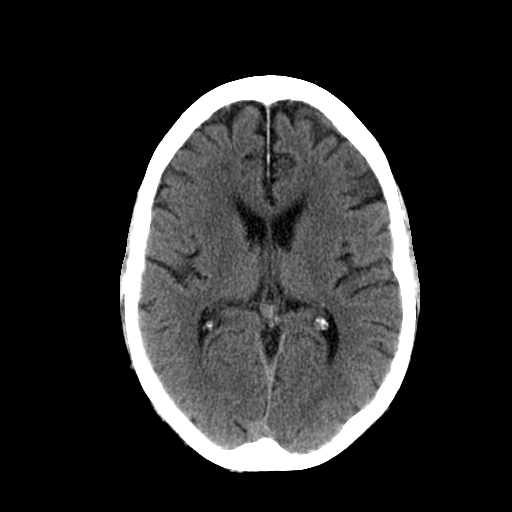
[im 18/32  brain]
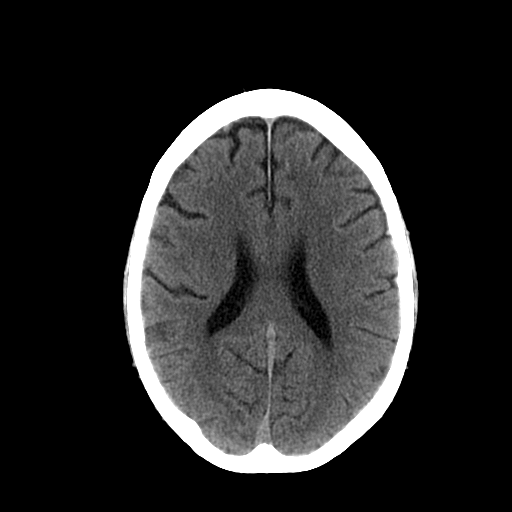
[im 20/32  brain]
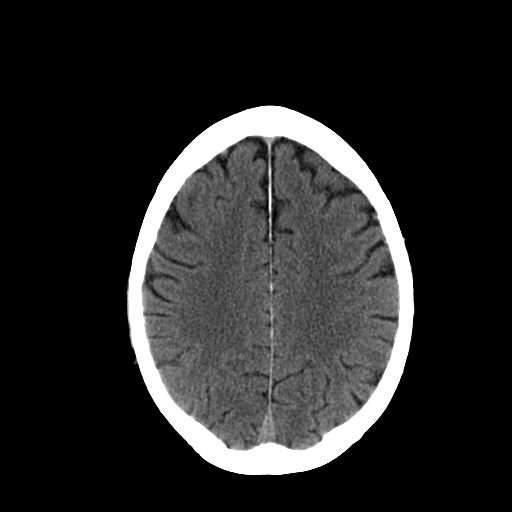
[im 20/32  bone]
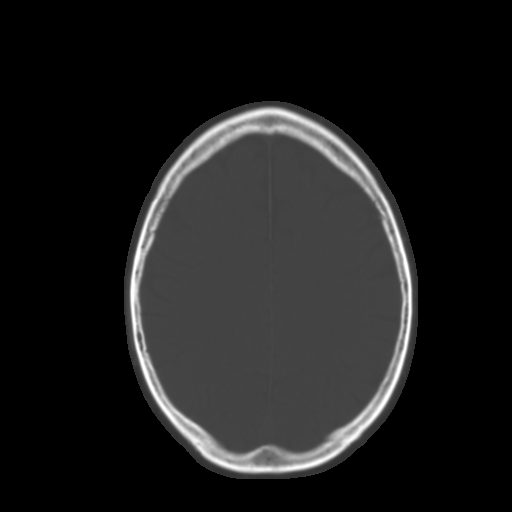
[im 23/32  brain]
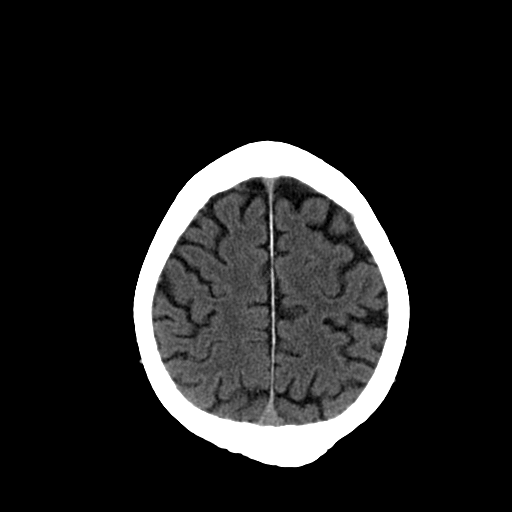
[im 25/32  brain]
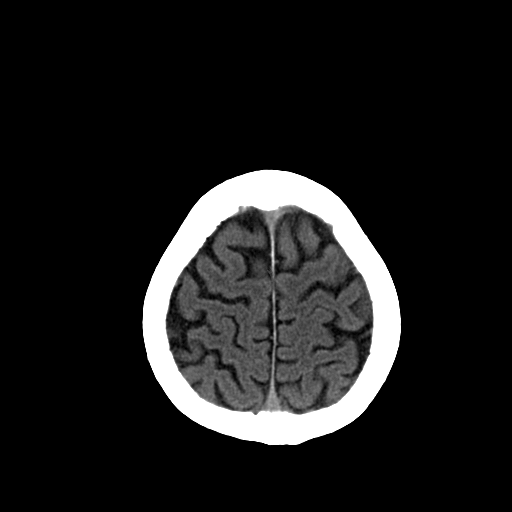
[im 27/32  brain]
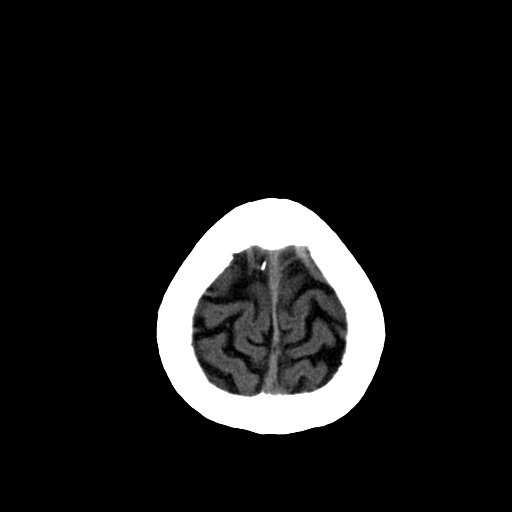
[im 29/32  brain]
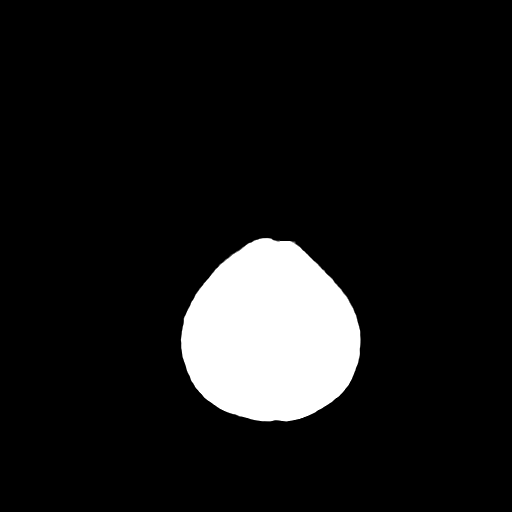
[im 29/32  bone]
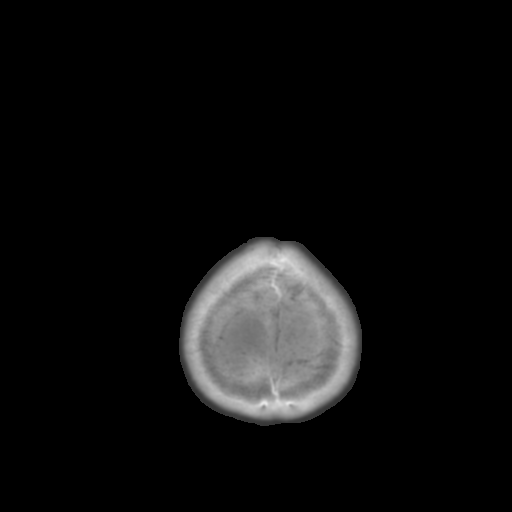

[13 of 30 positions shown; findings below may reference images not displayed]

FINDINGS: There is no evidence for acute infarction, intracranial
hemorrhage, mass lesion, hydrocephalus, or extra-axial fluid.
Premature atrophy is present for age 55.  Chronic microvascular
ischemic change is noted in the periventricular and subcortical
white matter. Clear sinuses and mastoids.  Calvarium intact.
Premature vascular calcification.
IMPRESSION: Premature atrophy and small vessel disease.  No acute intracranial
findings.  No visible acute stroke or bleed.

## 2012-10-03 DIAGNOSIS — B182 Chronic viral hepatitis C: Secondary | ICD-10-CM | POA: Diagnosis not present

## 2012-11-08 DIAGNOSIS — B182 Chronic viral hepatitis C: Secondary | ICD-10-CM | POA: Diagnosis not present

## 2012-12-05 ENCOUNTER — Ambulatory Visit (INDEPENDENT_AMBULATORY_CARE_PROVIDER_SITE_OTHER): Payer: Medicare Other | Admitting: Family Medicine

## 2012-12-05 ENCOUNTER — Encounter: Payer: Self-pay | Admitting: Family Medicine

## 2012-12-05 VITALS — BP 132/90 | HR 76 | Temp 98.7°F | Ht 70.0 in | Wt 234.0 lb

## 2012-12-05 DIAGNOSIS — B1710 Acute hepatitis C without hepatic coma: Secondary | ICD-10-CM | POA: Diagnosis not present

## 2012-12-05 DIAGNOSIS — R599 Enlarged lymph nodes, unspecified: Secondary | ICD-10-CM | POA: Diagnosis not present

## 2012-12-05 DIAGNOSIS — B182 Chronic viral hepatitis C: Secondary | ICD-10-CM | POA: Diagnosis not present

## 2012-12-05 DIAGNOSIS — F3289 Other specified depressive episodes: Secondary | ICD-10-CM | POA: Diagnosis not present

## 2012-12-05 DIAGNOSIS — I10 Essential (primary) hypertension: Secondary | ICD-10-CM | POA: Diagnosis not present

## 2012-12-05 DIAGNOSIS — L02419 Cutaneous abscess of limb, unspecified: Secondary | ICD-10-CM | POA: Diagnosis not present

## 2012-12-05 DIAGNOSIS — IMO0002 Reserved for concepts with insufficient information to code with codable children: Secondary | ICD-10-CM | POA: Diagnosis not present

## 2012-12-05 DIAGNOSIS — L03115 Cellulitis of right lower limb: Secondary | ICD-10-CM | POA: Insufficient documentation

## 2012-12-05 DIAGNOSIS — Z23 Encounter for immunization: Secondary | ICD-10-CM

## 2012-12-05 DIAGNOSIS — F329 Major depressive disorder, single episode, unspecified: Secondary | ICD-10-CM | POA: Diagnosis not present

## 2012-12-05 DIAGNOSIS — M79609 Pain in unspecified limb: Secondary | ICD-10-CM | POA: Diagnosis not present

## 2012-12-05 DIAGNOSIS — L0291 Cutaneous abscess, unspecified: Secondary | ICD-10-CM | POA: Diagnosis not present

## 2012-12-05 DIAGNOSIS — L049 Acute lymphadenitis, unspecified: Secondary | ICD-10-CM | POA: Diagnosis not present

## 2012-12-05 MED ORDER — SULFAMETHOXAZOLE-TRIMETHOPRIM 800-160 MG PO TABS: 1.0000 | ORAL_TABLET | Freq: Two times a day (BID) | ORAL | Status: DC

## 2012-12-05 MED ORDER — MUPIROCIN CALCIUM 2 % EX CREA: TOPICAL_CREAM | Freq: Three times a day (TID) | CUTANEOUS | Status: DC

## 2012-12-05 NOTE — Assessment & Plan Note (Signed)
Cellulitis after abrasion to leg. Will treat with Bactrim DS x10 days plus bactroban to the abrasion (if unable to afford this, he can use OTC polysporin.) Elevate leg for edema. F/u if he fails to improve, has a fever or feels ill. Pt agrees with plan.
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
## 2012-12-05 NOTE — Patient Instructions (Signed)
Take the antibiotic twice daily for 10 days.  Use the Bactroban on the open areas 2-3 times per day. Keep your leg elevated.  If you get worse, have a fever, or just dont feel well, come back to see [place].  Amber M. Hairford, M.D.

## 2012-12-20 DIAGNOSIS — B182 Chronic viral hepatitis C: Secondary | ICD-10-CM | POA: Diagnosis not present

## 2012-12-26 ENCOUNTER — Ambulatory Visit (INDEPENDENT_AMBULATORY_CARE_PROVIDER_SITE_OTHER): Payer: Medicare Other | Admitting: Family Medicine

## 2012-12-26 ENCOUNTER — Encounter: Payer: Self-pay | Admitting: Family Medicine

## 2012-12-26 VITALS — BP 100/62 | HR 64 | Temp 98.5°F | Ht 70.0 in | Wt 235.0 lb

## 2012-12-26 DIAGNOSIS — L049 Acute lymphadenitis, unspecified: Secondary | ICD-10-CM | POA: Diagnosis not present

## 2012-12-26 DIAGNOSIS — I10 Essential (primary) hypertension: Secondary | ICD-10-CM | POA: Diagnosis not present

## 2012-12-26 DIAGNOSIS — IMO0002 Reserved for concepts with insufficient information to code with codable children: Secondary | ICD-10-CM | POA: Diagnosis not present

## 2012-12-26 DIAGNOSIS — B182 Chronic viral hepatitis C: Secondary | ICD-10-CM | POA: Diagnosis not present

## 2012-12-26 DIAGNOSIS — B1710 Acute hepatitis C without hepatic coma: Secondary | ICD-10-CM | POA: Diagnosis not present

## 2012-12-26 DIAGNOSIS — M2190 Unspecified acquired deformity of unspecified limb: Secondary | ICD-10-CM | POA: Diagnosis not present

## 2012-12-26 DIAGNOSIS — L02419 Cutaneous abscess of limb, unspecified: Secondary | ICD-10-CM | POA: Diagnosis not present

## 2012-12-26 DIAGNOSIS — F329 Major depressive disorder, single episode, unspecified: Secondary | ICD-10-CM

## 2012-12-26 DIAGNOSIS — R599 Enlarged lymph nodes, unspecified: Secondary | ICD-10-CM | POA: Diagnosis not present

## 2012-12-26 DIAGNOSIS — M79609 Pain in unspecified limb: Secondary | ICD-10-CM | POA: Diagnosis not present

## 2012-12-26 DIAGNOSIS — L0291 Cutaneous abscess, unspecified: Secondary | ICD-10-CM | POA: Diagnosis not present

## 2012-12-26 DIAGNOSIS — F3289 Other specified depressive episodes: Secondary | ICD-10-CM | POA: Diagnosis not present

## 2012-12-26 MED ORDER — FENTANYL 50 MCG/HR TD PT72: 1.0000 | MEDICATED_PATCH | TRANSDERMAL | Status: DC

## 2012-12-26 NOTE — Assessment & Plan Note (Addendum)
Not well controlled but may be due to Hep C treatment.  Will see how he does off the medication.  Continue elavil

## 2012-12-26 NOTE — Assessment & Plan Note (Signed)
Stable with the chronic pain.  Continue fentanyl and gabapentin

## 2012-12-26 NOTE — Assessment & Plan Note (Signed)
Over treated.  Will continue to lower medications and monitor

## 2012-12-26 NOTE — Progress Notes (Signed)
  Subjective:    Patient ID: Andre Sherman, male    DOB: 20-Aug-1955, 57 y.o.   MRN: [id number]  HPI  Leg Pain Under control with fentanyl gabapentin and Elavil.   When he is without causes him signif pain.  When controlled he is able to ambulate well  Hep C Finished medications 1 week ago.  To have follow up blood work this week  HYPERTENSION Disease Monitoring Home BP Monitoring has been controlled at other office visits - sometimes low Chest pain- no    Dyspnea- no Medications Compliance-  As prescribed. Lightheadedness-  Sometimes when stands.  Is better since stopped Hep C treatment   Edema- trace ROS - See HPI  Depression Has been down a lot lately but seems to be better since stopped Hep C medication.  Does not feel he has much energy.   No suicidal ideation   PMH Lab Review   Potassium  Date Value Range Status  07/21/2011 4.9  3.5 - 5.3 mEq/L Final     Sodium  Date Value Range Status  07/21/2011 138  135 - 145 mEq/L Final     Creat  Date Value Range Status  07/21/2011 0.96  0.50 - 1.35 mg/dL Final     Creatinine, Ser  Date Value Range Status  01/06/2011 0.89  0.50 - 1.35 mg/dL Final      Review of Symptoms - see HPI  PMH - Smoking status noted.        Review of Systems     Objective:   Physical Exam  Heart - Regular rate and rhythm.  No murmurs, gallops or rubs.    Lungs:  Normal respiratory effort, chest expands symmetrically. Lungs are clear to auscultation, no crackles or wheezes. Extrem - has compression stockings on bilaterally      Assessment & Plan:

## 2012-12-26 NOTE — Patient Instructions (Addendum)
Good to see you today!  Thanks for coming in.  Take 1/2 of lisinopril/hxtz tab a day  Ask your Hep C doctor to give you a copy of your blood tests and to send me a copy  Check your blood pressure every few days - call in and give [place] the reading next week  Once I hear your blood pressure reading and see your blood work I will be in touch

## 2013-02-04 DIAGNOSIS — B182 Chronic viral hepatitis C: Secondary | ICD-10-CM | POA: Diagnosis not present

## 2013-02-26 ENCOUNTER — Other Ambulatory Visit: Payer: Self-pay | Admitting: Internal Medicine

## 2013-02-26 DIAGNOSIS — C220 Liver cell carcinoma: Secondary | ICD-10-CM

## 2013-03-04 ENCOUNTER — Other Ambulatory Visit: Payer: Self-pay | Admitting: Family Medicine

## 2013-03-04 DIAGNOSIS — I10 Essential (primary) hypertension: Secondary | ICD-10-CM

## 2013-03-04 MED ORDER — METOPROLOL TARTRATE 25 MG PO TABS: 25.0000 mg | ORAL_TABLET | Freq: Two times a day (BID) | ORAL | Status: DC

## 2013-03-21 ENCOUNTER — Other Ambulatory Visit: Payer: Self-pay | Admitting: Internal Medicine

## 2013-03-21 ENCOUNTER — Ambulatory Visit
Admission: RE | Admit: 2013-03-21 | Discharge: 2013-03-21 | Disposition: A | Discharge status: Home / Self Care | Payer: Medicare Other | Source: Ambulatory Visit | Attending: Internal Medicine | Admitting: Internal Medicine

## 2013-03-21 DIAGNOSIS — K828 Other specified diseases of gallbladder: Secondary | ICD-10-CM | POA: Diagnosis not present

## 2013-03-21 DIAGNOSIS — C220 Liver cell carcinoma: Secondary | ICD-10-CM

## 2013-04-18 ENCOUNTER — Other Ambulatory Visit: Payer: Self-pay | Admitting: *Deleted

## 2013-04-18 MED ORDER — METOPROLOL TARTRATE 25 MG PO TABS: 25.0000 mg | ORAL_TABLET | Freq: Two times a day (BID) | ORAL | Status: DC

## 2013-04-18 NOTE — Telephone Encounter (Signed)
Request to dispense 90 day supply. Chaske Paskett L, RN  

## 2013-04-24 ENCOUNTER — Encounter: Payer: Self-pay | Admitting: Family Medicine

## 2013-04-24 ENCOUNTER — Ambulatory Visit (INDEPENDENT_AMBULATORY_CARE_PROVIDER_SITE_OTHER): Payer: Medicare Other | Admitting: Family Medicine

## 2013-04-24 VITALS — BP 136/87 | HR 75 | Ht 70.0 in | Wt 238.0 lb

## 2013-04-24 DIAGNOSIS — M79609 Pain in unspecified limb: Secondary | ICD-10-CM | POA: Diagnosis not present

## 2013-04-24 DIAGNOSIS — F17200 Nicotine dependence, unspecified, uncomplicated: Secondary | ICD-10-CM

## 2013-04-24 DIAGNOSIS — L02419 Cutaneous abscess of limb, unspecified: Secondary | ICD-10-CM | POA: Diagnosis not present

## 2013-04-24 DIAGNOSIS — IMO0002 Reserved for concepts with insufficient information to code with codable children: Secondary | ICD-10-CM | POA: Diagnosis not present

## 2013-04-24 DIAGNOSIS — L0291 Cutaneous abscess, unspecified: Secondary | ICD-10-CM | POA: Diagnosis not present

## 2013-04-24 DIAGNOSIS — M2190 Unspecified acquired deformity of unspecified limb: Secondary | ICD-10-CM | POA: Diagnosis not present

## 2013-04-24 DIAGNOSIS — L0390 Cellulitis, unspecified: Secondary | ICD-10-CM | POA: Diagnosis not present

## 2013-04-24 DIAGNOSIS — I10 Essential (primary) hypertension: Secondary | ICD-10-CM | POA: Diagnosis not present

## 2013-04-24 DIAGNOSIS — L03119 Cellulitis of unspecified part of limb: Secondary | ICD-10-CM | POA: Diagnosis not present

## 2013-04-24 DIAGNOSIS — F329 Major depressive disorder, single episode, unspecified: Secondary | ICD-10-CM | POA: Diagnosis not present

## 2013-04-24 DIAGNOSIS — B182 Chronic viral hepatitis C: Secondary | ICD-10-CM | POA: Diagnosis not present

## 2013-04-24 DIAGNOSIS — F3289 Other specified depressive episodes: Secondary | ICD-10-CM | POA: Diagnosis not present

## 2013-04-24 DIAGNOSIS — B1710 Acute hepatitis C without hepatic coma: Secondary | ICD-10-CM | POA: Diagnosis not present

## 2013-04-24 DIAGNOSIS — L049 Acute lymphadenitis, unspecified: Secondary | ICD-10-CM | POA: Diagnosis not present

## 2013-04-24 DIAGNOSIS — R599 Enlarged lymph nodes, unspecified: Secondary | ICD-10-CM | POA: Diagnosis not present

## 2013-04-24 MED ORDER — FENTANYL 50 MCG/HR TD PT72: 50.0000 ug | MEDICATED_PATCH | TRANSDERMAL | Status: DC

## 2013-04-24 NOTE — Progress Notes (Signed)
   Subjective:    Patient ID: Andre Sherman, male    DOB: Jul 07, 1955, 58 y.o.   MRN: [id number]  HPI Leg Pain Under control with fentanyl, gabapentin and Elavil.  Taking as perscribed.  When he is without fentanyl it causes him signif pain.  When controlled he is able to ambulate well.    Hep C Finished medications several months ago.  Had a follow up US that seems to be unremarkalbe.  Has exam today with his Hepatologist  HYPERTENSION Disease Monitoring Home BP Monitoring has been controlled at other office visits - sometimes low Chest pain- no    Dyspnea- no Medications Compliance-  He is taking a whole tablet of HCTZ/Lisn or just Lisno alone he is not sure and did not bring medications . Lightheadedness-  No   Edema- trace as usual with his leg deformity  Tobacco Continues to smoke.  May want to work on after his Hep C situation is straigh  ROS - See HPI    Review of Systems     Objective:   Physical Exam Alert no acute distress Heart - Regular rate and rhythm.  No murmurs, gallops or rubs.    Lungs:  Normal respiratory effort, chest expands symmetrically. Lungs are clear to auscultation, no crackles or wheezes. Extrem - wearing left compressino stocking.  Trace edema below        Assessment & Plan:

## 2013-04-24 NOTE — Assessment & Plan Note (Signed)
Stable on current medication regimen.  He seems to take appropriately and it enables him to be active and ambulatory

## 2013-04-24 NOTE — Assessment & Plan Note (Signed)
Not really contemplative.  Will continue to bring up.  Probably not a Chantix candidate given his depression

## 2013-04-24 NOTE — Patient Instructions (Signed)
Good to see you today!  Thanks for coming in.  Let me know how things go with your liver doctor  I would like them to check your CBC and CMET with other blood they draw  Your blood pressure is doing well - Please call us and give the names and doses of what you are taking - I can send in 90 day supply  Once the hepatitis is straight we will work on stopping smoking!  Come back in 4 months or sooner if needed  Have a great Spring

## 2013-04-24 NOTE — Assessment & Plan Note (Signed)
Stable.  Need to clarify his exact regimen.  Asked his Hep C doctor to check Cmet along with their labs

## 2013-04-26 ENCOUNTER — Other Ambulatory Visit: Payer: Self-pay | Admitting: Family Medicine

## 2013-04-26 DIAGNOSIS — I10 Essential (primary) hypertension: Secondary | ICD-10-CM

## 2013-04-26 MED ORDER — GABAPENTIN 800 MG PO TABS: ORAL_TABLET | ORAL | Status: DC

## 2013-04-26 MED ORDER — LISINOPRIL-HYDROCHLOROTHIAZIDE 20-25 MG PO TABS: 1.0000 | ORAL_TABLET | Freq: Every day | ORAL | Status: DC

## 2013-04-26 MED ORDER — AMITRIPTYLINE HCL 75 MG PO TABS: 150.0000 mg | ORAL_TABLET | Freq: Every day | ORAL | Status: DC

## 2013-04-29 ENCOUNTER — Other Ambulatory Visit: Payer: Medicare Other

## 2013-04-29 DIAGNOSIS — I10 Essential (primary) hypertension: Secondary | ICD-10-CM

## 2013-04-29 NOTE — Progress Notes (Signed)
CMP AND CBC DONE TODAY Andre Sherman

## 2013-04-30 ENCOUNTER — Encounter: Payer: Self-pay | Admitting: Family Medicine

## 2013-04-30 LAB — COMPREHENSIVE METABOLIC PANEL
ALK PHOS: 48 U/L (ref 39–117)
ALT: 20 U/L (ref 0–53)
AST: 20 U/L (ref 0–37)
Albumin: 4.3 g/dL (ref 3.5–5.2)
BUN: 16 mg/dL (ref 6–23)
CO2: 30 mEq/L (ref 19–32)
Calcium: 9.5 mg/dL (ref 8.4–10.5)
Chloride: 102 mEq/L (ref 96–112)
Creat: 0.91 mg/dL (ref 0.50–1.35)
Glucose, Bld: 129 mg/dL — ABNORMAL HIGH (ref 70–99)
POTASSIUM: 5.2 meq/L (ref 3.5–5.3)
Sodium: 140 mEq/L (ref 135–145)
Total Bilirubin: 0.5 mg/dL (ref 0.2–1.2)
Total Protein: 7.5 g/dL (ref 6.0–8.3)

## 2013-04-30 LAB — CBC
HEMATOCRIT: 49 % (ref 39.0–52.0)
Hemoglobin: 17 g/dL (ref 13.0–17.0)
MCH: 31.1 pg (ref 26.0–34.0)
MCHC: 34.7 g/dL (ref 30.0–36.0)
MCV: 89.7 fL (ref 78.0–100.0)
Platelets: 188 10*3/uL (ref 150–400)
RBC: 5.46 MIL/uL (ref 4.22–5.81)
RDW: 13.7 % (ref 11.5–15.5)
WBC: 11.7 10*3/uL — ABNORMAL HIGH (ref 4.0–10.5)

## 2013-07-02 ENCOUNTER — Encounter: Payer: Self-pay | Admitting: *Deleted

## 2013-07-02 NOTE — Progress Notes (Signed)
Pt in clinic this AM because [facility] for F[facility]anyl patch but a prior authorization need to be completed.  Reviewing pt's chart, PA for Fentanyl was completed 07/2012 and good though 09/14/2013.  [facility] was called to inquire about the prior authorization.  Per representative pt should be able to pick up Rx without any problems; a test claim was completed and approved.  A new PA need to be completed on Jul 15, 2013 or later before the old one is expired.  Form placed in provider box for completion.  Pt also stated that he is taking Lopressor 25 mg 2 tabs twice a day instead of 1 tab twice daily because his blood pressure is high.  Pt wanted provider to change Rx for Lopressor to 2 tabs twice daily.  Pt denies any other problems/ symptoms at this time.  Will forward to PCP.  Derl Barrow, RN

## 2013-07-12 NOTE — Progress Notes (Signed)
Completed and returned to [place]ika.  

## 2013-07-15 NOTE — Progress Notes (Signed)
Received response back from [facility] prior authorization department stating a prior authorization is not required.  Plan's limit for Fentanyl patch is not being exceeded at the current prescribed dose.  [employer] case ID: [id number].  [facility]   Derl Barrow, RN

## 2013-07-15 NOTE — Progress Notes (Signed)
Prior authorization for Fentanyl patch faxed to [facility] for review.  Derl Barrow, RN

## 2013-07-26 ENCOUNTER — Telehealth: Payer: Self-pay | Admitting: Family Medicine

## 2013-07-26 NOTE — Telephone Encounter (Signed)
Pt states that his BP medication metoprolol was reduced from 2 tabs BID to 1 tab BID.  His BP has been elevated over the last few weeks.  He made an appt to come in on Monday and follow up on BP. Jazmin Hartsell,CMA

## 2013-07-26 NOTE — Telephone Encounter (Signed)
Need to speak with nurse regarding medication issues.

## 2013-07-29 ENCOUNTER — Ambulatory Visit (INDEPENDENT_AMBULATORY_CARE_PROVIDER_SITE_OTHER): Payer: Medicare Other | Admitting: Family Medicine

## 2013-07-29 ENCOUNTER — Encounter: Payer: Self-pay | Admitting: Family Medicine

## 2013-07-29 VITALS — BP 141/90 | HR 76 | Temp 98.4°F | Ht 70.0 in | Wt 233.0 lb

## 2013-07-29 DIAGNOSIS — B1710 Acute hepatitis C without hepatic coma: Secondary | ICD-10-CM

## 2013-07-29 DIAGNOSIS — R599 Enlarged lymph nodes, unspecified: Secondary | ICD-10-CM | POA: Diagnosis not present

## 2013-07-29 DIAGNOSIS — F329 Major depressive disorder, single episode, unspecified: Secondary | ICD-10-CM | POA: Diagnosis not present

## 2013-07-29 DIAGNOSIS — M79609 Pain in unspecified limb: Secondary | ICD-10-CM | POA: Diagnosis not present

## 2013-07-29 DIAGNOSIS — L0291 Cutaneous abscess, unspecified: Secondary | ICD-10-CM | POA: Diagnosis not present

## 2013-07-29 DIAGNOSIS — L0390 Cellulitis, unspecified: Secondary | ICD-10-CM | POA: Diagnosis not present

## 2013-07-29 DIAGNOSIS — F17200 Nicotine dependence, unspecified, uncomplicated: Secondary | ICD-10-CM

## 2013-07-29 DIAGNOSIS — F3289 Other specified depressive episodes: Secondary | ICD-10-CM | POA: Diagnosis not present

## 2013-07-29 DIAGNOSIS — IMO0002 Reserved for concepts with insufficient information to code with codable children: Secondary | ICD-10-CM | POA: Diagnosis not present

## 2013-07-29 DIAGNOSIS — I10 Essential (primary) hypertension: Secondary | ICD-10-CM | POA: Diagnosis not present

## 2013-07-29 DIAGNOSIS — L02419 Cutaneous abscess of limb, unspecified: Secondary | ICD-10-CM | POA: Diagnosis not present

## 2013-07-29 DIAGNOSIS — M2190 Unspecified acquired deformity of unspecified limb: Secondary | ICD-10-CM | POA: Diagnosis not present

## 2013-07-29 DIAGNOSIS — L03119 Cellulitis of unspecified part of limb: Secondary | ICD-10-CM | POA: Diagnosis not present

## 2013-07-29 DIAGNOSIS — L049 Acute lymphadenitis, unspecified: Secondary | ICD-10-CM | POA: Diagnosis not present

## 2013-07-29 MED ORDER — FENTANYL 50 MCG/HR TD PT72: 50.0000 ug | MEDICATED_PATCH | TRANSDERMAL | Status: DC

## 2013-07-29 MED ORDER — METOPROLOL TARTRATE 25 MG PO TABS: 50.0000 mg | ORAL_TABLET | Freq: Two times a day (BID) | ORAL | Status: DC

## 2013-07-29 NOTE — Patient Instructions (Signed)
Good to see you today!  Thanks for coming in.  Smoking is your major health issue Call Vinnie Level for an appointment  Come back in 4 months

## 2013-07-29 NOTE — Assessment & Plan Note (Signed)
Better controlled with current regimen.  Likely needed increased dose due to finishing his Hep C treatment

## 2013-07-29 NOTE — Progress Notes (Signed)
   Subjective:    Patient ID: Andre Sherman, male    DOB: 18-Dec-1955, 58 y.o.   MRN: [id number]  HPI  HYPERTENSION Disease Monitoring Home BP Monitoring was getting high at home and at doctor visits so went back to 2 metoprolol twice daily  Chest pain- no    Dyspnea- no Medications Compliance-  Taking regularly. Lightheadedness-  no  Edema- no  Tobacco Wishes to stop but now smoking 1 ppd.  Does want to follow up with Vinnie Level our health coach  Chronic LE Pain Stable on current dose of patch. He takes with gabapentin and amitriptylene.  Able to move around and do ADLs.  ROS - See HPI  PMH Lab Review   Potassium  Date Value Ref Range Status  04/29/2013 5.2  3.5 - 5.3 mEq/L Final     Sodium  Date Value Ref Range Status  04/29/2013 140  135 - 145 mEq/L Final     Creat  Date Value Ref Range Status  04/29/2013 0.91  0.50 - 1.35 mg/dL Final     Creatinine, Ser  Date Value Ref Range Status  01/06/2011 0.89  0.50 - 1.35 mg/dL Final        Review of Symptoms - see HPI  PMH - Smoking status noted.      Review of Systems     Objective:   Physical Exam Alert no acute distress Heart - Regular rate and rhythm.  No murmurs, gallops or rubs.    Lungs:  Normal respiratory effort, chest expands symmetrically. Lungs are clear to auscultation, no crackles or wheezes.        Assessment & Plan:

## 2013-07-29 NOTE — Assessment & Plan Note (Signed)
Stable on current dose of fentanyl patch.  Using responsibly

## 2013-07-29 NOTE — Assessment & Plan Note (Signed)
His major health issue.  Discussed approaches and benefits

## 2013-10-18 ENCOUNTER — Encounter: Payer: Self-pay | Admitting: *Deleted

## 2013-10-18 DIAGNOSIS — M2190 Unspecified acquired deformity of unspecified limb: Secondary | ICD-10-CM

## 2013-10-18 NOTE — Progress Notes (Signed)
Pt in nurse's office stating he went to the pharmacy to get Fetanyl patches refilled.  Pharmacy told pt that a prior authorization was needed.  [facility] called spoke with Crystal, representative and prior authorization renewed starting 09/18/2013 thu 10/18/2014.  Approval number: [id number].  Derl Barrow, RN

## 2013-10-24 ENCOUNTER — Other Ambulatory Visit: Payer: Self-pay | Admitting: Family Medicine

## 2013-10-24 DIAGNOSIS — M2190 Unspecified acquired deformity of unspecified limb: Secondary | ICD-10-CM

## 2013-10-24 MED ORDER — FENTANYL 50 MCG/HR TD PT72: 50.0000 ug | MEDICATED_PATCH | TRANSDERMAL | Status: DC

## 2013-10-24 NOTE — Progress Notes (Addendum)
Patient came in office today to discuss his Fetanyl patches.  [facility] and was given [facility]5 patches (last received #10 patches on 10/03/13).  Called and spoke with Benjamine Mola at [employer] [phone] ID [id number]/[id number] # [id number].  Patient will be able to get Fentanyl patches #2 on 11/02/13, then should be able to resume his monthly supply for #15 starting on 11/06/13.  Called and informed Questa at [facility].  Patient will need 2 separate Rxs for Fentanyl patches #2 and #15.  Will route note to Dr. Andria Frames to write Rxs and call patient when ready to pick up.  Burna Forts, BSN, RN-BC

## 2013-10-24 NOTE — Assessment & Plan Note (Signed)
Refills as outlined sound reasonable and Rxes provided

## 2013-10-24 NOTE — Telephone Encounter (Signed)
For some unknown computer/printer issue, the prescriptions did not print on first attempt today.  See earlier note

## 2013-10-24 NOTE — Progress Notes (Signed)
Called patient at [phone] and left voicemail that rx's were ready to be picked up.

## 2013-10-24 NOTE — Addendum Note (Signed)
Addended by: Zenia Resides on: 10/24/2013 12:03 PM   Modules accepted: Orders

## 2013-11-08 ENCOUNTER — Telehealth: Payer: Self-pay | Admitting: *Deleted

## 2013-11-08 NOTE — Telephone Encounter (Signed)
Pt called stating he could not fill his Rx for fentanyl patch and he was out.  [facility] stating insurance will only cover 200 mg equivalent of morphine.  [facility] called regarding this matter; spoke with Judson Roch.  Judson Roch completed another authorization for fentanyl patch 50 mcg/hr #15/ 30 day supply; approved 10/08/2013 to 11/07/2014.  Judson Roch completed a test claim and stated it was approved and patient should not have a problem in filling his Rx for #15 fentanyl patches.  Pt and pharmacy informed of PA approval.  PA authorization number [id number].  Derl Barrow, RN

## 2013-12-02 ENCOUNTER — Encounter: Payer: Self-pay | Admitting: Internal Medicine

## 2013-12-09 ENCOUNTER — Encounter: Payer: Self-pay | Admitting: Family Medicine

## 2013-12-09 ENCOUNTER — Ambulatory Visit (INDEPENDENT_AMBULATORY_CARE_PROVIDER_SITE_OTHER): Payer: Medicare Other | Admitting: Family Medicine

## 2013-12-09 VITALS — BP 150/91 | HR 71 | Temp 98.4°F | Resp 16 | Wt 234.0 lb

## 2013-12-09 DIAGNOSIS — Z23 Encounter for immunization: Secondary | ICD-10-CM

## 2013-12-09 DIAGNOSIS — S8992XD Unspecified injury of left lower leg, subsequent encounter: Secondary | ICD-10-CM | POA: Diagnosis not present

## 2013-12-09 DIAGNOSIS — S86902S Unspecified injury of unspecified muscle(s) and tendon(s) at lower leg level, left leg, sequela: Secondary | ICD-10-CM | POA: Diagnosis not present

## 2013-12-09 DIAGNOSIS — I10 Essential (primary) hypertension: Secondary | ICD-10-CM | POA: Diagnosis not present

## 2013-12-09 DIAGNOSIS — M2190 Unspecified acquired deformity of unspecified limb: Secondary | ICD-10-CM | POA: Diagnosis not present

## 2013-12-09 DIAGNOSIS — R5383 Other fatigue: Secondary | ICD-10-CM

## 2013-12-09 LAB — COMPREHENSIVE METABOLIC PANEL
ALBUMIN: 4.1 g/dL (ref 3.5–5.2)
ALT: 16 U/L (ref 0–53)
AST: 19 U/L (ref 0–37)
Alkaline Phosphatase: 48 U/L (ref 39–117)
BUN: 21 mg/dL (ref 6–23)
CALCIUM: 9.2 mg/dL (ref 8.4–10.5)
CO2: 26 meq/L (ref 19–32)
CREATININE: 0.92 mg/dL (ref 0.50–1.35)
Chloride: 99 mEq/L (ref 96–112)
Glucose, Bld: 164 mg/dL — ABNORMAL HIGH (ref 70–99)
POTASSIUM: 4.4 meq/L (ref 3.5–5.3)
SODIUM: 133 meq/L — AB (ref 135–145)
TOTAL PROTEIN: 7.3 g/dL (ref 6.0–8.3)
Total Bilirubin: 0.6 mg/dL (ref 0.2–1.2)

## 2013-12-09 LAB — CBC
HEMATOCRIT: 44.4 % (ref 39.0–52.0)
Hemoglobin: 15 g/dL (ref 13.0–17.0)
MCH: 30.8 pg (ref 26.0–34.0)
MCHC: 33.8 g/dL (ref 30.0–36.0)
MCV: 91.2 fL (ref 78.0–100.0)
PLATELETS: 207 10*3/uL (ref 150–400)
RBC: 4.87 MIL/uL (ref 4.22–5.81)
RDW: 13.2 % (ref 11.5–15.5)
WBC: 8.1 10*3/uL (ref 4.0–10.5)

## 2013-12-09 MED ORDER — FENTANYL 50 MCG/HR TD PT72: 50.0000 ug | MEDICATED_PATCH | TRANSDERMAL | Status: DC

## 2013-12-09 NOTE — Assessment & Plan Note (Signed)
Stable - Continue fentanyl patches

## 2013-12-09 NOTE — Assessment & Plan Note (Signed)
Elevated today but maybe due to pain.  Was controlled previously - Will follow  BP Readings from Last 3 Encounters:  12/09/13 150/91  07/29/13 141/90  04/24/13 136/87

## 2013-12-09 NOTE — Progress Notes (Signed)
   Subjective:    Patient ID: Andre Sherman, male    DOB: [birth year], 58 y.o.   MRN: [id number]  HPI  HYPERTENSION Disease Monitoring Home BP Monitoring lower 140s-30/60-70s usdually Chest pain- no    Dyspnea- no Medications Compliance-  Knows all medications taking regularly. Lightheadedness-  no  Edema- no ROS - See HPI  PMH Lab Review   Potassium  Date Value Ref Range Status  04/29/2013 5.2  3.5 - 5.3 mEq/L Final     Sodium  Date Value Ref Range Status  04/29/2013 140  135 - 145 mEq/L Final     Creat  Date Value Ref Range Status  04/29/2013 0.91  0.50 - 1.35 mg/dL Final     Creatinine, Ser  Date Value Ref Range Status  01/06/2011 0.89  0.50 - 1.35 mg/dL Final       Chronic Leg Pain Is out of fentanyl patch due to pharmacy complications.  Has significant pain when does not use.  It helps him ambulate and he believes helps keep his blood pressure lower  Fatigue Feels tired and low energy.  Feels could be depression but also seems to be sleeping a lot more.  No suicidal ideation.  Is not working and feels he is bored.  No weight loss or fever or adenopathy  Chief Complaint noted Review of Symptoms - see HPI PMH - Smoking status noted.   Vital Signs reviewed     Review of Systems     Objective:   Physical Exam  Alert no acute distress Psych:  Cognition and judgment appear intact. Alert, communicative  and cooperative with normal attention span and concentration. No apparent delusions, illusions, hallucinations       Assessment & Plan:

## 2013-12-09 NOTE — Assessment & Plan Note (Signed)
Likely due to depression and inactivity but will check labs since just had hep C treatment.

## 2013-12-09 NOTE — Patient Instructions (Signed)
Good to see you today!  Thanks for coming in.  I will call you if your tests are not good.  Otherwise I will send you a letter.  If you do not hear from me with in 2 weeks please call our office.     You should have enough patches to last 90 days  Come back if you are feeling worse or have weight loss or fever  I think it sounds like a great idea to either volunteer and or get a part time job  You are down to 1/2 pack - Pick a quit date - consider Thanksgiving - put the money you would spend in a jar

## 2013-12-12 ENCOUNTER — Encounter: Payer: Self-pay | Admitting: Family Medicine

## 2014-02-07 DIAGNOSIS — Z8619 Personal history of other infectious and parasitic diseases: Secondary | ICD-10-CM | POA: Diagnosis not present

## 2014-02-07 DIAGNOSIS — Y9289 Other specified places as the place of occurrence of the external cause: Secondary | ICD-10-CM | POA: Insufficient documentation

## 2014-02-07 DIAGNOSIS — Y998 Other external cause status: Secondary | ICD-10-CM | POA: Insufficient documentation

## 2014-02-07 DIAGNOSIS — I10 Essential (primary) hypertension: Secondary | ICD-10-CM | POA: Insufficient documentation

## 2014-02-07 DIAGNOSIS — Z23 Encounter for immunization: Secondary | ICD-10-CM | POA: Diagnosis not present

## 2014-02-07 DIAGNOSIS — M25579 Pain in unspecified ankle and joints of unspecified foot: Secondary | ICD-10-CM | POA: Diagnosis not present

## 2014-02-07 DIAGNOSIS — S91012A Laceration without foreign body, left ankle, initial encounter: Secondary | ICD-10-CM | POA: Diagnosis not present

## 2014-02-07 DIAGNOSIS — Z8781 Personal history of (healed) traumatic fracture: Secondary | ICD-10-CM | POA: Insufficient documentation

## 2014-02-07 DIAGNOSIS — Z7982 Long term (current) use of aspirin: Secondary | ICD-10-CM | POA: Insufficient documentation

## 2014-02-07 DIAGNOSIS — Y9389 Activity, other specified: Secondary | ICD-10-CM | POA: Diagnosis not present

## 2014-02-07 DIAGNOSIS — S99912A Unspecified injury of left ankle, initial encounter: Secondary | ICD-10-CM | POA: Diagnosis present

## 2014-02-07 DIAGNOSIS — Z79899 Other long term (current) drug therapy: Secondary | ICD-10-CM | POA: Insufficient documentation

## 2014-02-07 DIAGNOSIS — Z720 Tobacco use: Secondary | ICD-10-CM | POA: Diagnosis not present

## 2014-02-07 DIAGNOSIS — T7411XA Adult physical abuse, confirmed, initial encounter: Secondary | ICD-10-CM | POA: Diagnosis not present

## 2014-02-07 DIAGNOSIS — Z880 Allergy status to penicillin: Secondary | ICD-10-CM | POA: Insufficient documentation

## 2014-02-08 ENCOUNTER — Encounter (HOSPITAL_COMMUNITY): Payer: Self-pay | Admitting: Emergency Medicine

## 2014-02-08 ENCOUNTER — Emergency Department (HOSPITAL_COMMUNITY)
Admission: EM | Admit: 2014-02-08 | Discharge: 2014-02-08 | Disposition: A | Discharge status: Home / Self Care | Payer: Medicare Other | Attending: Emergency Medicine | Admitting: Emergency Medicine

## 2014-02-08 ENCOUNTER — Emergency Department (HOSPITAL_COMMUNITY): Payer: Medicare Other

## 2014-02-08 DIAGNOSIS — S91012A Laceration without foreign body, left ankle, initial encounter: Secondary | ICD-10-CM | POA: Diagnosis not present

## 2014-02-08 DIAGNOSIS — S91019A Laceration without foreign body, unspecified ankle, initial encounter: Secondary | ICD-10-CM

## 2014-02-08 HISTORY — DX: Accidental discharge from unspecified firearms or gun, initial encounter: W34.00XA

## 2014-02-08 HISTORY — DX: Puncture wound without foreign body, unspecified thigh, initial encounter: S71.139A

## 2014-02-08 MED ORDER — LIDOCAINE-EPINEPHRINE (PF) 2 %-1:200000 IJ SOLN
10.0000 mL | Freq: Once | INTRAMUSCULAR | Status: AC
  Administered 2014-02-08: 10 mL
  Filled 2014-02-08: qty 20

## 2014-02-08 MED ORDER — TETANUS-DIPHTH-ACELL PERTUSSIS 5-2.5-18.5 LF-MCG/0.5 IM SUSP
0.5000 mL | Freq: Once | INTRAMUSCULAR | Status: AC
  Administered 2014-02-08: 0.5 mL via INTRAMUSCULAR
  Filled 2014-02-08: qty 0.5

## 2014-02-08 NOTE — ED Notes (Signed)
Bed: WA20 Expected date:  Expected time:  Means of arrival:  Comments: 
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
## 2014-02-08 NOTE — ED Notes (Signed)
Kelly requested pt be moved to the back for sutures. Report given to [place]. Pt ambulated to room 20 without difficulty.

## 2014-02-08 NOTE — Discharge Instructions (Signed)
Laceration Care, Adult °A laceration is a cut or lesion that goes through all layers of the skin and into the tissue just beneath the skin. °TREATMENT  °Some lacerations may not require closure. Some lacerations may not be able to be closed due to an increased risk of infection. It is important to see your caregiver as soon as possible after an injury to minimize the risk of infection and maximize the opportunity for successful closure. °If closure is appropriate, pain medicines may be given, if needed. The wound will be cleaned to help prevent infection. Your caregiver will use stitches (sutures), staples, wound glue (adhesive), or skin adhesive strips to repair the laceration. These tools bring the skin edges together to allow for faster healing and a better cosmetic outcome. However, all wounds will heal with a scar. Once the wound has healed, scarring can be minimized by covering the wound with sunscreen during the day for 1 full year. °HOME CARE INSTRUCTIONS  °For sutures or staples: °· Keep the wound clean and dry. °· If you were given a bandage (dressing), you should change it at least once a day. Also, change the dressing if it becomes wet or dirty, or as directed by your caregiver. °· Wash the wound with soap and water 2 times a day. Rinse the wound off with water to remove all soap. Pat the wound dry with a clean towel. °· After cleaning, apply a thin layer of the antibiotic ointment as recommended by your caregiver. This will help prevent infection and keep the dressing from sticking. °· You may shower as usual after the first 24 hours. Do not soak the wound in water until the sutures are removed. °· Only take over-the-counter or prescription medicines for pain, discomfort, or fever as directed by your caregiver. °· Get your sutures or staples removed as directed by your caregiver. °For skin adhesive strips: °· Keep the wound clean and dry. °· Do not get the skin adhesive strips wet. You may bathe  carefully, using caution to keep the wound dry. °· If the wound gets wet, pat it dry with a clean towel. °· Skin adhesive strips will fall off on their own. You may trim the strips as the wound heals. Do not remove skin adhesive strips that are still stuck to the wound. They will fall off in time. °For wound adhesive: °· You may briefly wet your wound in the shower or bath. Do not soak or scrub the wound. Do not swim. Avoid periods of heavy perspiration until the skin adhesive has fallen off on its own. After showering or bathing, gently pat the wound dry with a clean towel. °· Do not apply liquid medicine, cream medicine, or ointment medicine to your wound while the skin adhesive is in place. This may loosen the film before your wound is healed. °· If a dressing is placed over the wound, be careful not to apply tape directly over the skin adhesive. This may cause the adhesive to be pulled off before the wound is healed. °· Avoid prolonged exposure to sunlight or tanning lamps while the skin adhesive is in place. Exposure to ultraviolet light in the first year will darken the scar. °· The skin adhesive will usually remain in place for 5 to 10 days, then naturally fall off the skin. Do not pick at the adhesive film. °You may need a tetanus shot if: °· You cannot remember when you had your last tetanus shot. °· You have never had a tetanus   shot. If you get a tetanus shot, your arm may swell, get red, and feel warm to the touch. This is common and not a problem. If you need a tetanus shot and you choose not to have one, there is a rare chance of getting tetanus. Sickness from tetanus can be serious. SEEK MEDICAL CARE IF:   You have redness, swelling, or increasing pain in the wound.  You see a red line that goes away from the wound.  You have yellowish-white fluid (pus) coming from the wound.  You have a fever.  You notice a bad smell coming from the wound or dressing.  Your wound breaks open before or  after sutures have been removed.  You notice something coming out of the wound such as wood or glass.  Your wound is on your hand or foot and you cannot move a finger or toe. SEEK IMMEDIATE MEDICAL CARE IF:   Your pain is not controlled with prescribed medicine.  You have severe swelling around the wound causing pain and numbness or a change in color in your arm, hand, leg, or foot.  Your wound splits open and starts bleeding.  You have worsening numbness, weakness, or loss of function of any joint around or beyond the wound.  You develop painful lumps near the wound or on the skin anywhere on your body. MAKE SURE YOU:   Understand these instructions.  Will watch your condition.  Will get help right away if you are not doing well or get worse. Document Released: 02/14/2005 Document Revised: 05/09/2011 Document Reviewed: 08/10/2010 ExitCare Patient Information 2015 [employer]. This information is not intended to replace advice given to you by your health care provider. Make sure you discuss any questions you have with your health care provider. RICE: Routine Care for Injuries The routine care of many injuries includes Rest, Ice, Compression, and Elevation (RICE). HOME CARE INSTRUCTIONS  Rest is needed to allow your body to heal. Routine activities can usually be resumed when comfortable. Injured tendons and bones can take up to 6 weeks to heal. Tendons are the cord-like structures that attach muscle to bone.  Ice following an injury helps keep the swelling down and reduces pain.  Put ice in a plastic bag.  Place a towel between your skin and the bag.  Leave the ice on for 15-20 minutes, 3-4 times a day, or as directed by your health care provider. Do this while awake, for the first 24 to 48 hours. After that, continue as directed by your caregiver.  Compression helps keep swelling down. It also gives support and helps with discomfort. If an elastic bandage has been applied,  it should be removed and reapplied every 3 to 4 hours. It should not be applied tightly, but firmly enough to keep swelling down. Watch fingers or toes for swelling, bluish discoloration, coldness, numbness, or excessive pain. If any of these problems occur, remove the bandage and reapply loosely. Contact your caregiver if these problems continue.  Elevation helps reduce swelling and decreases pain. With extremities, such as the arms, hands, legs, and feet, the injured area should be placed near or above the level of the heart, if possible. SEEK IMMEDIATE MEDICAL CARE IF:  You have persistent pain and swelling.  You develop redness, numbness, or unexpected weakness.  Your symptoms are getting worse rather than improving after several days. These symptoms may indicate that further evaluation or further X-rays are needed. Sometimes, X-rays may not show a small broken bone (  fracture) until 1 week or 10 days later. Make a follow-up appointment with your caregiver. Ask when your X-ray results will be ready. Make sure you get your X-ray results. Document Released: 05/29/2000 Document Revised: 02/19/2013 Document Reviewed: 07/16/2010 Exit[employer] Patient Information 2015 [employer]. This information is not intended to replace advice given to you by your health care provider. Make sure you discuss any questions you have with your health care provider.  
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
## 2014-02-19 ENCOUNTER — Other Ambulatory Visit: Payer: Self-pay | Admitting: Family Medicine

## 2014-03-01 ENCOUNTER — Emergency Department (INDEPENDENT_AMBULATORY_CARE_PROVIDER_SITE_OTHER)
Admission: EM | Admit: 2014-03-01 | Discharge: 2014-03-01 | Disposition: A | Discharge status: Home / Self Care | Payer: Medicare Other | Source: Home / Self Care | Attending: Emergency Medicine | Admitting: Emergency Medicine

## 2014-03-01 ENCOUNTER — Encounter (HOSPITAL_COMMUNITY): Payer: Self-pay

## 2014-03-01 DIAGNOSIS — Z4802 Encounter for removal of sutures: Secondary | ICD-10-CM | POA: Diagnosis not present

## 2014-03-01 DIAGNOSIS — L03116 Cellulitis of left lower limb: Secondary | ICD-10-CM

## 2014-03-01 MED ORDER — CEFTRIAXONE SODIUM 1 G IJ SOLR
INTRAMUSCULAR | Status: AC
  Filled 2014-03-01: qty 10

## 2014-03-01 MED ORDER — CEFTRIAXONE SODIUM 1 G IJ SOLR
1.0000 g | Freq: Once | INTRAMUSCULAR | Status: AC
  Administered 2014-03-01: 1 g via INTRAMUSCULAR

## 2014-03-01 MED ORDER — LIDOCAINE HCL (PF) 1 % IJ SOLN
INTRAMUSCULAR | Status: AC
  Filled 2014-03-01: qty 5

## 2014-03-01 MED ORDER — CEPHALEXIN 500 MG PO CAPS: 500.0000 mg | ORAL_CAPSULE | Freq: Four times a day (QID) | ORAL | Status: DC
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
## 2014-03-01 NOTE — Discharge Instructions (Signed)
Take the keflex 4 times a day for 10 days. If the redness is not improving by Monday, please come back for recheck. If you get disoriented, develop fevers, or start vomiting, please go to the emergency room right away.
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
## 2014-03-05 ENCOUNTER — Encounter (HOSPITAL_COMMUNITY): Payer: Self-pay | Admitting: Emergency Medicine

## 2014-03-05 ENCOUNTER — Emergency Department (INDEPENDENT_AMBULATORY_CARE_PROVIDER_SITE_OTHER)
Admission: EM | Admit: 2014-03-05 | Discharge: 2014-03-05 | Disposition: A | Discharge status: Home / Self Care | Payer: Medicare Other | Source: Home / Self Care | Attending: Family Medicine | Admitting: Family Medicine

## 2014-03-05 DIAGNOSIS — L03116 Cellulitis of left lower limb: Secondary | ICD-10-CM | POA: Diagnosis not present

## 2014-03-05 DIAGNOSIS — Z87828 Personal history of other (healed) physical injury and trauma: Secondary | ICD-10-CM

## 2014-03-05 NOTE — Discharge Instructions (Signed)
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
## 2014-03-05 NOTE — ED Notes (Signed)
Pt verbalized understanding of all discharge information. Pt unable to sign e-signature due to computer error.
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
## 2014-03-12 ENCOUNTER — Ambulatory Visit (INDEPENDENT_AMBULATORY_CARE_PROVIDER_SITE_OTHER): Payer: Medicare Other | Admitting: Family Medicine

## 2014-03-12 ENCOUNTER — Encounter: Payer: Self-pay | Admitting: Family Medicine

## 2014-03-12 VITALS — BP 118/72 | HR 96 | Temp 98.1°F | Ht 70.0 in | Wt 234.0 lb

## 2014-03-12 DIAGNOSIS — R739 Hyperglycemia, unspecified: Secondary | ICD-10-CM | POA: Diagnosis not present

## 2014-03-12 DIAGNOSIS — Z720 Tobacco use: Secondary | ICD-10-CM

## 2014-03-12 DIAGNOSIS — S86902S Unspecified injury of unspecified muscle(s) and tendon(s) at lower leg level, left leg, sequela: Secondary | ICD-10-CM | POA: Diagnosis not present

## 2014-03-12 DIAGNOSIS — Z7189 Other specified counseling: Secondary | ICD-10-CM

## 2014-03-12 DIAGNOSIS — M2190 Unspecified acquired deformity of unspecified limb: Secondary | ICD-10-CM | POA: Diagnosis not present

## 2014-03-12 DIAGNOSIS — R5383 Other fatigue: Secondary | ICD-10-CM | POA: Diagnosis not present

## 2014-03-12 DIAGNOSIS — I10 Essential (primary) hypertension: Secondary | ICD-10-CM

## 2014-03-12 DIAGNOSIS — F17200 Nicotine dependence, unspecified, uncomplicated: Secondary | ICD-10-CM

## 2014-03-12 DIAGNOSIS — G8929 Other chronic pain: Secondary | ICD-10-CM

## 2014-03-12 MED ORDER — FENTANYL 50 MCG/HR TD PT72: 50.0000 ug | MEDICATED_PATCH | TRANSDERMAL | Status: DC

## 2014-03-12 NOTE — Progress Notes (Signed)
   Subjective:    Patient ID: Andre Sherman, male    DOB: [birth year], 59 y.o.   MRN: [id number]  HPI  Leg Pain No change.  Still using fenatnyl patch q 48 hours.   Enables him to walk dog and remain active  Fatigue  about the same.  No new symptoms of weight loss or fever or nausea or vomiting or bleeding or pain.  Feels that he is mildly depressed as is usual for him but not new or worsened.  Realizes he needs to keep active  HYPERTENSION Disease Monitoring Home BP Monitoring not checking Chest pain- no    Dyspnea- no Medications Compliance-  daily. Lightheadedness-  no  Edema- no  ROS - See HPI  PMH Lab Review   POTASSIUM  Date Value Ref Range Status  12/09/2013 4.4 3.5 - 5.3 mEq/L Final   SODIUM  Date Value Ref Range Status  12/09/2013 133* 135 - 145 mEq/L Final   CREAT  Date Value Ref Range Status  12/09/2013 0.92 0.50 - 1.35 mg/dL Final   CREATININE, SER  Date Value Ref Range Status  01/06/2011 0.89 0.50 - 1.35 mg/dL Final     Chief Complaint noted Review of Symptoms - see HPI PMH - Smoking status noted.   Vital Signs reviewed        Review of Systems     Objective:   Physical Exam  Alert no acute distress.  Good eye contact and jokes occasionally  Wearing compression stocking on left leg  Psych:  Cognition and judgment appear intact. Alert, communicative  and cooperative with normal attention span and concentration. No apparent delusions, illusions, hallucinations     Assessment & Plan:

## 2014-03-12 NOTE — Assessment & Plan Note (Signed)
BP Readings from Last 3 Encounters:  03/12/14 118/72  03/05/14 111/70  03/01/14 156/66    Good control

## 2014-03-12 NOTE — Assessment & Plan Note (Signed)
Using vape and trying to cut down - is at 10 cig per day.  Discussed approaches

## 2014-03-12 NOTE — Assessment & Plan Note (Signed)
Stable.  More related to mood that any focal illness.  Discussed approaches

## 2014-03-12 NOTE — Assessment & Plan Note (Signed)
Seems to be taking appropriately.  We discussed increasing the time between patches as he realizes the medication might be making his depression worse.  He will try   Indication for chronic opioid: chronic leg injury Medication and dose: fentanyl patch q 48 hrs # pills per month: 15 patches Last UDS date: not indicated  Pain contract signed (Y/N): not indicated  Date narcotic database last reviewed (include red flags): 03-12-14

## 2014-03-12 NOTE — Patient Instructions (Signed)
Good to see you today!  Thanks for coming in.  Come back in 3 months or sooner if feeling worse  Aim is to be cigarette free on next visit??  Exercise - walk every day  A job or volunteering would be great for you  I will call you if your tests are not good.  Otherwise I will send you a letter.  If you do not hear from me with in 2 weeks please call our office.

## 2014-03-12 NOTE — Assessment & Plan Note (Signed)
On last bmet.  Recheck.  Could not get A1c today due to lab issues.

## 2014-03-13 LAB — BASIC METABOLIC PANEL
BUN: 18 mg/dL (ref 6–23)
CO2: 27 mEq/L (ref 19–32)
CREATININE: 0.91 mg/dL (ref 0.50–1.35)
Calcium: 10 mg/dL (ref 8.4–10.5)
Chloride: 100 mEq/L (ref 96–112)
GLUCOSE: 130 mg/dL — AB (ref 70–99)
Potassium: 4.7 mEq/L (ref 3.5–5.3)
Sodium: 136 mEq/L (ref 135–145)

## 2014-03-14 ENCOUNTER — Encounter: Payer: Self-pay | Admitting: Family Medicine

## 2014-06-11 ENCOUNTER — Other Ambulatory Visit: Payer: Self-pay | Admitting: Family Medicine

## 2014-06-11 ENCOUNTER — Encounter: Payer: Self-pay | Admitting: Family Medicine

## 2014-06-11 ENCOUNTER — Ambulatory Visit (INDEPENDENT_AMBULATORY_CARE_PROVIDER_SITE_OTHER): Payer: Medicare Other | Admitting: Family Medicine

## 2014-06-11 VITALS — BP 134/80 | HR 67 | Temp 98.7°F | Ht 70.0 in | Wt 235.2 lb

## 2014-06-11 DIAGNOSIS — I10 Essential (primary) hypertension: Secondary | ICD-10-CM | POA: Diagnosis not present

## 2014-06-11 DIAGNOSIS — S86902S Unspecified injury of unspecified muscle(s) and tendon(s) at lower leg level, left leg, sequela: Secondary | ICD-10-CM

## 2014-06-11 MED ORDER — FENTANYL 50 MCG/HR TD PT72: MEDICATED_PATCH | TRANSDERMAL | Status: DC

## 2014-06-11 NOTE — Patient Instructions (Signed)
Good to see you today!  Thanks for coming in.  Use the patches as we discussed  See you back in 3 months  Work on smoking!

## 2014-06-12 NOTE — Assessment & Plan Note (Addendum)
Not at goal with medication management. stable pain. Will start slow taper to get fentanyl patch to q 72 hours over the next 3 months.   He is reluctant but seems to understand reasoning.  May want to swithc to prn oral meds in future

## 2014-06-12 NOTE — Assessment & Plan Note (Signed)
Controlled.  

## 2014-06-12 NOTE — Progress Notes (Signed)
   Subjective:    Patient ID: Andre Sherman, male    DOB: November 29, 1955, 60 y.o.   MRN: [id number]  HPI  Chronic Leg Pain Still bothers him frequently.  Patches help.   No weakness or redness of leg.   Continues to take Elavil and gabapentin regularly  PMH - has been using fentanyl patches q 48 hours since started by pain center.  HYPERTENSION Disease Monitoring Home BP Monitoring not taking Chest pain- no    Dyspnea- no Medications Compliance-  Knows all his meds. Lightheadedness-  no  Edema- no ROS - See HPI  PMH Lab Review   POTASSIUM  Date Value Ref Range Status  03/12/2014 4.7 3.5 - 5.3 mEq/L Final   SODIUM  Date Value Ref Range Status  03/12/2014 136 135 - 145 mEq/L Final   CREAT  Date Value Ref Range Status  03/12/2014 0.91 0.50 - 1.35 mg/dL Final   CREATININE, SER  Date Value Ref Range Status  01/06/2011 0.89 0.50 - 1.35 mg/dL Final       Chief Complaint noted Review of Symptoms - see HPI PMH - Smoking status noted.   Vital Signs reviewed     Review of Systems     Objective:   Physical Exam  Alert interactive       Assessment & Plan:

## 2014-07-07 ENCOUNTER — Other Ambulatory Visit: Payer: Self-pay | Admitting: Family Medicine

## 2014-09-08 ENCOUNTER — Other Ambulatory Visit: Payer: Self-pay | Admitting: *Deleted

## 2014-09-08 DIAGNOSIS — M2190 Unspecified acquired deformity of unspecified limb: Secondary | ICD-10-CM

## 2014-09-08 NOTE — Telephone Encounter (Signed)
LM for patient to call back.  We have rescheduled him to see Dr. Erin Hearing on Wednesday.  [place],CMA

## 2014-09-08 NOTE — Telephone Encounter (Signed)
LM for patient to call back.  Will need to clarify which medications patient needs refilled.  Will forward to MD to see if he would be willing to refill medication until patient able to see him.  Jazmin Hartsell,CMA

## 2014-09-09 ENCOUNTER — Ambulatory Visit: Payer: Medicare Other | Admitting: Family Medicine

## 2014-09-10 ENCOUNTER — Encounter: Payer: Self-pay | Admitting: Family Medicine

## 2014-09-10 ENCOUNTER — Ambulatory Visit (INDEPENDENT_AMBULATORY_CARE_PROVIDER_SITE_OTHER): Payer: Medicare Other | Admitting: Family Medicine

## 2014-09-10 VITALS — BP 124/84 | HR 79 | Temp 98.6°F | Ht 70.0 in | Wt 239.1 lb

## 2014-09-10 DIAGNOSIS — M2190 Unspecified acquired deformity of unspecified limb: Secondary | ICD-10-CM

## 2014-09-10 DIAGNOSIS — S86902S Unspecified injury of unspecified muscle(s) and tendon(s) at lower leg level, left leg, sequela: Secondary | ICD-10-CM

## 2014-09-10 DIAGNOSIS — R739 Hyperglycemia, unspecified: Secondary | ICD-10-CM | POA: Diagnosis not present

## 2014-09-10 DIAGNOSIS — I10 Essential (primary) hypertension: Secondary | ICD-10-CM

## 2014-09-10 LAB — POCT GLYCOSYLATED HEMOGLOBIN (HGB A1C): HEMOGLOBIN A1C: 6.1

## 2014-09-10 MED ORDER — FENTANYL 50 MCG/HR TD PT72: 50.0000 ug | MEDICATED_PATCH | TRANSDERMAL | Status: DC

## 2014-09-10 MED ORDER — HYDROCODONE-ACETAMINOPHEN 5-325 MG PO TABS: 1.0000 | ORAL_TABLET | Freq: Every day | ORAL | Status: DC | PRN

## 2014-09-10 NOTE — Progress Notes (Signed)
   Subjective:    Patient ID: Andre Sherman, male    DOB: [birth year], 59 y.o.   MRN: [id number]  HPI  Chronic Pain from leg Barely tolerating the decrease in frequency of fentanyl patch.  Has episodic pain near end of patch duration.   Using elavil regularly and neurontin  HYPERTENSION Disease Monitoring Home BP Monitoring Forgot his readings.  Did bring his meter which measures 4-7 mm HG higher than office device Chest pain- no    Dyspnea- no Medications Compliance-  Knows medications did not bring in. Lightheadedness-  no  Edema- no ROS - See HPI  PMH Lab Review   POTASSIUM  Date Value Ref Range Status  03/12/2014 4.7 3.5 - 5.3 mEq/L Final   SODIUM  Date Value Ref Range Status  03/12/2014 136 135 - 145 mEq/L Final   CREAT  Date Value Ref Range Status  03/12/2014 0.91 0.50 - 1.35 mg/dL Final   CREATININE, SER  Date Value Ref Range Status  01/06/2011 0.89 0.50 - 1.35 mg/dL Final        Chief Complaint noted Review of Symptoms - see HPI PMH - Smoking status noted.   Vital Signs reviewed    Review of Systems     Objective:   Physical Exam  Alert nad       Assessment & Plan:

## 2014-09-10 NOTE — Assessment & Plan Note (Signed)
A1c 6.1 today will monitor and enourage weight loss

## 2014-09-10 NOTE — Assessment & Plan Note (Signed)
Controlled today.  Monitor at home

## 2014-09-10 NOTE — Patient Instructions (Signed)
Good to see you today!  Thanks for coming in.  Your blood pressure machine seems to read a little higher than ours  Take your blood pressure twice daily for the next week then call in a give the numbers to our front office ladies  Use the extra hydrocodone to help when the patch seems to wear out.  The 25 tabs should last 3 months and then you should not need anymore  Come back in 3 months

## 2014-09-10 NOTE — Assessment & Plan Note (Signed)
Pain is somewhat controlled.  Will continue to Rx patches at q 72 hours and give 25 hydrocodone to help taper.  See AVS

## 2014-09-14 ENCOUNTER — Other Ambulatory Visit: Payer: Self-pay | Admitting: Family Medicine

## 2014-10-10 DIAGNOSIS — Z23 Encounter for immunization: Secondary | ICD-10-CM | POA: Diagnosis not present

## 2014-11-28 ENCOUNTER — Other Ambulatory Visit: Payer: Self-pay | Admitting: Family Medicine

## 2014-12-06 ENCOUNTER — Other Ambulatory Visit: Payer: Self-pay | Admitting: Family Medicine

## 2014-12-10 ENCOUNTER — Encounter: Payer: Self-pay | Admitting: Family Medicine

## 2014-12-10 ENCOUNTER — Other Ambulatory Visit: Payer: Self-pay | Admitting: Family Medicine

## 2014-12-10 ENCOUNTER — Ambulatory Visit (INDEPENDENT_AMBULATORY_CARE_PROVIDER_SITE_OTHER): Payer: Medicare Other | Admitting: Family Medicine

## 2014-12-10 VITALS — BP 148/95 | HR 70 | Temp 97.8°F | Ht 70.0 in | Wt 238.0 lb

## 2014-12-10 DIAGNOSIS — S86901S Unspecified injury of unspecified muscle(s) and tendon(s) at lower leg level, right leg, sequela: Secondary | ICD-10-CM

## 2014-12-10 DIAGNOSIS — F329 Major depressive disorder, single episode, unspecified: Secondary | ICD-10-CM | POA: Diagnosis not present

## 2014-12-10 DIAGNOSIS — I10 Essential (primary) hypertension: Secondary | ICD-10-CM

## 2014-12-10 DIAGNOSIS — F32A Depression, unspecified: Secondary | ICD-10-CM

## 2014-12-10 MED ORDER — FENTANYL 50 MCG/HR TD PT72: 50.0000 ug | MEDICATED_PATCH | TRANSDERMAL | Status: DC

## 2014-12-10 NOTE — Assessment & Plan Note (Signed)
Mildly worsened with recent traumatic death of his daughter.  Seems to be handling well.  Will see back in one month to check

## 2014-12-10 NOTE — Assessment & Plan Note (Signed)
Not at goal but did not take his medications this am and likely has been missing some with recent stressor of daughter death.  Will monitor

## 2014-12-10 NOTE — Progress Notes (Signed)
   Subjective:    Patient ID: Andre Sherman, male    DOB: Nov 26, 1955, 58 y.o.   MRN: [id number]  HPI  DEPRESSION His dtr was killled a month ago in a MVA.  He is distraught but is keeping going for his grandkids.  Disrupted sleep and appetitie which is gettiong a little better.  No suicidal ideation.  Taking his amitryptylene regularly  Chronic Pain from leg Tolerating the decrease in frequency of fentanyl patch.  Still Has episodic pain near end of patch duration.   Using elavil regularly and neurontin.  No redness or focal pain   HYPERTENSION Disease Monitoring Home BP Monitoring Forgot his readings.   Chest pain- no    Dyspnea- no Medications Compliance-  Knows medications but did not bring in. Lightheadedness-  no  Edema- no ROS - See HPI    Chief Complaint noted Review of Symptoms - see HPI PMH - Smoking status noted.   Vital Signs reviewed  Review of Systems     Objective:   Physical Exam Psych:  Cognition and judgment appear intact. Alert, communicative  and cooperative with normal attention span and concentration. No apparent delusions, illusions, hallucinations Appropriately sad        Assessment & Plan:

## 2014-12-10 NOTE — Patient Instructions (Signed)
  My thoughts are with you  Please come back in 1 month or sooner as needed.     Please bring all your medications next visit

## 2014-12-10 NOTE — Assessment & Plan Note (Signed)
Tolerating decreased frequency of fentanyl patch.  Other wise stable

## 2015-01-07 ENCOUNTER — Telehealth: Payer: Self-pay | Admitting: Family Medicine

## 2015-01-07 ENCOUNTER — Ambulatory Visit: Payer: Medicare Other | Admitting: Family Medicine

## 2015-01-07 NOTE — Telephone Encounter (Signed)
Called number listed AM for Roseanna Rainbow answered Did not leave any information

## 2015-02-10 ENCOUNTER — Telehealth: Payer: Self-pay | Admitting: Family Medicine

## 2015-02-10 NOTE — Telephone Encounter (Signed)
Pt has lost his fentanyl patches.  He will be here first thing in the morning in hopes of being seen.  He should have changed his patch today.   He can be reached at [phone]   Or [phone]

## 2015-02-10 NOTE — Telephone Encounter (Signed)
Will forward to MD to make him aware of this.  Jazmin Hartsell,CMA  

## 2015-02-11 ENCOUNTER — Ambulatory Visit (INDEPENDENT_AMBULATORY_CARE_PROVIDER_SITE_OTHER): Payer: Medicare Other | Admitting: Family Medicine

## 2015-02-11 ENCOUNTER — Encounter: Payer: Self-pay | Admitting: Family Medicine

## 2015-02-11 VITALS — BP 139/74 | HR 83 | Temp 98.3°F | Wt 241.0 lb

## 2015-02-11 DIAGNOSIS — S86902S Unspecified injury of unspecified muscle(s) and tendon(s) at lower leg level, left leg, sequela: Secondary | ICD-10-CM

## 2015-02-11 DIAGNOSIS — F329 Major depressive disorder, single episode, unspecified: Secondary | ICD-10-CM | POA: Diagnosis not present

## 2015-02-11 DIAGNOSIS — F32A Depression, unspecified: Secondary | ICD-10-CM

## 2015-02-11 MED ORDER — FENTANYL 50 MCG/HR TD PT72: 50.0000 ug | MEDICATED_PATCH | TRANSDERMAL | Status: DC

## 2015-02-11 NOTE — Progress Notes (Signed)
   Subjective:    Patient ID: Andre Sherman, male    DOB: [birth year], 59 y.o.   MRN: [id number]  HPI  Leg Pain Worsened.  Is about to run out of his patch.  Could not find the paper RX that was for today. Did not fill it just lost the paper.  No change in type of pain or any weakness.  No swelling or redness or fever  Depression Feels very down given his recent loss and problems with his sons.  No suicidal ideation and would call someone if felt so.  Taking medications regularly.  Taking care of his grand daughter who makes him feel better. Appetite is fair as is his sleep   Chief Complaint noted Review of Symptoms - see HPI PMH - Smoking status noted.   Vital Signs reviewed   Review of Systems     Objective:   Physical Exam Alert nad  Psych:  Cognition and judgment appear intact. Alert, communicative  and cooperative with normal attention span and concentration. No apparent delusions, illusions, hallucinations       Assessment & Plan:

## 2015-02-11 NOTE — Assessment & Plan Note (Addendum)
Stable.  Continue medications and follow up next month.  He is not interested in seeing a counselor currently

## 2015-02-11 NOTE — Patient Instructions (Signed)
Happy Holidays  I wrote you another one month Rx for 10 patches.   If you find the lost prescription - bring it by our practice  Come back in 1 month for your regular visit

## 2015-02-11 NOTE — Assessment & Plan Note (Signed)
Worsened due to being out of medications.  I check the [place] drug database and last Rx fill for fentanyl was 11/12.  Wrote another Rx for one month

## 2015-02-16 ENCOUNTER — Other Ambulatory Visit: Payer: Self-pay | Admitting: Family Medicine

## 2015-03-11 ENCOUNTER — Ambulatory Visit (INDEPENDENT_AMBULATORY_CARE_PROVIDER_SITE_OTHER): Payer: Medicare Other | Admitting: Family Medicine

## 2015-03-11 ENCOUNTER — Encounter: Payer: Self-pay | Admitting: Family Medicine

## 2015-03-11 VITALS — BP 128/98 | HR 69 | Temp 98.2°F | Wt 234.0 lb

## 2015-03-11 DIAGNOSIS — I10 Essential (primary) hypertension: Secondary | ICD-10-CM

## 2015-03-11 DIAGNOSIS — F329 Major depressive disorder, single episode, unspecified: Secondary | ICD-10-CM

## 2015-03-11 DIAGNOSIS — F32A Depression, unspecified: Secondary | ICD-10-CM

## 2015-03-11 MED ORDER — FENTANYL 50 MCG/HR TD PT72: 50.0000 ug | MEDICATED_PATCH | TRANSDERMAL | Status: DC

## 2015-03-11 NOTE — Patient Instructions (Signed)
Good to see you today!  Thanks for coming in.  Come back in 1 month for a blood pressure check  I would be happy to see your son - he can make an appointment.  If any problems call and I will speak with our receptionists  Call or come back if you are feeling suddenly worse

## 2015-03-12 NOTE — Assessment & Plan Note (Signed)
BP Readings from Last 3 Encounters:  03/11/15 128/98  02/11/15 139/74  12/10/14 148/95   Not at goal today might be because took medications just before office visit.  He will keep list of home bps and bring i

## 2015-03-12 NOTE — Progress Notes (Signed)
   Subjective:    Patient ID: Andre Sherman, male    DOB: 03/14/1955, 60 y.o.   MRN: [id number]  HPI  Depression Feeling down during the holidays and lots of family stressors with daughter recent death and 2 sons with mental illness.   No suicidal ideation.  He is sleeping OK.  Does not feel like eating much but not losing weight.  Bored at home but not much luck finding work.   Takiing elavil regularly.    Leg Pain Stable.  Using fentanyl as directed. Gaba pentin regularly Always has pain but does not matter much if erect or sitting.  No redness or new edema  HYPERTENSION Disease Monitoring Home BP Monitoring (Severity) not checking but has cuff Symptoms - Chest pain- no    Dyspnea- no Medications(Modifying factors) Compliance-  Just took before came to office. Lightheadedness-  no  Edema- no Timing - continuous  Duration - years ROS - See HPI  PMH Lab Review   POTASSIUM  Date Value Ref Range Status  03/12/2014 4.7 3.5 - 5.3 mEq/L Final   SODIUM  Date Value Ref Range Status  03/12/2014 136 135 - 145 mEq/L Final   CREAT  Date Value Ref Range Status  03/12/2014 0.91 0.50 - 1.35 mg/dL Final   CREATININE, SER  Date Value Ref Range Status  01/06/2011 0.89 0.50 - 1.35 mg/dL Final       Chief Complaint noted Review of Symptoms - see HPI PMH - Smoking status noted.   Vital Signs reviewed    Review of Systems     Objective:   Physical Exam  Psych:  Cognition and judgment appear intact. Alert, communicative  and cooperative with normal attention span and concentration. No apparent delusions, illusions, hallucinations       Assessment & Plan:

## 2015-03-12 NOTE — Assessment & Plan Note (Signed)
Stable.  Last visit I wrote a replacement Rx for one he lost.  He found this one and brought in today.  I destroyed it - fentanyl 50 mcg #10 fill after 60 days dated 12-10-14

## 2015-03-12 NOTE — Assessment & Plan Note (Signed)
Not well controlled but stable - so far not interested in a counselor. continue meds

## 2015-06-03 ENCOUNTER — Encounter: Payer: Self-pay | Admitting: Family Medicine

## 2015-06-03 ENCOUNTER — Ambulatory Visit (INDEPENDENT_AMBULATORY_CARE_PROVIDER_SITE_OTHER): Payer: Medicare Other | Admitting: Family Medicine

## 2015-06-03 VITALS — BP 117/71 | HR 76 | Temp 98.1°F | Wt 234.0 lb

## 2015-06-03 DIAGNOSIS — S86902S Unspecified injury of unspecified muscle(s) and tendon(s) at lower leg level, left leg, sequela: Secondary | ICD-10-CM

## 2015-06-03 DIAGNOSIS — F17200 Nicotine dependence, unspecified, uncomplicated: Secondary | ICD-10-CM

## 2015-06-03 DIAGNOSIS — F329 Major depressive disorder, single episode, unspecified: Secondary | ICD-10-CM

## 2015-06-03 DIAGNOSIS — F32A Depression, unspecified: Secondary | ICD-10-CM

## 2015-06-03 DIAGNOSIS — I10 Essential (primary) hypertension: Secondary | ICD-10-CM

## 2015-06-03 LAB — COMPREHENSIVE METABOLIC PANEL
ALK PHOS: 53 U/L (ref 40–115)
ALT: 20 U/L (ref 9–46)
AST: 22 U/L (ref 10–35)
Albumin: 4.3 g/dL (ref 3.6–5.1)
BILIRUBIN TOTAL: 0.5 mg/dL (ref 0.2–1.2)
BUN: 20 mg/dL (ref 7–25)
CO2: 25 mmol/L (ref 20–31)
Calcium: 9.9 mg/dL (ref 8.6–10.3)
Chloride: 100 mmol/L (ref 98–110)
Creat: 1.04 mg/dL (ref 0.70–1.33)
GLUCOSE: 131 mg/dL — AB (ref 65–99)
POTASSIUM: 4.5 mmol/L (ref 3.5–5.3)
Sodium: 138 mmol/L (ref 135–146)
Total Protein: 7.4 g/dL (ref 6.1–8.1)

## 2015-06-03 MED ORDER — FENTANYL 50 MCG/HR TD PT72: 50.0000 ug | MEDICATED_PATCH | TRANSDERMAL | Status: DC

## 2015-06-03 NOTE — Assessment & Plan Note (Signed)
Good control on current medications.  Check labs

## 2015-06-03 NOTE — Progress Notes (Signed)
   Subjective:    Patient ID: Andre Sherman, male    DOB: [birth year], 60 y.o.   MRN: [id number]  HPI   Depression Feeling better than last visit.  Still concerned about his 2 sons with mental illness.   No suicidal ideation.  He is sleeping OK.  Appetite is better.  Bored at home but not much luck finding work.   Takiing elavil regularly.    Leg Pain Stable.  Using fentanyl as directed. Gabapentin every day.  Ran out once and his pain was worse Always has pain but does not matter much if erect or sitting.  No redness or new edema  HYPERTENSION Disease Monitoring Home BP Monitoring (Severity) ususually in 130s/80s Symptoms - Chest pain- no    Dyspnea- no Medications(Modifying factors) Compliance-  Just took before came to office. Lightheadedness-  no  Edema- no Timing - continuous  Duration - years ROS - See HPIReview of Systems  Chief Complaint noted Review of Symptoms - see HPI PMH - Smoking status noted.   Vital Signs reviewed     Objective:   Physical Exam  Alert nad Left leg - well healed large scar on calf.  Minimal edema Psych:  Cognition and judgment appear intact. Alert, communicative  and cooperative with normal attention span and concentration. No apparent delusions, illusions, hallucinations      Assessment & Plan:

## 2015-06-03 NOTE — Assessment & Plan Note (Signed)
Stable.  Continue current medications.

## 2015-06-03 NOTE — Assessment & Plan Note (Signed)
See after visit summary. 

## 2015-06-03 NOTE — Patient Instructions (Addendum)
Good to see you today!  Thanks for coming in.  We will decrease your fentanyl patch to 25 mcg in 3 months  Pick a quit date (your birthday) find a substitute and on your quit date -  Get rid of all cigs  Get rid of all ashtrays  Tell every one you are stopping  Call and leave a message if Erlene Quan can not get an appointment  I will call you if your lab tests are not normal.  Otherwise we will discuss them at your next visit.  Come back in 3 months

## 2015-06-03 NOTE — Assessment & Plan Note (Signed)
Stable.  We discussed plans for weaning down patch.  May try combination of 25 mcg patch and as needed oral medication as bridge to as needed oral medications

## 2015-06-04 ENCOUNTER — Encounter: Payer: Self-pay | Admitting: Family Medicine

## 2015-06-04 NOTE — Addendum Note (Signed)
Addended by: Talbert Cage L on: 06/04/2015 04:12 PM   Modules accepted: Miquel Dunn

## 2015-06-17 ENCOUNTER — Ambulatory Visit: Payer: Medicare Other | Admitting: Family Medicine

## 2015-07-05 ENCOUNTER — Other Ambulatory Visit: Payer: Self-pay | Admitting: Family Medicine

## 2015-07-31 ENCOUNTER — Encounter (HOSPITAL_COMMUNITY): Payer: Self-pay

## 2015-07-31 ENCOUNTER — Ambulatory Visit (HOSPITAL_COMMUNITY)
Admission: EM | Admit: 2015-07-31 | Discharge: 2015-07-31 | Disposition: A | Discharge status: Home / Self Care | Payer: Medicare Other | Attending: Emergency Medicine | Admitting: Emergency Medicine

## 2015-07-31 ENCOUNTER — Ambulatory Visit (HOSPITAL_COMMUNITY): Payer: Medicare Other

## 2015-07-31 DIAGNOSIS — S2242XA Multiple fractures of ribs, left side, initial encounter for closed fracture: Secondary | ICD-10-CM | POA: Diagnosis not present

## 2015-07-31 DIAGNOSIS — Z7982 Long term (current) use of aspirin: Secondary | ICD-10-CM | POA: Diagnosis not present

## 2015-07-31 DIAGNOSIS — R0781 Pleurodynia: Secondary | ICD-10-CM | POA: Diagnosis present

## 2015-07-31 DIAGNOSIS — I10 Essential (primary) hypertension: Secondary | ICD-10-CM | POA: Diagnosis not present

## 2015-07-31 DIAGNOSIS — S2232XA Fracture of one rib, left side, initial encounter for closed fracture: Secondary | ICD-10-CM

## 2015-07-31 DIAGNOSIS — W1789XA Other fall from one level to another, initial encounter: Secondary | ICD-10-CM | POA: Diagnosis not present

## 2015-07-31 DIAGNOSIS — F17210 Nicotine dependence, cigarettes, uncomplicated: Secondary | ICD-10-CM | POA: Insufficient documentation

## 2015-07-31 DIAGNOSIS — Z79899 Other long term (current) drug therapy: Secondary | ICD-10-CM | POA: Insufficient documentation

## 2015-07-31 DIAGNOSIS — Z880 Allergy status to penicillin: Secondary | ICD-10-CM | POA: Diagnosis not present

## 2015-07-31 NOTE — ED Notes (Signed)
Patient states he may have cracked a rib he is experiencing pain in that area x3 days, no medication has been taken for pain and he would like to be checked, pt states pain gets intense when he coughs No acute distress
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
## 2015-07-31 NOTE — Discharge Instructions (Signed)
You have several cracked ribs on the left. Continue to use your fentanyl patch as prescribed. Apply ice at least 3 times a day to help with the pain and bruising. If the pain is not improving in the next week, please follow-up with your primary care doctor.

## 2015-08-08 ENCOUNTER — Encounter (HOSPITAL_COMMUNITY): Payer: Self-pay | Admitting: Emergency Medicine

## 2015-08-08 ENCOUNTER — Emergency Department (HOSPITAL_COMMUNITY)
Admission: EM | Admit: 2015-08-08 | Discharge: 2015-08-08 | Disposition: A | Discharge status: Home / Self Care | Payer: Medicare Other | Attending: Emergency Medicine | Admitting: Emergency Medicine

## 2015-08-08 ENCOUNTER — Emergency Department (HOSPITAL_COMMUNITY): Payer: Medicare Other

## 2015-08-08 DIAGNOSIS — S2232XS Fracture of one rib, left side, sequela: Secondary | ICD-10-CM

## 2015-08-08 DIAGNOSIS — S2242XA Multiple fractures of ribs, left side, initial encounter for closed fracture: Secondary | ICD-10-CM | POA: Diagnosis not present

## 2015-08-08 DIAGNOSIS — Z7982 Long term (current) use of aspirin: Secondary | ICD-10-CM | POA: Insufficient documentation

## 2015-08-08 DIAGNOSIS — W19XXXS Unspecified fall, sequela: Secondary | ICD-10-CM | POA: Insufficient documentation

## 2015-08-08 DIAGNOSIS — S299XXS Unspecified injury of thorax, sequela: Secondary | ICD-10-CM | POA: Diagnosis present

## 2015-08-08 DIAGNOSIS — Z79899 Other long term (current) drug therapy: Secondary | ICD-10-CM | POA: Diagnosis not present

## 2015-08-08 DIAGNOSIS — F17210 Nicotine dependence, cigarettes, uncomplicated: Secondary | ICD-10-CM | POA: Diagnosis not present

## 2015-08-08 DIAGNOSIS — R0781 Pleurodynia: Secondary | ICD-10-CM | POA: Diagnosis not present

## 2015-08-08 DIAGNOSIS — T148 Other injury of unspecified body region: Secondary | ICD-10-CM | POA: Diagnosis not present

## 2015-08-08 DIAGNOSIS — I10 Essential (primary) hypertension: Secondary | ICD-10-CM | POA: Insufficient documentation

## 2015-08-08 DIAGNOSIS — S2242XS Multiple fractures of ribs, left side, sequela: Secondary | ICD-10-CM | POA: Insufficient documentation

## 2015-08-08 DIAGNOSIS — W19XXXA Unspecified fall, initial encounter: Secondary | ICD-10-CM

## 2015-08-08 MED ORDER — OXYCODONE-ACETAMINOPHEN 5-325 MG PO TABS
2.0000 | ORAL_TABLET | Freq: Once | ORAL | Status: AC
  Administered 2015-08-08: 2 via ORAL
  Filled 2015-08-08: qty 2

## 2015-08-08 NOTE — Discharge Instructions (Signed)
You have been seen today for rib pain. Your imaging showed some changes in the position of your broken ribs, but no problems with your lungs or other organs were noted. Use the incentive spirometer, setting a goal of at least 1000 mL. Use this to assure proper lung expansion and help ward off illnesses such as pneumonia. Follow up with PCP as soon as possible for chronic management of this issue. Return to ED should symptoms worsen.
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
## 2015-08-08 NOTE — ED Notes (Signed)
Brought in by EMS from home with c/o left rib cage pain.  Per EMS, pt reported that he fell off his truck last week (Friday) and landed on his left side---- has had pain to left rib cage area since his fall.  Tonight, pt has had a cough and pain "just went off to the roof".  Pt also c/o increased pain on breathing.

## 2015-08-08 NOTE — ED Notes (Signed)
Bed: [id number] Expected date:  Expected time:  Means of arrival:  Comments: EMS 60yo F rib pain / hx of rib fx

## 2015-09-06 ENCOUNTER — Other Ambulatory Visit: Payer: Self-pay | Admitting: Family Medicine

## 2015-09-09 ENCOUNTER — Encounter: Payer: Self-pay | Admitting: Family Medicine

## 2015-09-09 ENCOUNTER — Ambulatory Visit (INDEPENDENT_AMBULATORY_CARE_PROVIDER_SITE_OTHER): Payer: Medicare Other | Admitting: Family Medicine

## 2015-09-09 VITALS — BP 132/90 | HR 95 | Ht 70.0 in | Wt 237.0 lb

## 2015-09-09 DIAGNOSIS — Z7189 Other specified counseling: Secondary | ICD-10-CM

## 2015-09-09 DIAGNOSIS — I10 Essential (primary) hypertension: Secondary | ICD-10-CM

## 2015-09-09 DIAGNOSIS — F17200 Nicotine dependence, unspecified, uncomplicated: Secondary | ICD-10-CM

## 2015-09-09 DIAGNOSIS — S86902S Unspecified injury of unspecified muscle(s) and tendon(s) at lower leg level, left leg, sequela: Secondary | ICD-10-CM

## 2015-09-09 DIAGNOSIS — G8929 Other chronic pain: Secondary | ICD-10-CM

## 2015-09-09 MED ORDER — FENTANYL 50 MCG/HR TD PT72: 50.0000 ug | MEDICATED_PATCH | TRANSDERMAL | Status: DC

## 2015-09-09 NOTE — Progress Notes (Signed)
Subjective  Patient is presenting with the following illnesses  LEG PAIN CHRONIC Continues unchanged.  Using his patch every 3 days.  Had extra pain in chest with recent rib fracture but did not get any new analagesics.  No new soft tissue swelling or redness of leg   TOBACCO ABUSE Down to about 10 cigs per day.  Understands this is a good time to quit with recent rib fracture and pain from this.  Does want to try.  Has increased cough with fractures no fever   HYPERTENSION Disease Monitoring Home BP Monitoring (Severity) not checking Symptoms - Chest pain- except for rib fracture   Dyspnea- no Medications(Modifying factors) Compliance-  daily. Lightheadedness-  no  Edema- no Timing - continuous  Duration - years ROS - See HPI  PMH Lab Review   POTASSIUM  Date Value Ref Range Status  06/03/2015 4.5 3.5 - 5.3 mmol/L Final   SODIUM  Date Value Ref Range Status  06/03/2015 138 135 - 146 mmol/L Final   CREAT  Date Value Ref Range Status  06/03/2015 1.04 0.70 - 1.33 mg/dL Final   CREATININE, SER  Date Value Ref Range Status  01/06/2011 0.89 0.50 - 1.35 mg/dL Final     Chief Complaint noted Review of Symptoms - see HPI PMH - Smoking status noted.     Objective Vital Signs reviewed Walks with slight limp  Assessments/Plans  See Encounter view if individual problem A/Ps not visible See after visit summary for details of patient instuctions

## 2015-09-09 NOTE — Assessment & Plan Note (Signed)
BP Readings from Last 3 Encounters:  09/09/15 132/90  08/08/15 128/81  07/31/15 145/87   At goal Continue medications

## 2015-09-09 NOTE — Patient Instructions (Signed)
Good to see you  Come back in 2-3 months for a full visit  before you need more refills  It would be great time to stop smoking - keep cutting down
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
## 2015-09-09 NOTE — Assessment & Plan Note (Signed)
No real improvement

## 2015-10-22 ENCOUNTER — Encounter (HOSPITAL_COMMUNITY): Payer: Self-pay | Admitting: Emergency Medicine

## 2015-10-22 ENCOUNTER — Ambulatory Visit (HOSPITAL_COMMUNITY)
Admission: EM | Admit: 2015-10-22 | Discharge: 2015-10-22 | Disposition: A | Discharge status: Home / Self Care | Payer: Medicare Other | Attending: Emergency Medicine | Admitting: Emergency Medicine

## 2015-10-22 ENCOUNTER — Ambulatory Visit (INDEPENDENT_AMBULATORY_CARE_PROVIDER_SITE_OTHER): Payer: Medicare Other

## 2015-10-22 DIAGNOSIS — Z79899 Other long term (current) drug therapy: Secondary | ICD-10-CM | POA: Insufficient documentation

## 2015-10-22 DIAGNOSIS — I10 Essential (primary) hypertension: Secondary | ICD-10-CM | POA: Insufficient documentation

## 2015-10-22 DIAGNOSIS — Z23 Encounter for immunization: Secondary | ICD-10-CM | POA: Diagnosis not present

## 2015-10-22 DIAGNOSIS — Z880 Allergy status to penicillin: Secondary | ICD-10-CM | POA: Insufficient documentation

## 2015-10-22 DIAGNOSIS — Z7982 Long term (current) use of aspirin: Secondary | ICD-10-CM | POA: Diagnosis not present

## 2015-10-22 DIAGNOSIS — F17210 Nicotine dependence, cigarettes, uncomplicated: Secondary | ICD-10-CM | POA: Diagnosis not present

## 2015-10-22 DIAGNOSIS — S99922A Unspecified injury of left foot, initial encounter: Secondary | ICD-10-CM | POA: Diagnosis not present

## 2015-10-22 DIAGNOSIS — S91332A Puncture wound without foreign body, left foot, initial encounter: Secondary | ICD-10-CM | POA: Diagnosis not present

## 2015-10-22 DIAGNOSIS — M79606 Pain in leg, unspecified: Secondary | ICD-10-CM | POA: Insufficient documentation

## 2015-10-22 DIAGNOSIS — M79672 Pain in left foot: Secondary | ICD-10-CM

## 2015-10-22 DIAGNOSIS — G629 Polyneuropathy, unspecified: Secondary | ICD-10-CM | POA: Insufficient documentation

## 2015-10-22 DIAGNOSIS — W450XXA Nail entering through skin, initial encounter: Secondary | ICD-10-CM | POA: Insufficient documentation

## 2015-10-22 DIAGNOSIS — G8929 Other chronic pain: Secondary | ICD-10-CM | POA: Insufficient documentation

## 2015-10-22 MED ORDER — IBUPROFEN 800 MG PO TABS: 800.0000 mg | ORAL_TABLET | Freq: Three times a day (TID) | ORAL | 0 refills | Status: DC

## 2015-10-22 MED ORDER — LEVOFLOXACIN 750 MG PO TABS: 750.0000 mg | ORAL_TABLET | Freq: Every day | ORAL | 0 refills | Status: DC
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
## 2015-10-22 NOTE — ED Triage Notes (Signed)
Stepped  On a  Nail     4  Days  Ago      The  Next  Day   Was  Getting  Out  Of  His truck  And   inj  l   Heel   Pt  Has   Pain in  l heel

## 2015-10-22 NOTE — Discharge Instructions (Signed)
I'm giving you information on plantar fasciitis as well so that you have a better idea of what it is. Continue the wound care soak with Hibiclens, dressing, bacitracin, clean sock. Follow-up with podiatry in a week to make sure this is healing properly. Go to the ER for the signs and symptoms we discussed

## 2015-10-25 LAB — AEROBIC CULTURE W GRAM STAIN (SUPERFICIAL SPECIMEN)

## 2015-10-25 LAB — AEROBIC CULTURE  (SUPERFICIAL SPECIMEN)
GRAM STAIN: NONE SEEN
SPECIAL REQUESTS: NORMAL

## 2015-10-26 ENCOUNTER — Telehealth (HOSPITAL_COMMUNITY): Payer: Self-pay | Admitting: Emergency Medicine

## 2015-10-26 NOTE — Telephone Encounter (Addendum)
LM on pt's VM [phone] Need to see how pt is doing and to give lab results from recent visit on 8/24 Also let pt know labs can be obtained from [place] documentation to [facility]

## 2015-10-26 NOTE — Telephone Encounter (Signed)
-----   Message from Sherlene Shams, MD sent at 10/25/2015  5:26 PM EDT ----- Please let patient know that left foot wound culture was positive for Staph, sensitive to levaquin.  Rx levaquin given at [facility] visit 10/22/15.   Recheck or followup PCP/Marshall Chambliss if increasing redness/swelling/drainage/pain or new fever >100.5, or if not starting to improve in a few days.  LM

## 2015-10-27 ENCOUNTER — Ambulatory Visit (INDEPENDENT_AMBULATORY_CARE_PROVIDER_SITE_OTHER)
Admission: EM | Admit: 2015-10-27 | Discharge: 2015-10-27 | Disposition: A | Discharge status: Nursing Home (Includes ICF) | Payer: Medicare Other | Source: Home / Self Care

## 2015-10-27 ENCOUNTER — Encounter (HOSPITAL_COMMUNITY): Payer: Self-pay

## 2015-10-27 ENCOUNTER — Encounter (HOSPITAL_COMMUNITY): Payer: Self-pay | Admitting: Emergency Medicine

## 2015-10-27 ENCOUNTER — Inpatient Hospital Stay (HOSPITAL_COMMUNITY)
Admission: EM | Admit: 2015-10-27 | Discharge: 2015-10-31 | DRG: 580 | Disposition: A | Discharge status: Home / Self Care | Payer: Medicare Other | Attending: Family Medicine | Admitting: Family Medicine

## 2015-10-27 ENCOUNTER — Emergency Department (HOSPITAL_COMMUNITY): Payer: Medicare Other

## 2015-10-27 DIAGNOSIS — T148XXA Other injury of unspecified body region, initial encounter: Secondary | ICD-10-CM

## 2015-10-27 DIAGNOSIS — G8921 Chronic pain due to trauma: Secondary | ICD-10-CM | POA: Diagnosis present

## 2015-10-27 DIAGNOSIS — L0390 Cellulitis, unspecified: Secondary | ICD-10-CM | POA: Diagnosis present

## 2015-10-27 DIAGNOSIS — S90852A Superficial foreign body, left foot, initial encounter: Secondary | ICD-10-CM

## 2015-10-27 DIAGNOSIS — Z7982 Long term (current) use of aspirin: Secondary | ICD-10-CM | POA: Diagnosis not present

## 2015-10-27 DIAGNOSIS — N179 Acute kidney failure, unspecified: Secondary | ICD-10-CM

## 2015-10-27 DIAGNOSIS — L089 Local infection of the skin and subcutaneous tissue, unspecified: Secondary | ICD-10-CM | POA: Diagnosis not present

## 2015-10-27 DIAGNOSIS — L03116 Cellulitis of left lower limb: Secondary | ICD-10-CM

## 2015-10-27 DIAGNOSIS — F17210 Nicotine dependence, cigarettes, uncomplicated: Secondary | ICD-10-CM | POA: Diagnosis present

## 2015-10-27 DIAGNOSIS — F329 Major depressive disorder, single episode, unspecified: Secondary | ICD-10-CM | POA: Diagnosis present

## 2015-10-27 DIAGNOSIS — B999 Unspecified infectious disease: Secondary | ICD-10-CM

## 2015-10-27 DIAGNOSIS — S91339A Puncture wound without foreign body, unspecified foot, initial encounter: Secondary | ICD-10-CM | POA: Diagnosis present

## 2015-10-27 DIAGNOSIS — M869 Osteomyelitis, unspecified: Secondary | ICD-10-CM | POA: Diagnosis not present

## 2015-10-27 DIAGNOSIS — M86172 Other acute osteomyelitis, left ankle and foot: Secondary | ICD-10-CM

## 2015-10-27 DIAGNOSIS — R739 Hyperglycemia, unspecified: Secondary | ICD-10-CM | POA: Diagnosis not present

## 2015-10-27 DIAGNOSIS — M79672 Pain in left foot: Secondary | ICD-10-CM | POA: Diagnosis present

## 2015-10-27 DIAGNOSIS — Z23 Encounter for immunization: Secondary | ICD-10-CM | POA: Diagnosis not present

## 2015-10-27 DIAGNOSIS — M7989 Other specified soft tissue disorders: Secondary | ICD-10-CM | POA: Diagnosis not present

## 2015-10-27 DIAGNOSIS — I10 Essential (primary) hypertension: Secondary | ICD-10-CM | POA: Diagnosis not present

## 2015-10-27 DIAGNOSIS — S91302A Unspecified open wound, left foot, initial encounter: Secondary | ICD-10-CM | POA: Diagnosis not present

## 2015-10-27 DIAGNOSIS — M795 Residual foreign body in soft tissue: Secondary | ICD-10-CM

## 2015-10-27 DIAGNOSIS — M79605 Pain in left leg: Secondary | ICD-10-CM | POA: Diagnosis not present

## 2015-10-27 HISTORY — DX: Major depressive disorder, single episode, unspecified: F32.9

## 2015-10-27 HISTORY — DX: Depression, unspecified: F32.A

## 2015-10-27 LAB — COMPREHENSIVE METABOLIC PANEL
ALT: 19 U/L (ref 17–63)
AST: 34 U/L (ref 15–41)
Albumin: 3.5 g/dL (ref 3.5–5.0)
Alkaline Phosphatase: 62 U/L (ref 38–126)
Anion gap: 7 (ref 5–15)
BUN: 41 mg/dL — ABNORMAL HIGH (ref 6–20)
CO2: 28 mmol/L (ref 22–32)
Calcium: 9.7 mg/dL (ref 8.9–10.3)
Chloride: 99 mmol/L — ABNORMAL LOW (ref 101–111)
Creatinine, Ser: 1.56 mg/dL — ABNORMAL HIGH (ref 0.61–1.24)
GFR calc Af Amer: 54 mL/min — ABNORMAL LOW (ref >60)
GFR calc non Af Amer: 47 mL/min — ABNORMAL LOW (ref >60)
Glucose, Bld: 97 mg/dL (ref 65–99)
Potassium: 5.3 mmol/L — ABNORMAL HIGH (ref 3.5–5.1)
Sodium: 134 mmol/L — ABNORMAL LOW (ref 135–145)
Total Bilirubin: 0.6 mg/dL (ref 0.3–1.2)
Total Protein: 7.4 g/dL (ref 6.5–8.1)

## 2015-10-27 LAB — CBC WITH DIFFERENTIAL/PLATELET
Basophils Absolute: 0.1 10*3/uL (ref 0.0–0.1)
Basophils Relative: 1 %
Eosinophils Absolute: 1.3 10*3/uL — ABNORMAL HIGH (ref 0.0–0.7)
Eosinophils Relative: 12 %
HCT: 41.9 % (ref 39.0–52.0)
Hemoglobin: 13.6 g/dL (ref 13.0–17.0)
Lymphocytes Relative: 28 %
Lymphs Abs: 3 10*3/uL (ref 0.7–4.0)
MCH: 31.1 pg (ref 26.0–34.0)
MCHC: 32.5 g/dL (ref 30.0–36.0)
MCV: 95.7 fL (ref 78.0–100.0)
Monocytes Absolute: 0.9 10*3/uL (ref 0.1–1.0)
Monocytes Relative: 8 %
Neutro Abs: 5.6 10*3/uL (ref 1.7–7.7)
Neutrophils Relative %: 51 %
Platelets: 267 10*3/uL (ref 150–400)
RBC: 4.38 MIL/uL (ref 4.22–5.81)
RDW: 13.4 % (ref 11.5–15.5)
WBC: 10.8 10*3/uL — ABNORMAL HIGH (ref 4.0–10.5)

## 2015-10-27 LAB — I-STAT CG4 LACTIC ACID, ED: Lactic Acid, Venous: 1.23 mmol/L (ref 0.5–1.9)

## 2015-10-27 MED ORDER — CLINDAMYCIN HCL 300 MG PO CAPS: 300.0000 mg | ORAL_CAPSULE | Freq: Four times a day (QID) | ORAL | Status: DC

## 2015-10-27 MED ORDER — ACETAMINOPHEN 650 MG RE SUPP: 650.0000 mg | Freq: Four times a day (QID) | RECTAL | Status: DC | PRN

## 2015-10-27 MED ORDER — FENTANYL 50 MCG/HR TD PT72
50.0000 ug | MEDICATED_PATCH | TRANSDERMAL | Status: DC
  Administered 2015-10-28 – 2015-10-31 (×2): 50 ug via TRANSDERMAL
  Filled 2015-10-27 (×2): qty 1

## 2015-10-27 MED ORDER — CLINDAMYCIN PHOSPHATE 600 MG/50ML IV SOLN
600.0000 mg | Freq: Once | INTRAVENOUS | Status: AC
  Administered 2015-10-27: 600 mg via INTRAVENOUS
  Filled 2015-10-27: qty 50

## 2015-10-27 MED ORDER — METOPROLOL TARTRATE 50 MG PO TABS
50.0000 mg | ORAL_TABLET | Freq: Two times a day (BID) | ORAL | Status: DC
  Administered 2015-10-27 – 2015-10-31 (×8): 50 mg via ORAL
  Filled 2015-10-27 (×8): qty 1

## 2015-10-27 MED ORDER — SODIUM CHLORIDE 0.9 % IV SOLN
INTRAVENOUS | Status: AC
  Administered 2015-10-27 – 2015-10-28 (×2): via INTRAVENOUS

## 2015-10-27 MED ORDER — AMITRIPTYLINE HCL 50 MG PO TABS
75.0000 mg | ORAL_TABLET | Freq: Every day | ORAL | Status: DC
  Administered 2015-10-27 – 2015-10-30 (×4): 75 mg via ORAL
  Filled 2015-10-27 (×4): qty 1

## 2015-10-27 MED ORDER — CIPROFLOXACIN IN D5W 400 MG/200ML IV SOLN
400.0000 mg | Freq: Once | INTRAVENOUS | Status: AC
  Administered 2015-10-27: 400 mg via INTRAVENOUS
  Filled 2015-10-27: qty 200

## 2015-10-27 MED ORDER — GABAPENTIN 400 MG PO CAPS
800.0000 mg | ORAL_CAPSULE | Freq: Two times a day (BID) | ORAL | Status: DC
  Administered 2015-10-27 – 2015-10-31 (×8): 800 mg via ORAL
  Filled 2015-10-27 (×8): qty 2

## 2015-10-27 MED ORDER — ACETAMINOPHEN 325 MG PO TABS
650.0000 mg | ORAL_TABLET | Freq: Four times a day (QID) | ORAL | Status: DC | PRN
Start: 2015-10-27 — End: 2015-10-30

## 2015-10-27 MED ORDER — ASPIRIN EC 81 MG PO TBEC
81.0000 mg | DELAYED_RELEASE_TABLET | Freq: Every day | ORAL | Status: DC
  Administered 2015-10-28 – 2015-10-31 (×3): 81 mg via ORAL
  Filled 2015-10-27 (×3): qty 1

## 2015-10-27 MED ORDER — CIPROFLOXACIN HCL 500 MG PO TABS: 500.0000 mg | ORAL_TABLET | Freq: Two times a day (BID) | ORAL | Status: DC

## 2015-10-27 MED ORDER — ENOXAPARIN SODIUM 40 MG/0.4ML ~~LOC~~ SOLN
40.0000 mg | SUBCUTANEOUS | Status: DC
  Administered 2015-10-28 – 2015-10-31 (×3): 40 mg via SUBCUTANEOUS
  Filled 2015-10-27 (×3): qty 0.4

## 2015-10-27 MED ORDER — CIPROFLOXACIN HCL 500 MG PO TABS
500.0000 mg | ORAL_TABLET | Freq: Two times a day (BID) | ORAL | Status: DC
  Administered 2015-10-28 – 2015-10-31 (×7): 500 mg via ORAL
  Filled 2015-10-27 (×7): qty 1

## 2015-10-27 MED ORDER — CLINDAMYCIN HCL 300 MG PO CAPS
300.0000 mg | ORAL_CAPSULE | Freq: Four times a day (QID) | ORAL | Status: DC
  Administered 2015-10-28 – 2015-10-31 (×13): 300 mg via ORAL
  Filled 2015-10-27 (×13): qty 1

## 2015-10-27 NOTE — ED Triage Notes (Signed)
The patient presented to the [facility] for his lab results from a wound culture that was obtained on 10/22/2015. Patient reports increased swelling and pain.

## 2015-10-27 NOTE — ED Notes (Signed)
Patient report called to Jarrett Soho, RN at [facility]

## 2015-10-27 NOTE — ED Notes (Signed)
Unable to take vital signs pt not answering
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
## 2015-10-27 NOTE — ED Notes (Signed)
Patient sent from [facility] due to Left foot wound that has not been healing. Pt has HX of stepping on a nail. Swelling has increased. Sent for r/o Osteomyelitis.
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
## 2015-10-28 ENCOUNTER — Observation Stay (HOSPITAL_COMMUNITY): Payer: Medicare Other

## 2015-10-28 ENCOUNTER — Other Ambulatory Visit (HOSPITAL_COMMUNITY): Payer: Self-pay | Admitting: Family

## 2015-10-28 ENCOUNTER — Observation Stay (HOSPITAL_BASED_OUTPATIENT_CLINIC_OR_DEPARTMENT_OTHER): Payer: Medicare Other

## 2015-10-28 ENCOUNTER — Encounter (HOSPITAL_COMMUNITY): Payer: Self-pay | Admitting: General Practice

## 2015-10-28 DIAGNOSIS — M86172 Other acute osteomyelitis, left ankle and foot: Secondary | ICD-10-CM | POA: Diagnosis not present

## 2015-10-28 DIAGNOSIS — S90852A Superficial foreign body, left foot, initial encounter: Secondary | ICD-10-CM

## 2015-10-28 DIAGNOSIS — L03116 Cellulitis of left lower limb: Secondary | ICD-10-CM | POA: Diagnosis not present

## 2015-10-28 DIAGNOSIS — R609 Edema, unspecified: Secondary | ICD-10-CM

## 2015-10-28 DIAGNOSIS — N179 Acute kidney failure, unspecified: Secondary | ICD-10-CM | POA: Diagnosis not present

## 2015-10-28 DIAGNOSIS — R600 Localized edema: Secondary | ICD-10-CM | POA: Diagnosis not present

## 2015-10-28 DIAGNOSIS — M79672 Pain in left foot: Secondary | ICD-10-CM | POA: Diagnosis not present

## 2015-10-28 DIAGNOSIS — S91332A Puncture wound without foreign body, left foot, initial encounter: Secondary | ICD-10-CM

## 2015-10-28 DIAGNOSIS — E1142 Type 2 diabetes mellitus with diabetic polyneuropathy: Secondary | ICD-10-CM | POA: Diagnosis not present

## 2015-10-28 LAB — CBC
HCT: 39.5 % (ref 39.0–52.0)
Hemoglobin: 12.8 g/dL — ABNORMAL LOW (ref 13.0–17.0)
MCH: 30.8 pg (ref 26.0–34.0)
MCHC: 32.4 g/dL (ref 30.0–36.0)
MCV: 95.2 fL (ref 78.0–100.0)
PLATELETS: 242 10*3/uL (ref 150–400)
RBC: 4.15 MIL/uL — AB (ref 4.22–5.81)
RDW: 13.2 % (ref 11.5–15.5)
WBC: 9.8 10*3/uL (ref 4.0–10.5)

## 2015-10-28 LAB — HEMOGLOBIN A1C
HEMOGLOBIN A1C: 6.1 % — AB (ref 4.8–5.6)
MEAN PLASMA GLUCOSE: 128 mg/dL

## 2015-10-28 LAB — SEDIMENTATION RATE: SED RATE: 26 mm/h — AB (ref 0–16)

## 2015-10-28 LAB — BASIC METABOLIC PANEL
ANION GAP: 9 (ref 5–15)
BUN: 35 mg/dL — ABNORMAL HIGH (ref 6–20)
CO2: 27 mmol/L (ref 22–32)
Calcium: 9.2 mg/dL (ref 8.9–10.3)
Chloride: 98 mmol/L — ABNORMAL LOW (ref 101–111)
Creatinine, Ser: 1.34 mg/dL — ABNORMAL HIGH (ref 0.61–1.24)
GFR, EST NON AFRICAN AMERICAN: 56 mL/min — AB (ref >60)
Glucose, Bld: 118 mg/dL — ABNORMAL HIGH (ref 65–99)
POTASSIUM: 3.7 mmol/L (ref 3.5–5.1)
SODIUM: 134 mmol/L — AB (ref 135–145)

## 2015-10-28 LAB — C-REACTIVE PROTEIN: CRP: 1.6 mg/dL — AB (ref <1.0)

## 2015-10-28 MED ORDER — PNEUMOCOCCAL VAC POLYVALENT 25 MCG/0.5ML IJ INJ
0.5000 mL | INJECTION | INTRAMUSCULAR | Status: AC
  Administered 2015-10-31: 0.5 mL via INTRAMUSCULAR
  Filled 2015-10-28: qty 0.5

## 2015-10-28 MED ORDER — LORATADINE 10 MG PO TABS
10.0000 mg | ORAL_TABLET | Freq: Every day | ORAL | Status: DC | PRN
  Administered 2015-10-28: 10 mg via ORAL
  Filled 2015-10-28: qty 1

## 2015-10-28 MED ORDER — DOCUSATE SODIUM 100 MG PO CAPS: 100.0000 mg | ORAL_CAPSULE | Freq: Every day | ORAL | Status: DC | PRN

## 2015-10-28 MED ORDER — PHENOL 1.4 % MT LIQD
1.0000 | OROMUCOSAL | Status: DC | PRN
  Filled 2015-10-28: qty 177
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
## 2015-10-28 NOTE — Progress Notes (Signed)
*  PRELIMINARY RESULTS* Vascular Ultrasound Left lower extremity venous duplex has been completed.  Preliminary findings: Technically limited due to thick scars on upper and lower leg. Not all veins could be visualized. No obvious DVT noted in visualized veins.  Landry Mellow, RDMS, RVT  10/28/2015, 12:11 PM
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
## 2015-10-28 NOTE — Progress Notes (Signed)
Ortho Consult received.   Spoken with Dr. Sharol Given who is planning to see the patient.   Johnny Bridge, MD
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
## 2015-10-28 NOTE — Consult Note (Addendum)
WOC consult requested for left foot wound.  Please refer to ortho service for further plan of care related to foreign object.   X-ray results indicate:  1. Linear metallic foreign body in plantar soft tissues approximately aligned with the mid second metatarsal bone. 2. Increased swelling of foot and ankle from prior radiographs. Persistent skin defect of the plantar foot in region of metatarsal heads. This complex condition is beyond the [facility] scope of practice. Primary team progress notes indicate that ortho service has been consulted and is their input is pending. Please re-consult if further assistance is needed.  Thank-you,  Julien Girt MSN, [place], [place], [place], [place]

## 2015-10-29 ENCOUNTER — Observation Stay (HOSPITAL_COMMUNITY): Payer: Medicare Other

## 2015-10-29 DIAGNOSIS — Z720 Tobacco use: Secondary | ICD-10-CM | POA: Diagnosis not present

## 2015-10-29 DIAGNOSIS — M795 Residual foreign body in soft tissue: Secondary | ICD-10-CM | POA: Diagnosis not present

## 2015-10-29 DIAGNOSIS — M79672 Pain in left foot: Secondary | ICD-10-CM | POA: Diagnosis not present

## 2015-10-29 DIAGNOSIS — N179 Acute kidney failure, unspecified: Secondary | ICD-10-CM | POA: Diagnosis not present

## 2015-10-29 DIAGNOSIS — L03115 Cellulitis of right lower limb: Secondary | ICD-10-CM | POA: Diagnosis not present

## 2015-10-29 DIAGNOSIS — L03116 Cellulitis of left lower limb: Secondary | ICD-10-CM | POA: Diagnosis not present

## 2015-10-29 LAB — BASIC METABOLIC PANEL
Anion gap: 6 (ref 5–15)
BUN: 20 mg/dL (ref 6–20)
CHLORIDE: 101 mmol/L (ref 101–111)
CO2: 28 mmol/L (ref 22–32)
CREATININE: 1.03 mg/dL (ref 0.61–1.24)
Calcium: 8.9 mg/dL (ref 8.9–10.3)
GFR calc Af Amer: >60 mL/min (ref >60)
GFR calc non Af Amer: >60 mL/min (ref >60)
Glucose, Bld: 97 mg/dL (ref 65–99)
Potassium: 4.5 mmol/L (ref 3.5–5.1)
Sodium: 135 mmol/L (ref 135–145)

## 2015-10-29 MED ORDER — CHLORHEXIDINE GLUCONATE 4 % EX LIQD
60.0000 mL | Freq: Once | CUTANEOUS | Status: AC
  Administered 2015-10-30: 4 via TOPICAL
  Filled 2015-10-29: qty 60

## 2015-10-29 MED ORDER — ALBUTEROL SULFATE (2.5 MG/3ML) 0.083% IN NEBU: 2.5000 mg | INHALATION_SOLUTION | RESPIRATORY_TRACT | Status: DC | PRN

## 2015-10-29 MED ORDER — IPRATROPIUM-ALBUTEROL 0.5-2.5 (3) MG/3ML IN SOLN
3.0000 mL | Freq: Four times a day (QID) | RESPIRATORY_TRACT | Status: DC
  Administered 2015-10-29: 3 mL via RESPIRATORY_TRACT
  Filled 2015-10-29: qty 3

## 2015-10-29 MED ORDER — IPRATROPIUM-ALBUTEROL 0.5-2.5 (3) MG/3ML IN SOLN
3.0000 mL | Freq: Three times a day (TID) | RESPIRATORY_TRACT | Status: DC
  Administered 2015-10-29 – 2015-10-31 (×6): 3 mL via RESPIRATORY_TRACT
  Filled 2015-10-29 (×6): qty 3

## 2015-10-29 MED ORDER — CLINDAMYCIN PHOSPHATE 900 MG/50ML IV SOLN
900.0000 mg | INTRAVENOUS | Status: AC
  Administered 2015-10-30: 900 mg via INTRAVENOUS
  Filled 2015-10-29: qty 50
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
## 2015-10-29 NOTE — Progress Notes (Signed)
Patient ID: Andre Sherman, male   DOB: 1956-01-19, 60 y.o.   MRN: [id number] Patient was moving his foot during the MRI scan. I cannot obtain much information from the MRI scan. I will tentatively place the patient on the surgical schedule for tomorrow afternoon Friday. Anticipate repeat MRI scan today.

## 2015-10-29 NOTE — Care Management Obs Status (Signed)
[place] NOTIFICATION   Patient Details  Name: Andre Sherman MRN: [id number] Date of Birth: [birth year]   Medicare Observation Status Notification Given:  Yes    Marilu Favre, RN 10/29/2015, 2:34 PM

## 2015-10-29 NOTE — Progress Notes (Signed)
MRI of left foot was obtained last night with degrading motion artifact.  Another order was put in for the left foot although we cannot scan patient due to patient stating his toes feel like they are burning while we are scanning. Patient was unable to stop coughing and moving legs while scanning.  All resources were used to obtain a good study and patient was unable to hold still.  Dr. Gavin Potters office was called to notify him and RN is aware.

## 2015-10-30 ENCOUNTER — Observation Stay (HOSPITAL_COMMUNITY): Payer: Medicare Other | Admitting: Certified Registered"

## 2015-10-30 ENCOUNTER — Encounter (HOSPITAL_COMMUNITY)
Admission: EM | Disposition: A | Discharge status: Home / Self Care | Payer: Self-pay | Source: Home / Self Care | Attending: Family Medicine

## 2015-10-30 DIAGNOSIS — L03115 Cellulitis of right lower limb: Secondary | ICD-10-CM | POA: Diagnosis not present

## 2015-10-30 DIAGNOSIS — M79672 Pain in left foot: Secondary | ICD-10-CM | POA: Diagnosis not present

## 2015-10-30 DIAGNOSIS — M869 Osteomyelitis, unspecified: Secondary | ICD-10-CM | POA: Diagnosis not present

## 2015-10-30 DIAGNOSIS — Z720 Tobacco use: Secondary | ICD-10-CM | POA: Diagnosis not present

## 2015-10-30 DIAGNOSIS — M86172 Other acute osteomyelitis, left ankle and foot: Secondary | ICD-10-CM | POA: Diagnosis not present

## 2015-10-30 DIAGNOSIS — E1142 Type 2 diabetes mellitus with diabetic polyneuropathy: Secondary | ICD-10-CM | POA: Diagnosis not present

## 2015-10-30 DIAGNOSIS — L03116 Cellulitis of left lower limb: Secondary | ICD-10-CM | POA: Diagnosis not present

## 2015-10-30 DIAGNOSIS — N179 Acute kidney failure, unspecified: Secondary | ICD-10-CM | POA: Diagnosis not present

## 2015-10-30 DIAGNOSIS — S91332A Puncture wound without foreign body, left foot, initial encounter: Secondary | ICD-10-CM | POA: Diagnosis not present

## 2015-10-30 HISTORY — PX: I & D EXTREMITY: SHX5045

## 2015-10-30 LAB — BASIC METABOLIC PANEL
Anion gap: 7 (ref 5–15)
BUN: 15 mg/dL (ref 6–20)
CALCIUM: 9 mg/dL (ref 8.9–10.3)
CHLORIDE: 102 mmol/L (ref 101–111)
CO2: 28 mmol/L (ref 22–32)
CREATININE: 0.94 mg/dL (ref 0.61–1.24)
GFR calc non Af Amer: >60 mL/min (ref >60)
Glucose, Bld: 106 mg/dL — ABNORMAL HIGH (ref 65–99)
Potassium: 4.7 mmol/L (ref 3.5–5.1)
SODIUM: 137 mmol/L (ref 135–145)

## 2015-10-30 LAB — CBC
HEMATOCRIT: 41 % (ref 39.0–52.0)
HEMOGLOBIN: 13.1 g/dL (ref 13.0–17.0)
MCH: 30.8 pg (ref 26.0–34.0)
MCHC: 32 g/dL (ref 30.0–36.0)
MCV: 96.5 fL (ref 78.0–100.0)
Platelets: 225 10*3/uL (ref 150–400)
RBC: 4.25 MIL/uL (ref 4.22–5.81)
RDW: 13.2 % (ref 11.5–15.5)
WBC: 7.8 10*3/uL (ref 4.0–10.5)

## 2015-10-30 LAB — SURGICAL PCR SCREEN
MRSA, PCR: NEGATIVE
STAPHYLOCOCCUS AUREUS: NEGATIVE

## 2015-10-30 SURGERY — IRRIGATION AND DEBRIDEMENT EXTREMITY
Anesthesia: Regional | Site: Foot | Laterality: Left

## 2015-10-30 MED ORDER — LIDOCAINE 2% (20 MG/ML) 5 ML SYRINGE
INTRAMUSCULAR | Status: AC
  Filled 2015-10-30: qty 5

## 2015-10-30 MED ORDER — SODIUM CHLORIDE 0.9 % IV SOLN: INTRAVENOUS | Status: DC

## 2015-10-30 MED ORDER — MIDAZOLAM HCL 5 MG/5ML IJ SOLN
INTRAMUSCULAR | Status: DC | PRN
  Administered 2015-10-30: 2 mg via INTRAVENOUS

## 2015-10-30 MED ORDER — METOCLOPRAMIDE HCL 5 MG/ML IJ SOLN: 5.0000 mg | Freq: Three times a day (TID) | INTRAMUSCULAR | Status: DC | PRN

## 2015-10-30 MED ORDER — PHENYLEPHRINE 40 MCG/ML (10ML) SYRINGE FOR IV PUSH (FOR BLOOD PRESSURE SUPPORT)
PREFILLED_SYRINGE | INTRAVENOUS | Status: AC
  Filled 2015-10-30: qty 10

## 2015-10-30 MED ORDER — ONDANSETRON HCL 4 MG/2ML IJ SOLN
INTRAMUSCULAR | Status: AC
  Filled 2015-10-30: qty 2

## 2015-10-30 MED ORDER — MIDAZOLAM HCL 2 MG/2ML IJ SOLN
INTRAMUSCULAR | Status: AC
  Filled 2015-10-30: qty 2

## 2015-10-30 MED ORDER — PROPOFOL 10 MG/ML IV BOLUS
INTRAVENOUS | Status: DC | PRN
  Administered 2015-10-30: 200 mg via INTRAVENOUS

## 2015-10-30 MED ORDER — METHOCARBAMOL 1000 MG/10ML IJ SOLN
500.0000 mg | Freq: Four times a day (QID) | INTRAVENOUS | Status: DC | PRN
  Filled 2015-10-30: qty 5

## 2015-10-30 MED ORDER — PHENYLEPHRINE 40 MCG/ML (10ML) SYRINGE FOR IV PUSH (FOR BLOOD PRESSURE SUPPORT)
PREFILLED_SYRINGE | INTRAVENOUS | Status: DC | PRN
Start: 2015-10-30 — End: 2015-10-30
  Administered 2015-10-30 (×2): 40 ug via INTRAVENOUS
  Administered 2015-10-30: 80 ug via INTRAVENOUS

## 2015-10-30 MED ORDER — ACETAMINOPHEN 650 MG RE SUPP: 650.0000 mg | Freq: Four times a day (QID) | RECTAL | Status: DC | PRN

## 2015-10-30 MED ORDER — METHOCARBAMOL 500 MG PO TABS
500.0000 mg | ORAL_TABLET | Freq: Four times a day (QID) | ORAL | Status: DC | PRN
  Administered 2015-10-30: 500 mg via ORAL
  Filled 2015-10-30: qty 1

## 2015-10-30 MED ORDER — LIDOCAINE 2% (20 MG/ML) 5 ML SYRINGE
INTRAMUSCULAR | Status: DC | PRN
  Administered 2015-10-30: 100 mg via INTRAVENOUS

## 2015-10-30 MED ORDER — DEXAMETHASONE SODIUM PHOSPHATE 10 MG/ML IJ SOLN
INTRAMUSCULAR | Status: DC | PRN
  Administered 2015-10-30: 5 mg via INTRAVENOUS

## 2015-10-30 MED ORDER — ACETAMINOPHEN 325 MG PO TABS
650.0000 mg | ORAL_TABLET | Freq: Four times a day (QID) | ORAL | Status: DC | PRN
  Administered 2015-10-30: 650 mg via ORAL
  Filled 2015-10-30: qty 2

## 2015-10-30 MED ORDER — DEXAMETHASONE SODIUM PHOSPHATE 10 MG/ML IJ SOLN
INTRAMUSCULAR | Status: AC
  Filled 2015-10-30: qty 1

## 2015-10-30 MED ORDER — METOCLOPRAMIDE HCL 5 MG PO TABS: 5.0000 mg | ORAL_TABLET | Freq: Three times a day (TID) | ORAL | Status: DC | PRN

## 2015-10-30 MED ORDER — FENTANYL CITRATE (PF) 100 MCG/2ML IJ SOLN
INTRAMUSCULAR | Status: AC
  Filled 2015-10-30: qty 2

## 2015-10-30 MED ORDER — PROPOFOL 10 MG/ML IV BOLUS
INTRAVENOUS | Status: AC
  Filled 2015-10-30: qty 20

## 2015-10-30 MED ORDER — LACTATED RINGERS IV SOLN
INTRAVENOUS | Status: DC | PRN
  Administered 2015-10-30: 13:00:00 via INTRAVENOUS

## 2015-10-30 MED ORDER — ONDANSETRON HCL 4 MG/2ML IJ SOLN: 4.0000 mg | Freq: Four times a day (QID) | INTRAMUSCULAR | Status: DC | PRN

## 2015-10-30 MED ORDER — ONDANSETRON HCL 4 MG PO TABS: 4.0000 mg | ORAL_TABLET | Freq: Four times a day (QID) | ORAL | Status: DC | PRN

## 2015-10-30 MED ORDER — 0.9 % SODIUM CHLORIDE (POUR BTL) OPTIME
TOPICAL | Status: DC | PRN
  Administered 2015-10-30: 1000 mL

## 2015-10-30 MED ORDER — FENTANYL CITRATE (PF) 100 MCG/2ML IJ SOLN
INTRAMUSCULAR | Status: DC | PRN
  Administered 2015-10-30 (×2): 25 ug via INTRAVENOUS

## 2015-10-30 MED ORDER — ONDANSETRON HCL 4 MG/2ML IJ SOLN
INTRAMUSCULAR | Status: DC | PRN
  Administered 2015-10-30: 4 mg via INTRAVENOUS

## 2015-10-30 SURGICAL SUPPLY — 43 items
BLADE SAW SGTL MED 73X18.5 STR (BLADE) IMPLANT
BLADE SURG 10 STRL SS (BLADE) IMPLANT
BLADE SURG 21 STRL SS (BLADE) ×3 IMPLANT
BNDG COHESIVE 4X5 TAN STRL (GAUZE/BANDAGES/DRESSINGS) ×3 IMPLANT
BNDG COHESIVE 6X5 TAN STRL LF (GAUZE/BANDAGES/DRESSINGS) ×3 IMPLANT
BNDG GAUZE ELAST 4 BULKY (GAUZE/BANDAGES/DRESSINGS) ×3 IMPLANT
COVER SURGICAL LIGHT HANDLE (MISCELLANEOUS) ×6 IMPLANT
DRAPE U-SHAPE 47X51 STRL (DRAPES) ×6 IMPLANT
DRSG ADAPTIC 3X8 NADH LF (GAUZE/BANDAGES/DRESSINGS) ×3 IMPLANT
DRSG PAD ABDOMINAL 8X10 ST (GAUZE/BANDAGES/DRESSINGS) ×3 IMPLANT
DURAPREP 26ML APPLICATOR (WOUND CARE) ×3 IMPLANT
ELECT CAUTERY BLADE 6.4 (BLADE) IMPLANT
ELECT REM PT RETURN 9FT ADLT (ELECTROSURGICAL) ×3
ELECTRODE REM PT RTRN 9FT ADLT (ELECTROSURGICAL) ×2 IMPLANT
GAUZE SPONGE 4X4 12PLY STRL (GAUZE/BANDAGES/DRESSINGS) ×3 IMPLANT
GLOVE BIOGEL PI IND STRL 9 (GLOVE) ×2 IMPLANT
GLOVE BIOGEL PI INDICATOR 9 (GLOVE) ×1
GLOVE SURG ORTHO 9.0 STRL STRW (GLOVE) ×3 IMPLANT
GOWN STRL REUS W/ TWL LRG LVL3 (GOWN DISPOSABLE) ×2 IMPLANT
GOWN STRL REUS W/ TWL XL LVL3 (GOWN DISPOSABLE) ×4 IMPLANT
GOWN STRL REUS W/TWL LRG LVL3 (GOWN DISPOSABLE) ×1
GOWN STRL REUS W/TWL XL LVL3 (GOWN DISPOSABLE) ×2
HANDPIECE INTERPULSE COAX TIP (DISPOSABLE)
KIT BASIN OR (CUSTOM PROCEDURE TRAY) ×3 IMPLANT
KIT ROOM TURNOVER OR (KITS) ×3 IMPLANT
MANIFOLD NEPTUNE II (INSTRUMENTS) ×3 IMPLANT
NS IRRIG 1000ML POUR BTL (IV SOLUTION) ×3 IMPLANT
PACK ORTHO EXTREMITY (CUSTOM PROCEDURE TRAY) ×3 IMPLANT
PAD ARMBOARD 7.5X6 YLW CONV (MISCELLANEOUS) ×6 IMPLANT
SET HNDPC FAN SPRY TIP SCT (DISPOSABLE) IMPLANT
SPONGE GAUZE 4X4 12PLY STER LF (GAUZE/BANDAGES/DRESSINGS) ×3 IMPLANT
SPONGE LAP 18X18 X RAY DECT (DISPOSABLE) ×3 IMPLANT
STOCKINETTE IMPERVIOUS 9X36 MD (GAUZE/BANDAGES/DRESSINGS) IMPLANT
STOCKINETTE IMPERVIOUS LG (DRAPES) IMPLANT
SUT ETHILON 2 0 PSLX (SUTURE) ×6 IMPLANT
SWAB COLLECTION DEVICE MRSA (MISCELLANEOUS) ×3 IMPLANT
TOWEL OR 17X24 6PK STRL BLUE (TOWEL DISPOSABLE) ×3 IMPLANT
TOWEL OR 17X26 10 PK STRL BLUE (TOWEL DISPOSABLE) ×3 IMPLANT
TUBE ANAEROBIC SPECIMEN COL (MISCELLANEOUS) IMPLANT
TUBE CONNECTING 12X1/4 (SUCTIONS) ×3 IMPLANT
UNDERPAD 30X30 (UNDERPADS AND DIAPERS) ×3 IMPLANT
WATER STERILE IRR 1000ML POUR (IV SOLUTION) ×3 IMPLANT
YANKAUER SUCT BULB TIP NO VENT (SUCTIONS) ×3 IMPLANT

## 2015-10-30 NOTE — Op Note (Addendum)
10/27/2015 - 10/30/2015  1:56 PM  PATIENT:  Andre Sherman    PRE-OPERATIVE DIAGNOSIS:  Osteomyelitis left foot  POST-OPERATIVE DIAGNOSIS:  Same  PROCEDURE:   AMPUTATION OF SECOND METATARSAL left foot Local tissue rearrangement for wound closure 2 x 7 cm  SURGEON:  [name], MD  PHYSICIAN ASSISTANT:None ANESTHESIA:   General  PREOPERATIVE INDICATIONS:  Andre Sherman is a  60 y.o. male with a diagnosis of Osteomyelitis left foot who failed conservative measures and elected for surgical management.    The risks benefits and alternatives were discussed with the patient preoperatively including but not limited to the risks of infection, bleeding, nerve injury, cardiopulmonary complications, the need for revision surgery, among others, and the patient was willing to proceed.  OPERATIVE IMPLANTS: None  OPERATIVE FINDINGS: Patient's second metatarsal head was exposed to the open wound.  OPERATIVE PROCEDURE: Patient was brought to operating room and underwent a general anesthetic. After adequate levels anesthesia obtained patient's left lower extremity was prepped using DuraPrep draped into a sterile field. A timeout was called. A longitudinal elliptical incision was made around the ulcerative tissue the ellipsed out area was 2 cm x 7 cm. This was carried down to the bone which was exposed through the wound. The metatarsal head and metatarsal shaft were resected in 1 block of tissue. The wound was irrigated with normal saline hemostasis was obtained. Local tissue rearrangement was used to perform wound closure on the wound 2 by 7 cm. The skin closed well without tension on the skin. A sterile compressive dressing was applied patient was extubated taken the PACU in stable condition incision closed with 2-0 nylon.  Patient may discharge to home at this time when he is safe with physical therapy strict nonweightbearing on the left lower extremity.

## 2015-10-30 NOTE — Transfer of Care (Signed)
Immediate Anesthesia Transfer of Care Note  Patient: Andre Sherman  Procedure(s) Performed: Procedure(s): IRRIGATION AND DEBRIDEMENT LEFT FOOT WITH AMPUTATION OF SECOND METATARSAL (Left)  Patient Location: PACU  Anesthesia Type:General  Level of Consciousness:  sedated, patient cooperative and responds to stimulation  Airway & Oxygen Therapy:Patient Spontanous Breathing and Patient connected to face mask oxgen  Post-op Assessment:  Report given to PACU RN and Post -op Vital signs reviewed and stable  Post vital signs:  Reviewed and stable  Last Vitals:  Vitals:   10/29/15 1959 10/30/15 0522  BP: 115/78 130/80  Pulse: 69 69  Resp: 19 19  Temp: 36.7 C 123456 C    Complications: No apparent anesthesia complications
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
## 2015-10-30 NOTE — Progress Notes (Signed)
Orthopedic Tech Progress Note Patient Details:  Andre Sherman 1956-02-15 [id number]  Ortho Devices Type of Ortho Device: Postop shoe/boot Ortho Device/Splint Interventions: Application   [place] Pink 10/30/2015, 4:30 PM

## 2015-10-30 NOTE — Interval H&P Note (Signed)
History and Physical Interval Note:  10/30/2015 6:39 AM  Andre Sherman  has presented today for surgery, with the diagnosis of Osteomyelitis left foot  The various methods of treatment have been discussed with the patient and family. After consideration of risks, benefits and other options for treatment, the patient has consented to  Procedure(s): IRRIGATION AND DEBRIDEMENT LEFT FOOT (Left) POSSIBLE AMPUTATION LEFT SECOND RAY (Left) as a surgical intervention .  The patient's history has been reviewed, patient examined, no change in status, stable for surgery.  I have reviewed the patient's chart and labs.  Questions were answered to the patient's satisfaction.     [name]

## 2015-10-30 NOTE — Anesthesia Procedure Notes (Signed)
Procedure Name: LMA Insertion Date/Time: 10/30/2015 1:43 PM Performed by: Freddie Breech Pre-anesthesia Checklist: Patient identified, Emergency Drugs available, Suction available and Patient being monitored Patient Re-evaluated:Patient Re-evaluated prior to inductionOxygen Delivery Method: Circle System Utilized Preoxygenation: Pre-oxygenation with 100% oxygen Intubation Type: IV induction Ventilation: Mask ventilation without difficulty LMA: LMA inserted LMA Size: 5.0 Number of attempts: 1 Airway Equipment and Method: Bite block Placement Confirmation: positive ETCO2 and breath sounds checked- equal and bilateral Tube secured with: Tape Dental Injury: Teeth and Oropharynx as per pre-operative assessment

## 2015-10-30 NOTE — Care Management Note (Signed)
Case Management Note  Patient Details  Name: Andre Sherman MRN: [id number] Date of Birth: 10-06-1955  Subjective/Objective:                    Action/Plan:  Consult for HHPT/OT and DME . Will await PT/OT evals .   Will need MD orders for HHPT if recommended and face to face  Expected Discharge Date:                  Expected Discharge Plan:     In-House Referral:     Discharge planning Services  CM Consult  Post Acute Care Choice:  Home Health, Durable Medical Equipment Choice offered to:     DME Arranged:    DME Agency:     HH Arranged:    [place] Agency:     Status of Service:  In process, will continue to follow  If discussed at Long Length of Stay Meetings, dates discussed:    Additional Comments:  Marilu Favre, RN 10/30/2015, 2:56 PM

## 2015-10-30 NOTE — Anesthesia Preprocedure Evaluation (Signed)
Anesthesia Evaluation  Patient identified by MRN, date of birth, ID band Patient awake    Reviewed: Allergy & Precautions, H&P , NPO status , Patient's Chart, lab work & pertinent test results  History of Anesthesia Complications Negative for: history of anesthetic complications  Airway Mallampati: II  TM Distance: >3 FB Neck ROM: full    Dental no notable dental hx.    Pulmonary Current Smoker,    Pulmonary exam normal breath sounds clear to auscultation       Cardiovascular hypertension, Normal cardiovascular exam Rhythm:regular Rate:Normal     Neuro/Psych PSYCHIATRIC DISORDERS Depression negative neurological ROS     GI/Hepatic negative GI ROS, (+) Hepatitis -  Endo/Other  negative endocrine ROS  Renal/GU Renal disease     Musculoskeletal   Abdominal   Peds  Hematology negative hematology ROS (+)   Anesthesia Other Findings   Reproductive/Obstetrics negative OB ROS                             Anesthesia Physical Anesthesia Plan  ASA: III  Anesthesia Plan: Regional and General   Post-op Pain Management: GA combined w/ Regional for post-op pain   Induction: Intravenous  Airway Management Planned: LMA  Additional Equipment:   Intra-op Plan:   Post-operative Plan:   Informed Consent: I have reviewed the patients History and Physical, chart, labs and discussed the procedure including the risks, benefits and alternatives for the proposed anesthesia with the patient or authorized representative who has indicated his/her understanding and acceptance.   Dental Advisory Given  Plan Discussed with: Anesthesiologist, CRNA and Surgeon  Anesthesia Plan Comments:         Anesthesia Quick Evaluation

## 2015-10-30 NOTE — Anesthesia Postprocedure Evaluation (Signed)
Anesthesia Post Note  Patient: Andre Sherman  Procedure(s) Performed: Procedure(s) (LRB): IRRIGATION AND DEBRIDEMENT LEFT FOOT WITH AMPUTATION OF SECOND METATARSAL (Left)  Patient location during evaluation: PACU Anesthesia Type: General Level of consciousness: awake and alert Pain management: pain level controlled Vital Signs Assessment: post-procedure vital signs reviewed and stable Respiratory status: spontaneous breathing, nonlabored ventilation, respiratory function stable and patient connected to nasal cannula oxygen Cardiovascular status: blood pressure returned to baseline and stable Postop Assessment: no signs of nausea or vomiting Anesthetic complications: no    Last Vitals:  Vitals:   10/30/15 1426 10/30/15 1444  BP: 119/86 118/81  Pulse: 65 64  Resp: 10 18  Temp:  36.4 C    Last Pain:  Vitals:   10/30/15 1444  TempSrc: Oral  PainSc:                  [name]

## 2015-10-30 NOTE — H&P (View-Only) (Signed)
Patient ID: Andre Sherman, male   DOB: 04/02/1955, 60 y.o.   MRN: [id number] Patient was moving his foot during the MRI scan. I cannot obtain much information from the MRI scan. I will tentatively place the patient on the surgical schedule for tomorrow afternoon Friday. Anticipate repeat MRI scan today.

## 2015-10-31 DIAGNOSIS — F17210 Nicotine dependence, cigarettes, uncomplicated: Secondary | ICD-10-CM | POA: Diagnosis present

## 2015-10-31 DIAGNOSIS — M795 Residual foreign body in soft tissue: Secondary | ICD-10-CM | POA: Diagnosis not present

## 2015-10-31 DIAGNOSIS — L03115 Cellulitis of right lower limb: Secondary | ICD-10-CM | POA: Diagnosis not present

## 2015-10-31 DIAGNOSIS — F329 Major depressive disorder, single episode, unspecified: Secondary | ICD-10-CM | POA: Diagnosis present

## 2015-10-31 DIAGNOSIS — N179 Acute kidney failure, unspecified: Secondary | ICD-10-CM | POA: Diagnosis present

## 2015-10-31 DIAGNOSIS — M79672 Pain in left foot: Secondary | ICD-10-CM | POA: Diagnosis present

## 2015-10-31 DIAGNOSIS — Z7982 Long term (current) use of aspirin: Secondary | ICD-10-CM | POA: Diagnosis not present

## 2015-10-31 DIAGNOSIS — M869 Osteomyelitis, unspecified: Secondary | ICD-10-CM | POA: Diagnosis present

## 2015-10-31 DIAGNOSIS — Z23 Encounter for immunization: Secondary | ICD-10-CM | POA: Diagnosis not present

## 2015-10-31 DIAGNOSIS — I10 Essential (primary) hypertension: Secondary | ICD-10-CM

## 2015-10-31 DIAGNOSIS — Z720 Tobacco use: Secondary | ICD-10-CM | POA: Diagnosis not present

## 2015-10-31 DIAGNOSIS — R739 Hyperglycemia, unspecified: Secondary | ICD-10-CM | POA: Diagnosis present

## 2015-10-31 DIAGNOSIS — L03116 Cellulitis of left lower limb: Secondary | ICD-10-CM | POA: Diagnosis present

## 2015-10-31 DIAGNOSIS — G8921 Chronic pain due to trauma: Secondary | ICD-10-CM | POA: Diagnosis present

## 2015-10-31 MED ORDER — HYDROCHLOROTHIAZIDE 25 MG PO TABS
25.0000 mg | ORAL_TABLET | Freq: Every day | ORAL | Status: DC
  Administered 2015-10-31: 25 mg via ORAL
  Filled 2015-10-31: qty 1

## 2015-10-31 MED ORDER — ALBUTEROL SULFATE HFA 108 (90 BASE) MCG/ACT IN AERS: 2.0000 | INHALATION_SPRAY | Freq: Four times a day (QID) | RESPIRATORY_TRACT | 0 refills | Status: DC | PRN

## 2015-10-31 MED ORDER — LISINOPRIL 20 MG PO TABS
20.0000 mg | ORAL_TABLET | Freq: Every day | ORAL | Status: DC
  Administered 2015-10-31: 20 mg via ORAL
  Filled 2015-10-31: qty 2

## 2015-10-31 MED ORDER — CIPROFLOXACIN HCL 500 MG PO TABS: 500.0000 mg | ORAL_TABLET | Freq: Two times a day (BID) | ORAL | 0 refills | Status: DC

## 2015-10-31 MED ORDER — CLINDAMYCIN HCL 300 MG PO CAPS: 300.0000 mg | ORAL_CAPSULE | Freq: Four times a day (QID) | ORAL | 0 refills | Status: DC

## 2015-10-31 NOTE — Care Management Note (Addendum)
Case Management Note  Patient Details  Name: Andre Sherman MRN: [id number] Date of Birth: 09/19/1955  Subjective/Objective: 60 y.o. M admitted 10/27/2015 with wound to lower leg. Offered HHPT services which pt refused. Informed him that he could contact PCP if he changed his mind in the future and PCP could help make arrangements. Contacted Reggie with [facility] to have DME delivered.                    Action/Plan: Anticipate discharge home today with wife.  No further CM needs but will be available should additional discharge needs arise.   Expected Discharge Date:                  Expected Discharge Plan:     In-House Referral:     Discharge planning Services  CM Consult  Post Acute Care Choice:  Home Health, Durable Medical Equipment Choice offered to:     DME Arranged:    DME Agency:     HH Arranged:    HH Agency:     Status of Service:  In process, will continue to follow  If discussed at Long Length of Stay Meetings, dates discussed:    Additional Comments:  Delrae Sawyers, RN 10/31/2015, 2:40 PM
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
## 2015-10-31 NOTE — Progress Notes (Addendum)
Subjective: 1 Day Post-Op Procedure(s) (LRB): IRRIGATION AND DEBRIDEMENT LEFT FOOT WITH AMPUTATION OF SECOND METATARSAL (Left) Patient reports pain as moderate.  States he may go home today.   Objective: Vital signs in last 24 hours: Temp:  [97.6 F (36.4 C)-98.4 F (36.9 C)] 97.8 F (36.6 C) (09/02 0508) Pulse Rate:  [62-80] 76 (09/02 0508) Resp:  [10-19] 18 (09/02 0508) BP: (110-137)/(72-94) 137/78 (09/02 0508) SpO2:  [90 %-96 %] 92 % (09/02 0508)  Intake/Output from previous day: 09/01 0701 - 09/02 0700 In: 2000 [P.O.:1200; I.V.:800] Out: 2660 [Urine:2650; Blood:10] Intake/Output this shift: Total I/O In: 360 [P.O.:360] Out: 500 [Urine:500]   Recent Labs  10/30/15 0825  HGB 13.1    Recent Labs  10/30/15 0825  WBC 7.8  RBC 4.25  HCT 41.0  PLT 225    Recent Labs  10/29/15 0406 10/30/15 0825  NA 135 137  K 4.5 4.7  CL 101 102  CO2 28 28  BUN 20 15  CREATININE 1.03 0.94  GLUCOSE 97 106*  CALCIUM 8.9 9.0   No results for input(s): LABPT, INR in the last 72 hours.  Left foot intact with scant dried blood on gauze  Left ankle dorsi/plantar flexion intact Calf supple non tender.     Assessment/Plan: 1 Day Post-Op Procedure(s) (LRB): IRRIGATION AND DEBRIDEMENT LEFT FOOT WITH AMPUTATION OF SECOND METATARSAL (Left) Post op shoe on when out of bed Non weight bearing left leg Keep dressing clean dry and intact Follow up with Dr. Sharol Given in 1 week  [facility] 10/31/2015, 8:58 AM
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
## 2015-10-31 NOTE — Care Management Note (Deleted)
Case Management Note  Patient Details  Name: Andre Sherman MRN: [id number] Date of Birth: 1955/05/11  Subjective/Objective:                    Action/Plan:   Expected Discharge Date:                  Expected Discharge Plan:     In-House Referral:     Discharge planning Services  CM Consult  Post Acute Care Choice:  Home Health, Durable Medical Equipment Choice offered to:  Patient  DME Arranged:  Walker rolling DME Agency:  [place] Arranged:  PT, OT [facility] Agency:  [place]  Status of Service:  Completed, signed off  If discussed at [place] of Stay Meetings, dates discussed:    Additional Comments:  Delrae Sawyers, RN 10/31/2015, 2:57 PM

## 2015-10-31 NOTE — Discharge Instructions (Addendum)
Ready to quit: Yes Counseling given: Yes  Smoking cessation instruction/counseling given: by Grayling Congress, PharmD Candidate Recommend Initiation of nicotine replacement therapy PRN:  - 21 mg nicotine patches: apply one patch transdermally each morning and keep on for 24 hours. Replace patch daily. Rotate sites daily to avoid skin irritation.  - Use 4 mg gum when you have a breakthrough craving. Discussed the "chew and park" technique. Chew gum when you have a craving. When you feel a tingle, that means nicotine is being released. "Park" the gum in your cheek until the tingling feeling goes away. Repeat this process, each piece of gum lasts around 30 minutes. Avoid acidic beverages, such as coffee or soda, when chewing gum.     You were admitted to the hospital with a skin infection after a nail went into your foot.  Please continue the antibiotics you were given through 11/03/2015.  Please check your wound frequently for changes, especially because you seem to have decreased sensation and chronic pain on the bottom of your foot.  Non  weight bearing left foot , can use heel for balance.  Post-op shoe on foot when out of bed Keep dressing clean dry and intact

## 2015-11-02 ENCOUNTER — Encounter (HOSPITAL_COMMUNITY): Payer: Self-pay | Admitting: Orthopedic Surgery

## 2015-11-02 NOTE — Telephone Encounter (Signed)
Pt seen here on 8/29 and tx to ED.  Pt was admitted and had surgery  Called pt to f/u... He is home recovering and has a f/u appt w/Dr. Sharol Given on 9/11 and w/dr. Erin Hearing this week  Adv pt to return or go to ED is sx worsen  Pt verb understanding.

## 2015-11-09 ENCOUNTER — Ambulatory Visit (INDEPENDENT_AMBULATORY_CARE_PROVIDER_SITE_OTHER): Payer: Medicare Other | Admitting: Family Medicine

## 2015-11-09 ENCOUNTER — Encounter: Payer: Self-pay | Admitting: Family Medicine

## 2015-11-09 DIAGNOSIS — Z720 Tobacco use: Secondary | ICD-10-CM | POA: Diagnosis not present

## 2015-11-09 DIAGNOSIS — I10 Essential (primary) hypertension: Secondary | ICD-10-CM

## 2015-11-09 DIAGNOSIS — L03116 Cellulitis of left lower limb: Secondary | ICD-10-CM

## 2015-11-09 MED ORDER — IBUPROFEN 800 MG PO TABS: 400.0000 mg | ORAL_TABLET | Freq: Three times a day (TID) | ORAL | 1 refills | Status: DC

## 2015-11-09 NOTE — Progress Notes (Signed)
Subjective  Patient is presenting with the following illnesses  Foot Infection Saw ortho today.  Feels much improved.  No fevers or discharge  SMOKING Feels like he has quit.  Smokes 1-3 per week now (his wifes)  Does not feel he will increase.  Reluctant to dictate to her to stop    HYPERTENSION Disease Monitoring  Home BP Monitoring (Severity) not checking has been controlled in hospital Symptoms - Chest pain- no    Dyspnea- no Medications (Modifying factors) Compliance-  regularly. Lightheadedness-  no  Edema- no Timing - continuous  Duration - years ROS - See HPI  PMH Lab Review   Potassium  Date Value Ref Range Status  10/30/2015 4.7 3.5 - 5.1 mmol/L Final   Sodium  Date Value Ref Range Status  10/30/2015 137 135 - 145 mmol/L Final   Creat  Date Value Ref Range Status  06/03/2015 1.04 0.70 - 1.33 mg/dL Final   Creatinine, Ser  Date Value Ref Range Status  10/30/2015 0.94 0.61 - 1.24 mg/dL Final          Chief Complaint noted Review of Symptoms - see HPI PMH - Smoking status noted.     Objective Vital Signs reviewed Alert nad Heart - Regular rate and rhythm.  No murmurs, gallops or rubs.    Lungs:  Normal respiratory effort, chest expands symmetrically. Lungs are clear to auscultation, no crackles or wheezes. Foot well bandaged Using crutches     Assessments/Plans  No problem-specific Assessment & Plan notes found for this encounter.   See Encounter view if individual problem A/Ps not visible See after visit summary for details of patient instuctions

## 2015-11-09 NOTE — Assessment & Plan Note (Signed)
By report much improved.  Encouraged to stop completely and to be on guard for gradual resumption

## 2015-11-09 NOTE — Assessment & Plan Note (Signed)
At Goal 

## 2015-11-09 NOTE — Assessment & Plan Note (Signed)
Improved

## 2015-11-09 NOTE — Patient Instructions (Signed)
Good to see you today!  Thanks for coming in.  Take the ibuprofen three times a day as needed for pain  Your blood pressure is good  Great chance to stop smoking altogether  Come back before you need refills

## 2015-11-27 DIAGNOSIS — E1142 Type 2 diabetes mellitus with diabetic polyneuropathy: Secondary | ICD-10-CM | POA: Diagnosis not present

## 2015-11-27 DIAGNOSIS — Z89422 Acquired absence of other left toe(s): Secondary | ICD-10-CM | POA: Diagnosis not present

## 2015-11-27 DIAGNOSIS — M86172 Other acute osteomyelitis, left ankle and foot: Secondary | ICD-10-CM | POA: Diagnosis not present

## 2015-12-02 ENCOUNTER — Ambulatory Visit (INDEPENDENT_AMBULATORY_CARE_PROVIDER_SITE_OTHER): Payer: Medicare Other | Admitting: Family Medicine

## 2015-12-02 ENCOUNTER — Encounter: Payer: Self-pay | Admitting: Family Medicine

## 2015-12-02 DIAGNOSIS — S91332D Puncture wound without foreign body, left foot, subsequent encounter: Secondary | ICD-10-CM | POA: Diagnosis not present

## 2015-12-02 DIAGNOSIS — I10 Essential (primary) hypertension: Secondary | ICD-10-CM | POA: Diagnosis not present

## 2015-12-02 DIAGNOSIS — Z720 Tobacco use: Secondary | ICD-10-CM | POA: Diagnosis not present

## 2015-12-02 DIAGNOSIS — S86902S Unspecified injury of unspecified muscle(s) and tendon(s) at lower leg level, left leg, sequela: Secondary | ICD-10-CM | POA: Diagnosis not present

## 2015-12-02 MED ORDER — FENTANYL 50 MCG/HR TD PT72: 50.0000 ug | MEDICATED_PATCH | TRANSDERMAL | 0 refills | Status: DC

## 2015-12-02 NOTE — Assessment & Plan Note (Signed)
Worsened.  Hopefully will improve as he becomes more ambulatory

## 2015-12-02 NOTE — Assessment & Plan Note (Signed)
Stable Plan to wean down fentanyl patch once foot has healed

## 2015-12-02 NOTE — Patient Instructions (Signed)
Good to see you today!  Thanks for coming in.  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

## 2015-12-02 NOTE — Progress Notes (Signed)
Subjective  Patient is presenting with the following illnesses  HYPERTENSION Disease Monitoring  Home BP Monitoring (Severity) usually <140/90 j Symptoms - Chest pain- no    Dyspnea- no Medications (Modifying factors) Compliance-  Just took his medications minutes before our appointment . Lightheadedness-  no  Edema- none new Timing - continuous  Duration - years ROS - See HPI  Chronic Leg Pain Has been complicated by his wound in that foot that is slowly healing.  He does feel he would be able to decrease his chronic pain medications some once this is healed.  Continues to take antidepressants and gabapentin.  No fever or discharge from wound or redness  Tobacco Has been smoking more now up to 7 cigs per day.  Hard not too when just sitting around waitng for foot to heal.  Understands he will need to stop completely    PMH Lab Review   Potassium  Date Value Ref Range Status  10/30/2015 4.7 3.5 - 5.1 mmol/L Final   Sodium  Date Value Ref Range Status  10/30/2015 137 135 - 145 mmol/L Final   Creat  Date Value Ref Range Status  06/03/2015 1.04 0.70 - 1.33 mg/dL Final   Creatinine, Ser  Date Value Ref Range Status  10/30/2015 0.94 0.61 - 1.24 mg/dL Final           Chief Complaint noted Review of Symptoms - see HPI PMH - Smoking status noted.     Objective Vital Signs reviewed  Left foot wound on sole - healing by secondary intention.  No discharge or redness    Assessments/Plans  No problem-specific Assessment & Plan notes found for this encounter.   See Encounter view if individual problem A/Ps not visible See after visit summary for details of patient instuctions

## 2015-12-02 NOTE — Assessment & Plan Note (Signed)
Stable healing slowly

## 2015-12-02 NOTE — Assessment & Plan Note (Signed)
BP Readings from Last 3 Encounters:  12/02/15 134/80  11/09/15 124/75  10/31/15 139/85   At goal continue current medications

## 2015-12-04 ENCOUNTER — Other Ambulatory Visit: Payer: Self-pay | Admitting: Family Medicine

## 2015-12-08 ENCOUNTER — Ambulatory Visit (INDEPENDENT_AMBULATORY_CARE_PROVIDER_SITE_OTHER): Payer: Medicare Other | Admitting: Orthopedic Surgery

## 2015-12-08 DIAGNOSIS — M86172 Other acute osteomyelitis, left ankle and foot: Secondary | ICD-10-CM

## 2015-12-08 DIAGNOSIS — Z89422 Acquired absence of other left toe(s): Secondary | ICD-10-CM

## 2015-12-08 DIAGNOSIS — E1142 Type 2 diabetes mellitus with diabetic polyneuropathy: Secondary | ICD-10-CM

## 2015-12-16 ENCOUNTER — Ambulatory Visit (INDEPENDENT_AMBULATORY_CARE_PROVIDER_SITE_OTHER): Payer: Medicare Other | Admitting: Orthopedic Surgery

## 2015-12-16 DIAGNOSIS — E1142 Type 2 diabetes mellitus with diabetic polyneuropathy: Secondary | ICD-10-CM

## 2015-12-16 DIAGNOSIS — Z89422 Acquired absence of other left toe(s): Secondary | ICD-10-CM

## 2015-12-16 DIAGNOSIS — M86172 Other acute osteomyelitis, left ankle and foot: Secondary | ICD-10-CM

## 2015-12-28 ENCOUNTER — Other Ambulatory Visit: Payer: Self-pay | Admitting: Family Medicine

## 2015-12-29 ENCOUNTER — Encounter (INDEPENDENT_AMBULATORY_CARE_PROVIDER_SITE_OTHER): Payer: Self-pay | Admitting: Family

## 2015-12-29 ENCOUNTER — Ambulatory Visit (INDEPENDENT_AMBULATORY_CARE_PROVIDER_SITE_OTHER): Payer: Medicare Other | Admitting: Family

## 2015-12-29 VITALS — Ht 70.0 in | Wt 235.0 lb

## 2015-12-29 DIAGNOSIS — S91332D Puncture wound without foreign body, left foot, subsequent encounter: Secondary | ICD-10-CM
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
## 2016-01-15 ENCOUNTER — Encounter (INDEPENDENT_AMBULATORY_CARE_PROVIDER_SITE_OTHER): Payer: Self-pay | Admitting: Family

## 2016-01-15 ENCOUNTER — Ambulatory Visit (INDEPENDENT_AMBULATORY_CARE_PROVIDER_SITE_OTHER): Payer: Medicare Other | Admitting: Family

## 2016-01-15 VITALS — Ht 65.0 in | Wt 225.0 lb

## 2016-01-15 DIAGNOSIS — M79675 Pain in left toe(s): Secondary | ICD-10-CM

## 2016-01-15 LAB — HEPATIC FUNCTION PANEL
ALK PHOS: 70 U/L (ref 40–115)
ALT: 16 U/L (ref 9–46)
AST: 23 U/L (ref 10–35)
Albumin: 4.3 g/dL (ref 3.6–5.1)
BILIRUBIN DIRECT: 0.1 mg/dL (ref <=0.2)
BILIRUBIN INDIRECT: 0.3 mg/dL (ref 0.2–1.2)
Total Bilirubin: 0.4 mg/dL (ref 0.2–1.2)
Total Protein: 7.9 g/dL (ref 6.1–8.1)

## 2016-01-15 MED ORDER — DOXYCYCLINE HYCLATE 100 MG PO TABS: 100.0000 mg | ORAL_TABLET | Freq: Two times a day (BID) | ORAL | 0 refills | Status: DC
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
## 2016-01-16 LAB — URIC ACID: Uric Acid, Serum: 8.7 mg/dL — ABNORMAL HIGH (ref 4.0–8.0)

## 2016-01-18 MED ORDER — ALLOPURINOL 100 MG PO TABS: 100.0000 mg | ORAL_TABLET | Freq: Every day | ORAL | 3 refills | Status: DC

## 2016-01-18 MED ORDER — COLCHICINE 0.6 MG PO TABS: 0.6000 mg | ORAL_TABLET | Freq: Every day | ORAL | 1 refills | Status: DC

## 2016-01-18 NOTE — Addendum Note (Signed)
Addended by: Suzan Slick on: 01/18/2016 08:38 AM   Modules accepted: Orders

## 2016-01-22 ENCOUNTER — Other Ambulatory Visit: Payer: Self-pay | Admitting: Family Medicine

## 2016-01-29 ENCOUNTER — Ambulatory Visit (INDEPENDENT_AMBULATORY_CARE_PROVIDER_SITE_OTHER): Payer: Medicare Other | Admitting: Family

## 2016-02-03 ENCOUNTER — Encounter: Payer: Self-pay | Admitting: Family Medicine

## 2016-02-03 ENCOUNTER — Ambulatory Visit (INDEPENDENT_AMBULATORY_CARE_PROVIDER_SITE_OTHER): Payer: Medicare Other | Admitting: Family Medicine

## 2016-02-03 DIAGNOSIS — I10 Essential (primary) hypertension: Secondary | ICD-10-CM | POA: Diagnosis not present

## 2016-02-03 DIAGNOSIS — Z720 Tobacco use: Secondary | ICD-10-CM

## 2016-02-03 DIAGNOSIS — S86902S Unspecified injury of unspecified muscle(s) and tendon(s) at lower leg level, left leg, sequela: Secondary | ICD-10-CM | POA: Diagnosis not present

## 2016-02-03 MED ORDER — FENTANYL 25 MCG/HR TD PT72: 25.0000 ug | MEDICATED_PATCH | TRANSDERMAL | 0 refills | Status: DC

## 2016-02-03 MED ORDER — HYDROCODONE-ACETAMINOPHEN 10-325 MG PO TABS: 1.0000 | ORAL_TABLET | Freq: Two times a day (BID) | ORAL | 0 refills | Status: DC | PRN

## 2016-02-03 NOTE — Patient Instructions (Addendum)
Good to see you today!  Thanks for coming in.  Cut down to 36mcg patch every 72 hours and take up to two Percocet per day as needed  Contact me if problems  Merry Christmas

## 2016-02-03 NOTE — Progress Notes (Signed)
Subjective  Patient is presenting with the following illnesses  Chronic Leg Pain - about the same. Pain worsens intermittently.  He does feel he can come down on his fentanyl patch.  No redness or discharge since his cellulitis has improved  HYPERTENSION Disease Monitoring  Home BP Monitoring (Severity) not checking Symptoms - Chest pain- no    Dyspnea- no Medications (Modifying factors) Compliance-  daily. Lightheadedness-  no  Edema- not new Timing - continuous  Duration - years ROS - See HPI  PMH Lab Review   Potassium  Date Value Ref Range Status  10/30/2015 4.7 3.5 - 5.1 mmol/L Final   Sodium  Date Value Ref Range Status  10/30/2015 137 135 - 145 mmol/L Final   Creat  Date Value Ref Range Status  06/03/2015 1.04 0.70 - 1.33 mg/dL Final   Creatinine, Ser  Date Value Ref Range Status  10/30/2015 0.94 0.61 - 1.24 mg/dL Final     TOBACCO  Down to about 5-6 cigs per day.  Feels that is not too bad since his cough seems better.  No shortness of breath with exertion or productive cough or weight loss      Chief Complaint noted Review of Symptoms - see HPI PMH - Smoking status noted.     Objective Vital Signs reviewed Psych:  Cognition and judgment appear intact. Alert, communicative  and cooperative with normal attention span and concentration. No apparent delusions, illusions, hallucinations     Assessments/Plans  No problem-specific Assessment & Plan notes found for this encounter.   See Encounter view if individual problem A/Ps not visible See after visit summary for details of patient instuctions

## 2016-02-05 NOTE — Assessment & Plan Note (Signed)
BP Readings from Last 3 Encounters:  02/03/16 108/62  12/02/15 134/80  11/09/15 124/75   Good control today his is asymptomatic

## 2016-02-05 NOTE — Assessment & Plan Note (Signed)
Stable Will decrease his fentanyl patch to 25 mcg q 72 hours and supplement with up to two oxycodone tablets per day.  Will recheck in one month

## 2016-02-05 NOTE — Assessment & Plan Note (Signed)
Improved but cautioned he would benefit greatly from stopping completely

## 2016-03-02 ENCOUNTER — Ambulatory Visit (INDEPENDENT_AMBULATORY_CARE_PROVIDER_SITE_OTHER): Payer: Medicare Other | Admitting: Family Medicine

## 2016-03-02 ENCOUNTER — Encounter: Payer: Self-pay | Admitting: Family Medicine

## 2016-03-02 DIAGNOSIS — S86902S Unspecified injury of unspecified muscle(s) and tendon(s) at lower leg level, left leg, sequela: Secondary | ICD-10-CM | POA: Diagnosis not present

## 2016-03-02 DIAGNOSIS — Z720 Tobacco use: Secondary | ICD-10-CM

## 2016-03-02 DIAGNOSIS — M1A0790 Idiopathic chronic gout, unspecified ankle and foot, without tophus (tophi): Secondary | ICD-10-CM | POA: Diagnosis not present

## 2016-03-02 DIAGNOSIS — M109 Gout, unspecified: Secondary | ICD-10-CM | POA: Insufficient documentation

## 2016-03-02 MED ORDER — FENTANYL 25 MCG/HR TD PT72: 25.0000 ug | MEDICATED_PATCH | TRANSDERMAL | 0 refills | Status: DC

## 2016-03-02 MED ORDER — HYDROCODONE-ACETAMINOPHEN 10-325 MG PO TABS: 1.0000 | ORAL_TABLET | Freq: Two times a day (BID) | ORAL | 0 refills | Status: DC | PRN

## 2016-03-02 MED ORDER — COLCHICINE 0.6 MG PO TABS: 0.6000 mg | ORAL_TABLET | Freq: Every day | ORAL | 3 refills | Status: DC

## 2016-03-02 NOTE — Assessment & Plan Note (Signed)
Stable tolerating narcotics wean.  See after visit summary.  Will stop patch after one more month.  Continue as needed twice a day hydrocodone

## 2016-03-02 NOTE — Patient Instructions (Signed)
Good to see you today!  Thanks for coming in.  Take one colchicine tablet every day - I sent in a new Rx  Use the fentanyl patch for one more month then stop it.  Take the hydrocodone as needed up to twice a day   Come back before you need refills  Keep working on the smoking!

## 2016-03-02 NOTE — Progress Notes (Signed)
Subjective  Patient is presenting with the following illnesses  Gout No recent flares.  Has run out of colchicine.  No RUQ pain or rash on allopurinol.     Leg Pain Tolerating the reduced patch dosage but having to take the two hydrocodone every day.  Would like to get off the patch. No skin breakdown or falls or confusion  Smoking Continues about 6 per day.  Would like to stop but no specific plan. Wife smokes also.  Mild AM cough daily    Chief Complaint noted Review of Symptoms - see HPI PMH - Smoking status noted.     Objective Vital Signs reviewed  Psych:  Cognition and judgment appear intact. Alert, communicative  and cooperative with normal attention span and concentration. No apparent delusions, illusions, hallucinations    Assessments/Plans  No problem-specific Assessment & Plan notes found for this encounter.   See Encounter view if individual problem A/Ps not visible See after visit summary for details of patient instuctions

## 2016-03-02 NOTE — Assessment & Plan Note (Signed)
Stable.  Continue colchicine for several more months since recently started allopurinol.

## 2016-03-02 NOTE — Assessment & Plan Note (Signed)
Not improving.  Will continue to counsel

## 2016-05-12 ENCOUNTER — Other Ambulatory Visit: Payer: Self-pay | Admitting: Family Medicine

## 2016-05-12 MED ORDER — HYDROCODONE-ACETAMINOPHEN 10-325 MG PO TABS
1.0000 | ORAL_TABLET | Freq: Two times a day (BID) | ORAL | 0 refills | Status: DC | PRN
Start: 2016-05-12 — End: 2016-06-08

## 2016-05-12 NOTE — Telephone Encounter (Signed)
Placed in patient pickup box  Will need an office visit before more refills

## 2016-05-12 NOTE — Telephone Encounter (Signed)
Will forward to PCP.  Amrom Ore L, RN  

## 2016-05-12 NOTE — Telephone Encounter (Signed)
Pt states he had a missed call from the clinic just now, please return call [phone] ep

## 2016-05-12 NOTE — Telephone Encounter (Signed)
Patient missed appointment yesterday due to transportation issue and is out of his Hydrocodone w/acetaminophin and he would like to get a refill script for it today.  He will wait if you can fill today?  He is here.

## 2016-05-12 NOTE — Telephone Encounter (Signed)
Patient advised we would send message to MD.

## 2016-06-03 ENCOUNTER — Other Ambulatory Visit: Payer: Self-pay | Admitting: Family Medicine

## 2016-06-08 ENCOUNTER — Ambulatory Visit (INDEPENDENT_AMBULATORY_CARE_PROVIDER_SITE_OTHER): Payer: Medicare Other | Admitting: Family Medicine

## 2016-06-08 ENCOUNTER — Encounter: Payer: Self-pay | Admitting: Family Medicine

## 2016-06-08 DIAGNOSIS — I10 Essential (primary) hypertension: Secondary | ICD-10-CM

## 2016-06-08 DIAGNOSIS — G479 Sleep disorder, unspecified: Secondary | ICD-10-CM | POA: Diagnosis not present

## 2016-06-08 DIAGNOSIS — G8929 Other chronic pain: Secondary | ICD-10-CM

## 2016-06-08 MED ORDER — DICLOFENAC SODIUM 75 MG PO TBEC: 75.0000 mg | DELAYED_RELEASE_TABLET | Freq: Two times a day (BID) | ORAL | 1 refills | Status: DC | PRN

## 2016-06-08 MED ORDER — HYDROCODONE-ACETAMINOPHEN 10-325 MG PO TABS: 1.0000 | ORAL_TABLET | Freq: Two times a day (BID) | ORAL | 0 refills | Status: DC | PRN

## 2016-06-08 NOTE — Assessment & Plan Note (Signed)
Not at goal.  Will address through pain and insomnia approaches

## 2016-06-08 NOTE — Progress Notes (Signed)
Subjective  Patient is presenting with the following illnesses  HYPERTENSION Disease Monitoring  Home BP Monitoring (Severity) not checking Symptoms - Chest pain- no    Dyspnea- no Medications (Modifying factors) Compliance-  Just took his medications 1 hour ago. Lightheadedness-  no  Edema- no Timing - continuous  Duration - years ROS - See HPI  PMH Lab Review   Potassium  Date Value Ref Range Status  10/30/2015 4.7 3.5 - 5.1 mmol/L Final   Sodium  Date Value Ref Range Status  10/30/2015 137 135 - 145 mmol/L Final   Creat  Date Value Ref Range Status  06/03/2015 1.04 0.70 - 1.33 mg/dL Final   Creatinine, Ser  Date Value Ref Range Status  10/30/2015 0.94 0.61 - 1.24 mg/dL Final        Chronic Pain Feels his pain in his leg is not as well controlled as before.  Using ibuprofen sometimes but does not have many and using 2 hydrocodone daily as well as gabapentin and tricyclic No wounds or weakness  Insomnia Having very disrupted sleep patterns.  Trouble getting to sleep then sleeps late into the day.  Feels trouble getting to sleep is his leg pain.  No set schedule or routine. No syncope or cramps    Chief Complaint noted Review of Symptoms - see HPI PMH - Smoking status noted.     Objective Vital Signs reviewed Psych:  Cognition and judgment appear intact. Alert, communicative  and cooperative with normal attention span and concentration. No apparent delusions, illusions, hallucinations     Assessments/Plans  No problem-specific Assessment & Plan notes found for this encounter.   See Encounter view if individual problem A/Ps not visible See after visit summary for details of patient instuctions

## 2016-06-08 NOTE — Assessment & Plan Note (Signed)
He seems to be in a vicious cycle.  See after visit summary will follow closely.  Depression does not seem to be the primary cause but will need to watch this closely

## 2016-06-08 NOTE — Patient Instructions (Signed)
Good to see you today!  Thanks for coming in.  Use diclofenac twice a day as needed for pain  Go to bed a every night at 11 PM Get up every morning at 7 AM  If you lay there and cant sleep get up and do something else then try again  If you only sleep 15 min the whole night then still get up at 7 AM  Come back in 1- 2 months   Make an appointment today

## 2016-06-08 NOTE — Assessment & Plan Note (Signed)
Worsened.  We decided to use regular diclofenac instead of increasing narcotics.  Also we view his sleep problems as a large contributor.

## 2016-06-23 ENCOUNTER — Ambulatory Visit (INDEPENDENT_AMBULATORY_CARE_PROVIDER_SITE_OTHER): Payer: Medicare Other

## 2016-06-23 ENCOUNTER — Ambulatory Visit (HOSPITAL_COMMUNITY)
Admission: EM | Admit: 2016-06-23 | Discharge: 2016-06-23 | Disposition: A | Discharge status: Home / Self Care | Payer: Medicare Other | Attending: Family Medicine | Admitting: Family Medicine

## 2016-06-23 ENCOUNTER — Encounter (HOSPITAL_COMMUNITY): Payer: Self-pay | Admitting: Emergency Medicine

## 2016-06-23 DIAGNOSIS — J181 Lobar pneumonia, unspecified organism: Secondary | ICD-10-CM | POA: Diagnosis not present

## 2016-06-23 DIAGNOSIS — J189 Pneumonia, unspecified organism: Secondary | ICD-10-CM | POA: Diagnosis not present

## 2016-06-23 DIAGNOSIS — R0602 Shortness of breath: Secondary | ICD-10-CM

## 2016-06-23 DIAGNOSIS — R05 Cough: Secondary | ICD-10-CM | POA: Diagnosis not present

## 2016-06-23 DIAGNOSIS — R531 Weakness: Secondary | ICD-10-CM

## 2016-06-23 LAB — POCT I-STAT, CHEM 8
BUN: 23 mg/dL — ABNORMAL HIGH (ref 6–20)
CHLORIDE: 100 mmol/L — AB (ref 101–111)
Calcium, Ion: 1.21 mmol/L (ref 1.15–1.40)
Creatinine, Ser: 1.2 mg/dL (ref 0.61–1.24)
Glucose, Bld: 133 mg/dL — ABNORMAL HIGH (ref 65–99)
HEMATOCRIT: 48 % (ref 39.0–52.0)
Hemoglobin: 16.3 g/dL (ref 13.0–17.0)
POTASSIUM: 4.9 mmol/L (ref 3.5–5.1)
SODIUM: 136 mmol/L (ref 135–145)
TCO2: 27 mmol/L (ref 0–100)

## 2016-06-23 MED ORDER — PREDNISONE 50 MG PO TABS: ORAL_TABLET | ORAL | 0 refills | Status: DC

## 2016-06-23 MED ORDER — DEXAMETHASONE SODIUM PHOSPHATE 10 MG/ML IJ SOLN
INTRAMUSCULAR | Status: AC
  Filled 2016-06-23: qty 1

## 2016-06-23 MED ORDER — IPRATROPIUM-ALBUTEROL 0.5-2.5 (3) MG/3ML IN SOLN
RESPIRATORY_TRACT | Status: AC
  Filled 2016-06-23: qty 3

## 2016-06-23 MED ORDER — ALBUTEROL SULFATE HFA 108 (90 BASE) MCG/ACT IN AERS: 1.0000 | INHALATION_SPRAY | Freq: Four times a day (QID) | RESPIRATORY_TRACT | 0 refills | Status: DC | PRN

## 2016-06-23 MED ORDER — BENZONATATE 100 MG PO CAPS: 100.0000 mg | ORAL_CAPSULE | Freq: Three times a day (TID) | ORAL | 0 refills | Status: DC

## 2016-06-23 MED ORDER — LEVOFLOXACIN 750 MG PO TABS: 750.0000 mg | ORAL_TABLET | Freq: Every day | ORAL | 0 refills | Status: AC

## 2016-06-23 MED ORDER — IPRATROPIUM-ALBUTEROL 0.5-2.5 (3) MG/3ML IN SOLN
3.0000 mL | Freq: Once | RESPIRATORY_TRACT | Status: AC
  Administered 2016-06-23: 3 mL via RESPIRATORY_TRACT

## 2016-06-23 MED ORDER — DEXAMETHASONE SODIUM PHOSPHATE 10 MG/ML IJ SOLN
10.0000 mg | Freq: Once | INTRAMUSCULAR | Status: AC
  Administered 2016-06-23: 10 mg via INTRAMUSCULAR

## 2016-06-23 NOTE — Discharge Instructions (Signed)
You have community acquired pneumonia. I have started you on Levaquin, take one tablet by mouth every day. Also given a short course of prednisone, take one tablet by mouth every day with food, and Tessalon for cough 1 tablet every 8 hours as needed, and albuterol inhaler 1-2 puffs every 4-6 hours as needed. Follow up with your primary care provider in 2 weeks to ensure clearance of your infection, or at any time you have worsening symptoms go to the ER immediately.

## 2016-06-23 NOTE — ED Triage Notes (Signed)
PT reports productive cough and general fatigue for 5 days. PT states, "last time I felt like that I had to go to the hospital for a blood transfusion."
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
## 2016-07-06 ENCOUNTER — Ambulatory Visit (INDEPENDENT_AMBULATORY_CARE_PROVIDER_SITE_OTHER): Payer: Medicare Other | Admitting: Family Medicine

## 2016-07-06 ENCOUNTER — Encounter: Payer: Self-pay | Admitting: Family Medicine

## 2016-07-06 DIAGNOSIS — S86902S Unspecified injury of unspecified muscle(s) and tendon(s) at lower leg level, left leg, sequela: Secondary | ICD-10-CM | POA: Diagnosis not present

## 2016-07-06 MED ORDER — HYDROCODONE-ACETAMINOPHEN 10-325 MG PO TABS: 1.0000 | ORAL_TABLET | Freq: Two times a day (BID) | ORAL | 0 refills | Status: DC | PRN

## 2016-07-06 NOTE — Progress Notes (Signed)
Subjective  Patient is presenting with the following illnesses  Leg Pain Feels his leg pain is not being well controlled with current regimen.  He did get his next Rx wet and would like a replacement.  He arrived late for his visit today and has not made much progress on having a regular schedule.  No unusual swelling or redness   Chief Complaint noted Review of Symptoms - see HPI PMH - Smoking status noted.     Objective Vital Signs reviewed Alert nad Left leg wrapped and clean  Checked [facility] database no inappropriate refills   Assessments/Plans  No problem-specific Assessment & Plan notes found for this encounter.   See Encounter view if individual problem A/Ps not visible See after visit summary for details of patient instuctions

## 2016-07-06 NOTE — Patient Instructions (Signed)
Glad you are getting over the pneumonia  Come before you need another refill   Bring all your medication bottles

## 2016-07-06 NOTE — Assessment & Plan Note (Signed)
Not controlled By report he feels he has more pain than when he was on the fentanyl patch.  Has not made progress in improving his sleep nor routine.   Will replace the Rx he got wet and have him come back for an office visit before he needs a refill to review further

## 2016-07-08 ENCOUNTER — Other Ambulatory Visit: Payer: Self-pay | Admitting: Family Medicine

## 2016-07-30 ENCOUNTER — Ambulatory Visit (INDEPENDENT_AMBULATORY_CARE_PROVIDER_SITE_OTHER): Payer: Medicare Other

## 2016-07-30 ENCOUNTER — Ambulatory Visit (HOSPITAL_COMMUNITY)
Admission: EM | Admit: 2016-07-30 | Discharge: 2016-07-30 | Disposition: A | Discharge status: Home / Self Care | Payer: Medicare Other | Attending: Internal Medicine | Admitting: Internal Medicine

## 2016-07-30 ENCOUNTER — Encounter (HOSPITAL_COMMUNITY): Payer: Self-pay | Admitting: *Deleted

## 2016-07-30 DIAGNOSIS — R5383 Other fatigue: Secondary | ICD-10-CM

## 2016-07-30 DIAGNOSIS — R05 Cough: Secondary | ICD-10-CM | POA: Diagnosis not present

## 2016-07-30 DIAGNOSIS — R059 Cough, unspecified: Secondary | ICD-10-CM

## 2016-07-30 HISTORY — DX: Pneumonia, unspecified organism: J18.9

## 2016-07-30 HISTORY — DX: Compartment syndrome, unspecified, initial encounter: T79.A0XA

## 2016-07-30 MED ORDER — ACETAMINOPHEN 325 MG PO TABS
650.0000 mg | ORAL_TABLET | Freq: Once | ORAL | Status: AC
  Administered 2016-07-30: 650 mg via ORAL

## 2016-07-30 MED ORDER — ACETAMINOPHEN 325 MG PO TABS
ORAL_TABLET | ORAL | Status: AC
  Filled 2016-07-30: qty 2

## 2016-07-30 MED ORDER — ALBUTEROL SULFATE (2.5 MG/3ML) 0.083% IN NEBU: 2.5000 mg | INHALATION_SOLUTION | Freq: Once | RESPIRATORY_TRACT | Status: DC

## 2016-07-30 MED ORDER — DICLOFENAC SODIUM 75 MG PO TBEC: 75.0000 mg | DELAYED_RELEASE_TABLET | Freq: Two times a day (BID) | ORAL | 0 refills | Status: DC | PRN
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
## 2016-07-30 NOTE — ED Triage Notes (Signed)
C/O generalized body aches, fatigue, chills x 2 days.  Has not taken any meds today.

## 2016-07-30 NOTE — ED Notes (Signed)
States last dose hydrocodone @ 1000 today.

## 2016-08-03 ENCOUNTER — Ambulatory Visit (INDEPENDENT_AMBULATORY_CARE_PROVIDER_SITE_OTHER): Payer: Medicare Other | Admitting: Family Medicine

## 2016-08-03 DIAGNOSIS — L03116 Cellulitis of left lower limb: Secondary | ICD-10-CM

## 2016-08-03 DIAGNOSIS — S86902S Unspecified injury of unspecified muscle(s) and tendon(s) at lower leg level, left leg, sequela: Secondary | ICD-10-CM | POA: Diagnosis not present

## 2016-08-03 DIAGNOSIS — L02416 Cutaneous abscess of left lower limb: Secondary | ICD-10-CM | POA: Diagnosis not present

## 2016-08-03 MED ORDER — HYDROCODONE-ACETAMINOPHEN 10-325 MG PO TABS: 1.0000 | ORAL_TABLET | Freq: Three times a day (TID) | ORAL | 0 refills | Status: DC | PRN

## 2016-08-03 MED ORDER — CEPHALEXIN 500 MG PO CAPS: 500.0000 mg | ORAL_CAPSULE | Freq: Four times a day (QID) | ORAL | 0 refills | Status: DC

## 2016-08-03 NOTE — Assessment & Plan Note (Signed)
Early without abscess.  Was on levaquin a month ago for pneumonia.  Will start Keflex and monitor.  He was told as a child he had a pcn rash.  Never taken since

## 2016-08-03 NOTE — Patient Instructions (Signed)
Good to see you today!  Thanks for coming in.  Get the antibiotic ASAP and take all over the next week.  If worsening or high fever go to the ER.  The hydrocodone Rx should last 30 days  Make an appointment before need refill  Way we will know your function is improving  Less smoking - aim is to stop  Stop drinking completely  Losing weight

## 2016-08-03 NOTE — Assessment & Plan Note (Signed)
Worsened.  See after visit summary.  Will increase to 80 pills per month and see if his overall functioning improve.  He agrees
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
## 2016-08-06 ENCOUNTER — Other Ambulatory Visit (INDEPENDENT_AMBULATORY_CARE_PROVIDER_SITE_OTHER): Payer: Self-pay | Admitting: Family

## 2016-08-15 ENCOUNTER — Telehealth: Payer: Self-pay | Admitting: Family Medicine
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
## 2016-08-19 ENCOUNTER — Telehealth: Payer: Self-pay | Admitting: Family Medicine

## 2016-08-19 ENCOUNTER — Ambulatory Visit (HOSPITAL_COMMUNITY)
Admission: EM | Admit: 2016-08-19 | Discharge: 2016-08-19 | Disposition: A | Discharge status: Home / Self Care | Payer: Medicare Other | Attending: Family Medicine | Admitting: Family Medicine

## 2016-08-19 ENCOUNTER — Encounter (HOSPITAL_COMMUNITY): Payer: Self-pay | Admitting: Family Medicine

## 2016-08-19 DIAGNOSIS — L03116 Cellulitis of left lower limb: Secondary | ICD-10-CM | POA: Diagnosis not present

## 2016-08-19 DIAGNOSIS — M79604 Pain in right leg: Secondary | ICD-10-CM | POA: Diagnosis not present

## 2016-08-19 MED ORDER — HYDROCODONE-ACETAMINOPHEN 10-325 MG PO TABS: 1.0000 | ORAL_TABLET | Freq: Two times a day (BID) | ORAL | 0 refills | Status: DC | PRN

## 2016-08-19 MED ORDER — CLINDAMYCIN HCL 150 MG PO CAPS: 150.0000 mg | ORAL_CAPSULE | Freq: Four times a day (QID) | ORAL | 0 refills | Status: DC

## 2016-08-19 MED ORDER — KETOCONAZOLE 2 % EX CREA: 1.0000 "application " | TOPICAL_CREAM | Freq: Two times a day (BID) | CUTANEOUS | 1 refills | Status: DC
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
## 2016-08-19 NOTE — Telephone Encounter (Signed)
Pt wanted PCP to know he has an infection on his toe and is currently waiting to be seen at Urgent Care. ep

## 2016-08-19 NOTE — ED Triage Notes (Signed)
Pt here for chronic left foot pain and swelling.

## 2016-08-19 NOTE — Telephone Encounter (Signed)
Patient walked into clinic just for lunch today stating he was at the Urgent Care and given a prescription for pain medication.  He tried to get filled at the pharmacy but they told him he needed an authorization from PCP.  Called [facility] to clarify what the patient needed.  Pt had pain medication filled on 08/03/16, Rx written by PCP.  Per the pharmacist patient can not have it filled until 08/27/16.  PCP is aware and will not authorized the fill of a new Rx written by a different provider.  Pt should have medication left from the Rx written by PCP.  Pt will need to take the Rx back to the other provider to discuss getting it filled.  [facility] pharmacist is aware.  Derl Barrow, RN

## 2016-08-20 ENCOUNTER — Other Ambulatory Visit (INDEPENDENT_AMBULATORY_CARE_PROVIDER_SITE_OTHER): Payer: Self-pay | Admitting: Family

## 2016-08-22 NOTE — Telephone Encounter (Signed)
Ok for refill? 

## 2016-09-02 ENCOUNTER — Encounter: Payer: Self-pay | Admitting: Family Medicine

## 2016-09-02 ENCOUNTER — Ambulatory Visit (INDEPENDENT_AMBULATORY_CARE_PROVIDER_SITE_OTHER): Payer: Medicare Other | Admitting: Family Medicine

## 2016-09-02 ENCOUNTER — Ambulatory Visit: Payer: Medicare Other | Admitting: Internal Medicine

## 2016-09-02 VITALS — BP 118/60 | HR 83 | Temp 98.7°F | Wt 228.0 lb

## 2016-09-02 DIAGNOSIS — L608 Other nail disorders: Secondary | ICD-10-CM | POA: Diagnosis not present

## 2016-09-02 MED ORDER — HYDROCODONE-ACETAMINOPHEN 10-325 MG PO TABS: 1.0000 | ORAL_TABLET | Freq: Three times a day (TID) | ORAL | 0 refills | Status: DC | PRN

## 2016-09-02 NOTE — Patient Instructions (Addendum)
Follow up with podiatrist or dermatologist for probable toenail removal.   Follow up with Dr. Erin Hearing regarding pain medication.  Come back to clinic or go to urgent care if toe gets more red, swollen, or turns a dark color.

## 2016-09-02 NOTE — Assessment & Plan Note (Addendum)
Left third toe has overgrown nail with what appears to be a fungal infection. No sign of acute bacterial infection. Patient to follow up with podiatrist or dermatologist for probable toenail removal.
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
## 2016-09-05 ENCOUNTER — Other Ambulatory Visit: Payer: Self-pay | Admitting: Family Medicine

## 2016-09-05 MED ORDER — HYDROCODONE-ACETAMINOPHEN 10-325 MG PO TABS: 1.0000 | ORAL_TABLET | Freq: Two times a day (BID) | ORAL | 0 refills | Status: DC | PRN

## 2016-09-05 NOTE — Telephone Encounter (Signed)
Pt was calling to check the status of his request for his pain medication jw

## 2016-09-05 NOTE — Telephone Encounter (Signed)
Rx written and given to Red Team  Pls let patient know this should last him till his appointment on 8-1 [place]

## 2016-09-05 NOTE — Telephone Encounter (Signed)
Pt saw dr Enid Derry on Friday for refill on his pain med.  Dr only gave him 10 pills. He was told to contact dr Erin Hearing so he can refill the remainder of the prescription.  Please call pt when ready for pickup

## 2016-09-06 NOTE — Telephone Encounter (Signed)
Pt picked up rx yesterday. Andre Sherman, CMA

## 2016-09-23 ENCOUNTER — Encounter: Payer: Self-pay | Admitting: Podiatry

## 2016-09-23 ENCOUNTER — Ambulatory Visit (INDEPENDENT_AMBULATORY_CARE_PROVIDER_SITE_OTHER): Payer: Medicare Other | Admitting: Podiatry

## 2016-09-23 ENCOUNTER — Ambulatory Visit (INDEPENDENT_AMBULATORY_CARE_PROVIDER_SITE_OTHER): Payer: Medicare Other

## 2016-09-23 VITALS — BP 147/98 | HR 74

## 2016-09-23 DIAGNOSIS — M79672 Pain in left foot: Secondary | ICD-10-CM

## 2016-09-23 MED ORDER — SULFAMETHOXAZOLE-TRIMETHOPRIM 800-160 MG PO TABS: 1.0000 | ORAL_TABLET | Freq: Two times a day (BID) | ORAL | 1 refills | Status: DC

## 2016-09-23 MED ORDER — CIPROFLOXACIN HCL 500 MG PO TABS: 500.0000 mg | ORAL_TABLET | Freq: Two times a day (BID) | ORAL | 0 refills | Status: DC

## 2016-09-23 NOTE — Progress Notes (Signed)
   Subjective:    Patient ID: Andre Sherman, male    DOB: Apr 06, 1955, 61 y.o.   MRN: [id number]  HPI  Chief Complaint  Patient presents with  . Foot Pain    Lt foot nerve pain 1-2 months, infection problems due to neuropathy       Review of Systems  HENT: Positive for tinnitus.   Cardiovascular: Positive for leg swelling.  Hematological: Positive for adenopathy.  All other systems reviewed and are negative.      Objective:   Physical Exam        Assessment & Plan:
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
## 2016-09-26 ENCOUNTER — Ambulatory Visit (HOSPITAL_COMMUNITY)
Admission: EM | Admit: 2016-09-26 | Discharge: 2016-09-26 | Disposition: A | Discharge status: Nursing Home (Includes ICF) | Payer: Medicare Other

## 2016-09-28 ENCOUNTER — Ambulatory Visit (INDEPENDENT_AMBULATORY_CARE_PROVIDER_SITE_OTHER): Payer: Medicare Other | Admitting: Family Medicine

## 2016-09-28 ENCOUNTER — Encounter: Payer: Self-pay | Admitting: Licensed Clinical Social Worker

## 2016-09-28 ENCOUNTER — Telehealth: Payer: Self-pay | Admitting: Family Medicine

## 2016-09-28 ENCOUNTER — Encounter: Payer: Self-pay | Admitting: Family Medicine

## 2016-09-28 DIAGNOSIS — F329 Major depressive disorder, single episode, unspecified: Secondary | ICD-10-CM | POA: Diagnosis not present

## 2016-09-28 DIAGNOSIS — G8929 Other chronic pain: Secondary | ICD-10-CM

## 2016-09-28 DIAGNOSIS — F32A Depression, unspecified: Secondary | ICD-10-CM

## 2016-09-28 MED ORDER — FENTANYL 25 MCG/HR TD PT72: 25.0000 ug | MEDICATED_PATCH | TRANSDERMAL | 0 refills | Status: DC

## 2016-09-28 MED ORDER — FENTANYL 50 MCG/HR TD PT72: 50.0000 ug | MEDICATED_PATCH | TRANSDERMAL | 0 refills | Status: DC

## 2016-09-28 NOTE — Telephone Encounter (Signed)
PA completed online at [email or web address]. PA is pending per [facility].  Derl Barrow, RN

## 2016-09-28 NOTE — Progress Notes (Addendum)
Total time:20 minutes Type of Service: [place] warm handoff  Interpretor:No.    SUBJECTIVE: Andre Sherman is a 61 y.o. male  Patient was referred by Dr. Erin Hearing for:  Assistance with managing social stressors.  Patient reports the following concerns:  Family stressors and dealing with chronic pain.  Patient drinks about one 40oz of beer per day and smokes 1/2 to 1 pack of cigarettes per day.  No hx of therapy or inpatient pschy. treatment.  LIFE CONTEXT:  Family & Social: lives with wife of 64 years, has 3 adult children  School/ Work: received disability  Life changes: 39 yr old son just moved out  GOALS:  Patient will reduce symptoms of: stress, , and increase knowledge and/or ability [id number] skills and stress reduction,   Intervention: Reflective listening, , Supportive Counseling and Referral to Counselor/Psychotherapist   Issues discussed: Things that have helped in the past,  review of integrated care services, identifying top stressors and discussed community options for on going therapy,      ASSESSMENT:Patient currently experiencing difficulty with managing his stressors.  Stress exacerbated by chronic pain. Patient believes he will feel better once he gets the pain under control.  Patient may benefit from, and is in agreement to receive further assessment and on-going therapy  to assist with managing his stress, patient will meet with [facility] for brief therapeutic interventions until connected to community resources.  PLAN: 1. Patient will call resources provided 2. LCSW will f/u with patient via phone call in in 1 week. 3. Patient will schedule f/u with [facility] as needed.  Warm Hand Off Completed.     Casimer Lanius, LCSW Licensed Clinical Social Worker [place]   [phone] 11:50 AM

## 2016-09-28 NOTE — Assessment & Plan Note (Signed)
Worsened.  He believes long term counseling may help.  He will meet with IC today

## 2016-09-28 NOTE — Telephone Encounter (Signed)
PA approved for Fentanyl 25 mcg/hr patch valid dates: 08/29/16-09/28/17. CaseID: [id number]. Covermymeds key: [id number]. Derl Barrow, RN

## 2016-09-28 NOTE — Progress Notes (Signed)
Subjective  Patient is presenting with the following illnesses  Limb foot pain Continues to have poorly controlled pain in his left lower leg and now has infection in the 3rd toe on that foot (see podiatry notes)  No fever or chills or nausea and vomiting Has been taking 2-3 percocets a day but not keeping pain under control.  This keeps him from exercising and causes irritability and smoking more.  He would like to go back to the fentanyl patch which he feels gave him more consistent pain relief   DEPRESSION Not doing well with above and with his sons' mental illnesses.  No suicidal ideation but does not feel like getting out of bed or and is angry a lot    Chief Complaint noted Review of Symptoms - see HPI PMH - Smoking status noted.     Objective Vital Signs reviewed Left foot - distal swelling of 3rd toe.  No purulent discharge  Psych:  Cognition and judgment appear intact. Alert, communicative  and cooperative with normal attention span and concentration. No apparent delusions, illusions, hallucinations    Assessments/Plans  No problem-specific Assessment & Plan notes found for this encounter.   See Encounter view if individual problem A/Ps not visible See after visit summary for details of patient instuctions

## 2016-09-28 NOTE — Patient Instructions (Addendum)
Good to see you today!  Thanks for coming in.  Continue all medications as you are  Start with Fentanyl patch 25 mcg every 3 days.  If this is not sufficient then call me   Ms Laurance Flatten will meet with you to discuss counseling

## 2016-09-28 NOTE — Telephone Encounter (Signed)
Pt needs PA on fentanyl patch. [facility] [address] ep

## 2016-09-28 NOTE — Assessment & Plan Note (Addendum)
Will resume the fentanyl patch at 25 mcg per day.  He feels he will need 50 mcg per day since that was his previous dose when he felt his pain was well controlled.   He vows that he will be able to be more active and lose down to 200 lbs and stop smoking within 6 months.  He agrees not to use other pain medications even if he needs an amputation.  Rx for fentanyl patch 50 mcg was initially  printed in error.  This was shredded

## 2016-10-05 ENCOUNTER — Other Ambulatory Visit: Payer: Self-pay | Admitting: Family Medicine

## 2016-10-05 ENCOUNTER — Ambulatory Visit (INDEPENDENT_AMBULATORY_CARE_PROVIDER_SITE_OTHER): Payer: Medicare Other | Admitting: Licensed Clinical Social Worker

## 2016-10-05 DIAGNOSIS — F439 Reaction to severe stress, unspecified: Secondary | ICD-10-CM

## 2016-10-05 MED ORDER — FENTANYL 25 MCG/HR TD PT72: 25.0000 ug | MEDICATED_PATCH | TRANSDERMAL | 0 refills | Status: DC

## 2016-10-06 NOTE — Progress Notes (Signed)
  Total time:30 minutes Type of Service: Integrated Behavioral Health F/U Interpretor:No.    SUBJECTIVE: Andre Sherman is a 61 y.o. male  Patient was referred by Dr. Lenon Ahmadi for: symptoms of  depression.  Patient reports the following symptoms/concerns: feeling down due to family stressors.   Reason for follow-up: Continue brief intervention to address symptoms associated with stress.  Patient presented pleasant and engaged in conversation. Reports son has recently moved back in,   LIFE CONTEXT:  Family & Social: lives with wife of 35 years, has 3 adult children  School/ Work: received disability  Life changes: 53 yr old son just moved back in after recently moving to patient's daughter's home,  She put the son out due to his behavior.   GOALS:  Patient will reduce symptoms of: depression and stress, , and increase knowledge and/or ability WV:PXTGGY skills, self-management skills and stress reduction,  Intervention: Motivational Interviewing, Solution-Focused Strategies and Link to [employer],   Issues discussed: community resources for son that does not require insurance, managing patient's stressors, patient's chronic pain,   ASSESSMENT:Patient currently experiencing Stress and symptoms of depression. Symptoms exacerbated by behavior of adult son and patient trying to help him.  Patient may benefit from, and is in agreement to receive further assessment and brief therapeutic interventions to assist with managing his symptoms.   PLAN: 1. Patient will F/U with LCSW in one week 2. Behavioral recommendations: relaxation techniques 3. Referral: patient will share ADS, [facility] and Lady [facility] He[facility] with his son.  Casimer Lanius, LCSW Licensed Clinical Social Worker [place]   [phone] 8:45 AM

## 2016-10-07 ENCOUNTER — Encounter: Payer: Self-pay | Admitting: Podiatry

## 2016-10-07 ENCOUNTER — Ambulatory Visit (INDEPENDENT_AMBULATORY_CARE_PROVIDER_SITE_OTHER): Payer: Medicare Other | Admitting: Podiatry

## 2016-10-07 VITALS — BP 118/68 | HR 82 | Resp 16

## 2016-10-07 DIAGNOSIS — L03119 Cellulitis of unspecified part of limb: Secondary | ICD-10-CM | POA: Diagnosis not present

## 2016-10-07 DIAGNOSIS — L02619 Cutaneous abscess of unspecified foot: Secondary | ICD-10-CM | POA: Diagnosis not present

## 2016-10-07 DIAGNOSIS — M79672 Pain in left foot: Secondary | ICD-10-CM | POA: Diagnosis not present

## 2016-10-07 MED ORDER — CLINDAMYCIN HCL 300 MG PO CAPS: 300.0000 mg | ORAL_CAPSULE | Freq: Three times a day (TID) | ORAL | 1 refills | Status: DC

## 2016-10-07 NOTE — Progress Notes (Signed)
Subjective:    Patient ID: Andre Sherman, male   DOB: 61 y.o.   MRN: [id number]   HPI patient states my toe feels a lot better    ROS      Objective:  Physical Exam neurovascular status unchanged with patient noted to have distal keratotic lesion digit 3 left that's not draining with significant diminishment of swelling of the toe itself     Assessment:    Appears to be improving from abscess type cellulitic event of the left third digit with probable osteomyelitis present     Plan:    We will continue clindamycin for the next several weeks and I did discuss that if it were to start to swell or drainage and the toe will need to be amputated due to distal absorption of bone. There is a strong indication strong chance it will require amputation but were in a watch it and I gave him strict instructions if he should develop any proximal edema erythema drainage or any systemic signs of infection he is to go straight to the emergency room

## 2016-10-13 ENCOUNTER — Ambulatory Visit: Payer: Medicare Other

## 2016-10-25 DIAGNOSIS — Z23 Encounter for immunization: Secondary | ICD-10-CM | POA: Diagnosis not present

## 2016-10-26 ENCOUNTER — Encounter: Payer: Self-pay | Admitting: Family Medicine

## 2016-10-26 ENCOUNTER — Ambulatory Visit (INDEPENDENT_AMBULATORY_CARE_PROVIDER_SITE_OTHER): Payer: Medicare Other | Admitting: Family Medicine

## 2016-10-26 ENCOUNTER — Encounter: Payer: Self-pay | Admitting: Licensed Clinical Social Worker

## 2016-10-26 VITALS — BP 120/78 | HR 80 | Temp 98.2°F | Ht 70.0 in | Wt 230.0 lb

## 2016-10-26 DIAGNOSIS — G8929 Other chronic pain: Secondary | ICD-10-CM

## 2016-10-26 DIAGNOSIS — Z720 Tobacco use: Secondary | ICD-10-CM | POA: Diagnosis not present

## 2016-10-26 DIAGNOSIS — R739 Hyperglycemia, unspecified: Secondary | ICD-10-CM | POA: Diagnosis not present

## 2016-10-26 DIAGNOSIS — I10 Essential (primary) hypertension: Secondary | ICD-10-CM | POA: Diagnosis not present

## 2016-10-26 LAB — POCT GLYCOSYLATED HEMOGLOBIN (HGB A1C): Hemoglobin A1C: 5.8

## 2016-10-26 MED ORDER — FENTANYL 25 MCG/HR TD PT72: 25.0000 ug | MEDICATED_PATCH | TRANSDERMAL | 0 refills | Status: DC

## 2016-10-26 NOTE — Assessment & Plan Note (Signed)
Improved.  Will follow weight and smoking to see if he is able to achieve these goals

## 2016-10-26 NOTE — Progress Notes (Addendum)
Subjective  Patient is presenting with the following illnesses  Pain in Leg Improved with the fentanyl patch.   Able to move around better.  No edema or skin breakdown.   Not taking any more oral narcotics.  Planning how to stop smoking and work on weight.  Seeing IC today  Callus on Left Foot Painful when he walks.  No redness or discharge.  His toe is healing.  He is on chronic clindamycin per podiatry.  HYPERTENSION Disease Monitoring  Home BP Monitoring (Severity) not checking Symptoms - Chest pain- no    Dyspnea- no Medications (Modifying factors) Compliance-  daily. Lightheadedness-  no  Edema- no Timing - continuous  Duration - years ROS - See HPI  PMH Lab Review   Potassium  Date Value Ref Range Status  06/23/2016 4.9 3.5 - 5.1 mmol/L Final   Sodium  Date Value Ref Range Status  06/23/2016 136 135 - 145 mmol/L Final   Creat  Date Value Ref Range Status  06/03/2015 1.04 0.70 - 1.33 mg/dL Final   Creatinine, Ser  Date Value Ref Range Status  06/23/2016 1.20 0.61 - 1.24 mg/dL Final          Chief Complaint noted Review of Symptoms - see HPI PMH - Smoking status noted.     Objective Vital Signs reviewed 1.5 cm Callus on lateral left plantar surface - shved down about 3 mm with 11 blade.  Tolerated well without any bleeding  Toes on Left foot - middle one has large bulbous swelling at distal tip wihout redness or skin breakdown    Assessments/Plans  No problem-specific Assessment & Plan notes found for this encounter.  CALLUS - shaved.  See after visit summary   See Encounter view if individual problem A/Ps not visible See after visit summary for details of patient instuctions

## 2016-10-26 NOTE — Assessment & Plan Note (Signed)
Stable continue meds

## 2016-10-26 NOTE — Progress Notes (Signed)
  Total time:30 minutes Type of Service: [place] F/U and Warmhandoff Interpretor:No.    SUBJECTIVE: Andre Sherman is a 61 y.o. male  Patient was referred by Dr. Brooke Dare for: stopping tobacco use  and managing stressors.  Patient reports he would like to quit smoking. Reports he has smoked most of his life and knows he needs to stop for health reasons.  "I want to feel better and be around for my grandchildren".   Reason for follow-up: Continue brief intervention to address symptoms associated with stress. Patient presented pleasant and engaged.  Reports less stress at home with his son now want to focus on goal for himself.    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  GOALS:  Patient will reduce symptoms of: stress and smoking, he will increase knowledge KD:XIPJASN habits and stress reduction,  Patient is making progress with managing his stress.  States he is feeling better about his son and now wants to focus on his needs and staying healthy.   Intervention: Motivational Interviewing, Reflective listening and, Psychoeducation, resources for the Quitline    Issues discussed: smoking hx, reasons and benefits of quitting, how to continue supporting son, and self care.     ASSESSMENT:Patient has decided to quit smoking. Believes this is a good time since his son is now settled.  Patient may benefit from, and is in agreement to receive further assessment and brief therapeutic interventions to assist with managing his stress and behavior change with his goal of stop smoking.   PLAN: 1. Patient will F/U with LCSW in two weeks 2. Referral: patient will call the Quitline phone number   Warm Hand Off Completed.     Casimer Lanius, LCSW Licensed Clinical Social Worker [place]   [phone] 11:35  AM

## 2016-10-26 NOTE — Assessment & Plan Note (Signed)
Well controlle with normal a1c

## 2016-10-26 NOTE — Patient Instructions (Signed)
Good to see you today!  Thanks for coming in.  Use a donut pad on the callus.  Call if any signs of infection  You know your homework!   Come back in 2 month in time for a refill

## 2016-10-26 NOTE — Assessment & Plan Note (Signed)
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

## 2016-11-01 ENCOUNTER — Telehealth: Payer: Self-pay | Admitting: *Deleted

## 2016-11-01 NOTE — Telephone Encounter (Signed)
Patient left message on nurse line stating pharmacy would only give him 5 out of the 10 fentanyl patches that were prescribed and that he needs a new rx for the remaining 5 patches.

## 2016-11-03 MED ORDER — FENTANYL 25 MCG/HR TD PT72: 25.0000 ug | MEDICATED_PATCH | TRANSDERMAL | 0 refills | Status: DC

## 2016-11-03 NOTE — Telephone Encounter (Signed)
  Accessed [place] databaseSee below  Last Name:  [facility]:  First Name:  Jed  Zip Code:  Date of Birth:  Jan 10, 1956  Dispensed Start Date:  05/07/2016 Dispensed End Date:  11/03/2016  Recipients:         Dispensed From 05/07/2016 to 11/03/2016 4 out of 4 Recipient(s) Selected [place], Blas - DOB: 09-02-1955 - [address] Reuel Boom - DOB: 05/04/1955 - [address] Reuel Boom - DOB: [birth year] - [address]: 09/25/1955 - [place] " Date Dispensed/ Date Prescribed Drug Name/ NDC Qty. Dispensed/ Days Supply Refill #/ Authorized Refills Programmer, multimedia Recipient *Pmt. Method **MED Daily  10/30/2016 10/30/2016 FENTANYL 25 MCG/ HR PATCH [id number] 5 15 0 0 [id number] CHAMBLISS MARSHALL LEE MD [place], [place] [id number] [place] [facility] Lady Gary [place] JAKOBI, THETFORD [birth year] [place], [place] [address] 03 60  10/12/2016 10/05/2016 FENTANYL 25 MCG/ HR PATCH [id number] 5 15 0 0 [id number] [place], [place] [id number] [place] [facility] Lady Gary [place] KAELEB, EMOND 1955-11-20 [name], [place] [address] 03 60  09/28/2016 09/28/2016 FENTANYL 25 MCG/ HR PATCH [id number] 5 15 0 0 [id number] [place], [place] [id number] [place] [facility] Lady Gary [place] CLOIS, TREANOR 06-10-1955 [place], [place] [address] 03 60  09/05/2016 09/05/2016 HYDROCODONE- ACETAMIN 10- 325 MG [id number] 60 30 0 0 [id number] CHAMBLISS MARSHALL LEE MD [place], [place] [id number] [place] [facility] Lady Gary, [place], Jaspreet L 12-Jun-1955 [place], [place] [id number] Tomasita Crumble

## 2016-11-03 NOTE — Telephone Encounter (Signed)
Spoke with him  He filled the first Rx but only for 5  Awaiting prior auth  Let him know another Rx for 5 is up front to pick up

## 2016-11-09 ENCOUNTER — Ambulatory Visit: Payer: Medicare Other

## 2016-11-27 ENCOUNTER — Other Ambulatory Visit: Payer: Self-pay | Admitting: Family Medicine

## 2016-11-29 ENCOUNTER — Encounter: Payer: Self-pay | Admitting: Licensed Clinical Social Worker

## 2016-11-29 ENCOUNTER — Telehealth: Payer: Self-pay | Admitting: Family Medicine

## 2016-11-29 ENCOUNTER — Other Ambulatory Visit (INDEPENDENT_AMBULATORY_CARE_PROVIDER_SITE_OTHER): Payer: Self-pay | Admitting: Family

## 2016-11-29 NOTE — Telephone Encounter (Signed)
Patient came to office stated he is out of fentanyl patches. Patient stated he needs to get 2 boxes per month. If any questions please call him at [phone]
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
## 2016-11-29 NOTE — Telephone Encounter (Signed)
[facility]. Plan limit exceeded. Pa form placed in provider box for review.  Derl Barrow, RN

## 2016-11-30 NOTE — Telephone Encounter (Signed)
[facility]  Derl Barrow, RN

## 2016-12-05 NOTE — Telephone Encounter (Signed)
PA was approved with valid dates 08/29/16-09/28/2017. Approval number: [id number].  Derl Barrow, RN

## 2016-12-12 ENCOUNTER — Telehealth: Payer: Self-pay

## 2016-12-12 MED ORDER — FENTANYL 25 MCG/HR TD PT72: 25.0000 ug | MEDICATED_PATCH | TRANSDERMAL | 0 refills | Status: DC

## 2016-12-12 NOTE — Telephone Encounter (Signed)
Please let patient know his Rx is up front  He has been approved for 10 each month going forward  Needs to see me before more refills   Thanks

## 2016-12-12 NOTE — Telephone Encounter (Signed)
Pt calling to request refill VZ:CHYIFOYD Patches  Name of Medication(s):  Fentanyl patches (was only able to get 5 of the 10 patches on the last Rx. Needed PA) Last date of OV:  10/26/2016 [facility] [name]  Will route refill request to Clinic RN.  Discussed with patient policy to call pharmacy for future refills.  Also, discussed refills may take up to 48 hours to approve or deny.  Ottis Stain

## 2016-12-12 NOTE — Addendum Note (Signed)
Addended by: Talbert Cage L on: 12/12/2016 04:28 PM   Modules accepted: Orders

## 2016-12-13 ENCOUNTER — Other Ambulatory Visit: Payer: Self-pay | Admitting: *Deleted

## 2016-12-13 NOTE — Telephone Encounter (Signed)
Patient informed. 

## 2016-12-28 ENCOUNTER — Encounter: Payer: Self-pay | Admitting: Licensed Clinical Social Worker

## 2016-12-28 ENCOUNTER — Encounter: Payer: Self-pay | Admitting: Family Medicine

## 2016-12-28 ENCOUNTER — Ambulatory Visit (INDEPENDENT_AMBULATORY_CARE_PROVIDER_SITE_OTHER): Payer: Medicare Other | Admitting: Family Medicine

## 2016-12-28 DIAGNOSIS — I10 Essential (primary) hypertension: Secondary | ICD-10-CM | POA: Diagnosis not present

## 2016-12-28 DIAGNOSIS — F329 Major depressive disorder, single episode, unspecified: Secondary | ICD-10-CM

## 2016-12-28 DIAGNOSIS — F32A Depression, unspecified: Secondary | ICD-10-CM

## 2016-12-28 DIAGNOSIS — S86902S Unspecified injury of unspecified muscle(s) and tendon(s) at lower leg level, left leg, sequela: Secondary | ICD-10-CM | POA: Diagnosis not present

## 2016-12-28 MED ORDER — FENTANYL 25 MCG/HR TD PT72: 25.0000 ug | MEDICATED_PATCH | TRANSDERMAL | 0 refills | Status: DC

## 2016-12-28 NOTE — Patient Instructions (Addendum)
Good to see you today!  Thanks for coming in.  I strongly recommend regular counseling and stop all alcohol  Come back in one month before you need refills  Bring all your medications
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
## 2016-12-28 NOTE — Assessment & Plan Note (Signed)
Controlled today 

## 2016-12-28 NOTE — Assessment & Plan Note (Signed)
Stable.  Using patches appropriately.  Gave one month supply since needs to come back for depression follow up

## 2016-12-28 NOTE — Assessment & Plan Note (Signed)
Worsened.  Concerned he is drinking again.  See IC note.  Strongly encouraged long term outpt counseling.  He will consider.  Will see frequently.  Denies any SI but is at risk with his fhx and comorbidites

## 2017-01-02 ENCOUNTER — Telehealth: Payer: Self-pay | Admitting: *Deleted

## 2017-01-02 NOTE — Telephone Encounter (Signed)
Patient called in stated he was only able to pick up 5 of the 10 fentanly patches. States he needs another prescription for the remaining 5. Please call patient when ready for pick up at [phone]. Hubbard Hartshorn, RN, BSN

## 2017-01-05 ENCOUNTER — Telehealth: Payer: Self-pay | Admitting: Licensed Clinical Social Worker

## 2017-01-05 MED ORDER — FENTANYL 25 MCG/HR TD PT72: 25.0000 ug | MEDICATED_PATCH | TRANSDERMAL | 0 refills | Status: DC

## 2017-01-05 NOTE — Progress Notes (Signed)
Integrated Care f/u phone call to patient reference resources provided during joint visit with PCP. Patient indicated he has not made contact with [facility], his truck is not working and he has no transportation at this time.   Intervention: , Reflective listening and Supportive Counseling    Plan:  1. Patient indicated he will see LCSW when he returns for f/u visit with PCP.   2. Patient  would like to revisit IOP as a treatment option at that time.  Casimer Lanius, LCSW Licensed Clinical Social Worker [facility]   [phone] 2:21 PM

## 2017-01-05 NOTE — Telephone Encounter (Signed)
Pt informed. Andre Sherman, CMA  

## 2017-01-05 NOTE — Telephone Encounter (Signed)
Printed Rx for 5 patches and put in pick up box  Please notify patient

## 2017-01-18 ENCOUNTER — Other Ambulatory Visit: Payer: Self-pay

## 2017-01-18 ENCOUNTER — Ambulatory Visit (INDEPENDENT_AMBULATORY_CARE_PROVIDER_SITE_OTHER): Payer: Medicare Other | Admitting: Family Medicine

## 2017-01-18 ENCOUNTER — Encounter: Payer: Self-pay | Admitting: Licensed Clinical Social Worker

## 2017-01-18 ENCOUNTER — Encounter: Payer: Self-pay | Admitting: Family Medicine

## 2017-01-18 DIAGNOSIS — F329 Major depressive disorder, single episode, unspecified: Secondary | ICD-10-CM | POA: Diagnosis not present

## 2017-01-18 DIAGNOSIS — F32A Depression, unspecified: Secondary | ICD-10-CM

## 2017-01-18 DIAGNOSIS — S86902S Unspecified injury of unspecified muscle(s) and tendon(s) at lower leg level, left leg, sequela: Secondary | ICD-10-CM | POA: Diagnosis not present

## 2017-01-18 MED ORDER — FENTANYL 25 MCG/HR TD PT72: 25.0000 ug | MEDICATED_PATCH | TRANSDERMAL | 0 refills | Status: DC

## 2017-01-18 NOTE — Progress Notes (Signed)
Type of Service: Integrated Behavioral Health Warm Handoff Total time:15 minutes :  Interpretor:No.    SUBJECTIVE: Andre Sherman is a 61 y.o. male  referred by Dr. Erin Hearing to provided patient with additional substance treatment resources.      Reason for follow-up: Continue brief intervention to assist patient with managing symptoms of depression and excessive alcohol consumption, as well as family stressors. Reports he needs to stop drinking and wants to attend AA meeting.  Patient lost information that was previously provided during last office visit.   GOALS: Patient will reduce symptoms of: depression and stress , and increase  ability YD:XAJOINO habits and stress reduction, Increase adequate support systems for patient/family and Decrease self-medicating behaviors. Intervention: Motivational Interviewing, Reflective listening,, [employer]   ASSESSMENT:Patient is less talkative and engaged today, mostly holds his head down not making eye contact, he continues alcohol consumption and difficulty with managing family stressors.  Patient may benefit from, and is in agreement to contact [facility] and AA for assist with managing his symptoms.  PLAN: Patient will F/U with LCSW in two weeks when he returns to the office, he declined a F/U call.  Warm Hand Off Completed.     Casimer Lanius, LCSW Licensed Clinical Social Worker [place]   [phone] 12:22 PM

## 2017-01-18 NOTE — Progress Notes (Signed)
Subjective  Patient is presenting with the following illnesses  ALCOHOL Continues to drink about a 40 at night.  Feels worse in the am when he does.  Has drank on and off for years.  Would like to stop and knows is bad for him.  Mentioned AA as something he might be interested in. No suicidal ideation but does not have a lot of hope for the future  CHRONIC LEG PAIN About the same.  Patch not working quite as well as did at first but acknowledges this could be due to his mood. No redness or skin break down    Chief Complaint noted Review of Symptoms - see HPI PMH - Smoking status noted.     Objective Vital Signs reviewed Psych:  Cognition and judgment appear intact. Alert, communicative  and cooperative with normal attention span and concentration. No apparent delusions, illusions, hallucinations He became mildly agitated when told I would like to see him back before would give more than 5 patches.  He thought this was unfair and that he had transportation problems.  I explained I was concerned about his mood and that he was at high risk for worsening.  He eventually agreed     Assessments/Plans  Depression Worsened and complicated by alcoholism.  Explained needed to see him back frequently and he agreed to contact us if worsening.   Was to meet with IC for resources for alcohol cessation   Sequelae of injury of muscle and tendon of lower limb Will continue current regimen but may need to adjust if he continues drinking alcohol.  Discussed this with him    See after visit summary for details of patient instuctions

## 2017-01-18 NOTE — Patient Instructions (Signed)
Good to see you today!  Thanks for coming in.  See Andre Sherman today   Call if you are feeling worse  Come back to see me or Andre Sherman in 2 weeks.  You can pick up your Rx then  Stopping all alcohol is your most important task

## 2017-01-18 NOTE — Assessment & Plan Note (Signed)
Worsened and complicated by alcoholism.  Explained needed to see him back frequently and he agreed to contact us if worsening.   Was to meet with IC for resources for alcohol cessation

## 2017-01-18 NOTE — Assessment & Plan Note (Signed)
Will continue current regimen but may need to adjust if he continues drinking alcohol.  Discussed this with him

## 2017-02-09 ENCOUNTER — Other Ambulatory Visit: Payer: Self-pay | Admitting: Family Medicine

## 2017-02-15 ENCOUNTER — Encounter: Payer: Self-pay | Admitting: Family Medicine

## 2017-02-15 ENCOUNTER — Other Ambulatory Visit: Payer: Self-pay

## 2017-02-15 ENCOUNTER — Ambulatory Visit (INDEPENDENT_AMBULATORY_CARE_PROVIDER_SITE_OTHER): Payer: Medicare Other | Admitting: Family Medicine

## 2017-02-15 DIAGNOSIS — F329 Major depressive disorder, single episode, unspecified: Secondary | ICD-10-CM | POA: Diagnosis not present

## 2017-02-15 DIAGNOSIS — S86902S Unspecified injury of unspecified muscle(s) and tendon(s) at lower leg level, left leg, sequela: Secondary | ICD-10-CM | POA: Diagnosis not present

## 2017-02-15 DIAGNOSIS — F32A Depression, unspecified: Secondary | ICD-10-CM

## 2017-02-15 MED ORDER — FENTANYL 25 MCG/HR TD PT72: 25.0000 ug | MEDICATED_PATCH | TRANSDERMAL | 0 refills | Status: DC

## 2017-02-15 NOTE — Assessment & Plan Note (Signed)
Improving some with taking care of his 3 you grand daughter.  His son gets out of prison in mid Jan

## 2017-02-15 NOTE — Progress Notes (Signed)
Subjective  Patient is presenting with the following illnesses  DEPRESSION Disease Monitoring Current symptoms include anhedonia, depressed mood and fatigue            Symptoms have been gradually improving     Is Exercising no  Evidence of suicidal ideation: no   Is helping take care of his 61 yo grand daughter which really makes him feel better  Medication Monitoring Compliance: taking as prescribed Decreased Libido no      Lightheadedness no     Insomnia unchanged  GI symptoms no  ROS - See HPI  LEG PAIN About the same. Patch helps him walk and sleep.  No sore or fever or increased swelling   Chief Complaint noted Review of Symptoms - see HPI PMH - Smoking status noted.     Objective Vital Signs reviewed Psych:  Cognition and judgment appear intact. Alert, communicative  and cooperative with normal attention span and concentration. No apparent delusions, illusions, hallucinations     Assessments/Plans  Sequelae of injury of muscle and tendon of lower limb Stable.  Wrote Rx for 2 months in 2 weeks increments   Depression Improving some with taking care of his 3 you grand daughter.  His son gets out of prison in mid Jan    See after visit summary for details of patient instuctions

## 2017-02-15 NOTE — Assessment & Plan Note (Signed)
Stable.  Wrote Rx for 2 months in 2 weeks increments

## 2017-02-15 NOTE — Patient Instructions (Signed)
Andre Sherman  Glad you are feeling better  Call me if you are not  Come back in 2 months for refills and to talk about smoking!

## 2017-04-23 ENCOUNTER — Ambulatory Visit (HOSPITAL_COMMUNITY)
Admission: EM | Admit: 2017-04-23 | Discharge: 2017-04-23 | Disposition: A | Discharge status: Home / Self Care | Payer: Medicare Other | Attending: Family Medicine | Admitting: Family Medicine

## 2017-04-23 ENCOUNTER — Ambulatory Visit (INDEPENDENT_AMBULATORY_CARE_PROVIDER_SITE_OTHER): Payer: Medicare Other

## 2017-04-23 ENCOUNTER — Other Ambulatory Visit: Payer: Self-pay

## 2017-04-23 DIAGNOSIS — M79671 Pain in right foot: Secondary | ICD-10-CM

## 2017-04-23 DIAGNOSIS — M7989 Other specified soft tissue disorders: Secondary | ICD-10-CM | POA: Diagnosis not present

## 2017-04-23 DIAGNOSIS — M109 Gout, unspecified: Secondary | ICD-10-CM

## 2017-04-23 MED ORDER — COLCHICINE 0.6 MG PO TABS: ORAL_TABLET | ORAL | 0 refills | Status: DC

## 2017-04-23 MED ORDER — IBUPROFEN 800 MG PO TABS
ORAL_TABLET | ORAL | Status: AC
  Filled 2017-04-23: qty 1

## 2017-04-23 MED ORDER — DICLOFENAC SODIUM 75 MG PO TBEC: 75.0000 mg | DELAYED_RELEASE_TABLET | Freq: Two times a day (BID) | ORAL | 0 refills | Status: DC | PRN

## 2017-04-23 MED ORDER — IBUPROFEN 800 MG PO TABS
800.0000 mg | ORAL_TABLET | Freq: Once | ORAL | Status: AC
  Administered 2017-04-23: 800 mg via ORAL

## 2017-04-23 NOTE — ED Triage Notes (Addendum)
Right leg pain and pt do not know what happened. Per pt he know he broke something but did not fall.
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
## 2017-04-23 NOTE — Discharge Instructions (Signed)
Your xray is negative today for broken bone or fracture. This appears to be consistent with gout. Please increase your water intake. Ice and elevation. Colchine, two tablets today followed by 1 tablet daily. Diclofenac twice a day for pain control. Activity as tolerated. Please follow up with your primary care provider and/or your podiatrist for recheck if no improvement of symptoms.

## 2017-05-02 ENCOUNTER — Other Ambulatory Visit: Payer: Self-pay

## 2017-05-02 ENCOUNTER — Ambulatory Visit (INDEPENDENT_AMBULATORY_CARE_PROVIDER_SITE_OTHER): Payer: Medicare Other | Admitting: Family Medicine

## 2017-05-02 ENCOUNTER — Encounter: Payer: Self-pay | Admitting: Family Medicine

## 2017-05-02 VITALS — BP 118/72 | HR 71 | Temp 97.8°F | Ht 70.0 in | Wt 237.0 lb

## 2017-05-02 DIAGNOSIS — Z720 Tobacco use: Secondary | ICD-10-CM | POA: Diagnosis not present

## 2017-05-02 DIAGNOSIS — Z114 Encounter for screening for human immunodeficiency virus [HIV]: Secondary | ICD-10-CM | POA: Diagnosis not present

## 2017-05-02 DIAGNOSIS — M1A0790 Idiopathic chronic gout, unspecified ankle and foot, without tophus (tophi): Secondary | ICD-10-CM | POA: Diagnosis not present

## 2017-05-02 DIAGNOSIS — S86902S Unspecified injury of unspecified muscle(s) and tendon(s) at lower leg level, left leg, sequela: Secondary | ICD-10-CM | POA: Diagnosis not present

## 2017-05-02 DIAGNOSIS — I10 Essential (primary) hypertension: Secondary | ICD-10-CM | POA: Diagnosis not present

## 2017-05-02 MED ORDER — FENTANYL 25 MCG/HR TD PT72: 25.0000 ug | MEDICATED_PATCH | TRANSDERMAL | 0 refills | Status: DC

## 2017-05-02 NOTE — Progress Notes (Signed)
Subjective  Patient is presenting with the following illnesses  LEG PAIN He feels pain most of the time.  Patches help some.  He is able to get out and help family and work on cars.  No fever or redness  GOUT Recently had flare and was started on allopurinol and colchicine.  Has had one other flare more than a year ago.  Does not wish to take medication regularly.  No other joint pain or rash  SMOKING Was able to stop for 2 days without a patch but started back with his gout flare.  Has a smokers cough and feels he can not exercise as well as before    Chief Complaint noted Review of Symptoms - see HPI PMH - Smoking status noted.     Objective Vital Signs reviewed Moving around room without difficulty     Assessments/Plans  Gout Stable - Decided to use as needed colchicine instead of regular allopurinol given his history of liver disease an many other medications   Sequelae of injury of muscle and tendon of lower limb Stable - He has pain and would like to increase his patch but we discussed this would not be good care and he agrees.     Tobacco abuse Stable.  He is committed to trying to stop again before next visit    See after visit summary for details of patient instuctions

## 2017-05-02 NOTE — Assessment & Plan Note (Signed)
Stable.  He is committed to trying to stop again before next visit

## 2017-05-02 NOTE — Assessment & Plan Note (Signed)
Stable - Decided to use as needed colchicine instead of regular allopurinol given his history of liver disease an many other medications

## 2017-05-02 NOTE — Assessment & Plan Note (Signed)
Stable - He has pain and would like to increase his patch but we discussed this would not be good care and he agrees.

## 2017-05-02 NOTE — Patient Instructions (Addendum)
Good to see you today!  Thanks for coming in.  If you have another Gout attack start the Colcrys immediately. Stop the allopurinol   Smoking  Good start   Try again!  Remember your cough  I will call you if your tests are not good.  Otherwise I will send you a letter.  If you do not hear from me with in 2 weeks please call our office.     Come back in 2 months  BRING ALL YOUR MEDICATIONS

## 2017-05-03 ENCOUNTER — Encounter: Payer: Self-pay | Admitting: Family Medicine

## 2017-05-03 LAB — CMP14+EGFR
ALK PHOS: 60 IU/L (ref 39–117)
ALT: 24 IU/L (ref 0–44)
AST: 26 IU/L (ref 0–40)
Albumin/Globulin Ratio: 1.3 (ref 1.2–2.2)
Albumin: 4.3 g/dL (ref 3.6–4.8)
BILIRUBIN TOTAL: 0.3 mg/dL (ref 0.0–1.2)
BUN/Creatinine Ratio: 11 (ref 10–24)
BUN: 22 mg/dL (ref 8–27)
CHLORIDE: 97 mmol/L (ref 96–106)
CO2: 24 mmol/L (ref 20–29)
CREATININE: 1.99 mg/dL — AB (ref 0.76–1.27)
Calcium: 10 mg/dL (ref 8.6–10.2)
GFR calc Af Amer: 41 mL/min/{1.73_m2} — ABNORMAL LOW (ref >59)
GFR calc non Af Amer: 35 mL/min/{1.73_m2} — ABNORMAL LOW (ref >59)
GLOBULIN, TOTAL: 3.3 g/dL (ref 1.5–4.5)
Glucose: 109 mg/dL — ABNORMAL HIGH (ref 65–99)
Potassium: 5.2 mmol/L (ref 3.5–5.2)
SODIUM: 138 mmol/L (ref 134–144)
Total Protein: 7.6 g/dL (ref 6.0–8.5)

## 2017-05-03 LAB — HIV ANTIBODY (ROUTINE TESTING W REFLEX): HIV SCREEN 4TH GENERATION: NONREACTIVE

## 2017-05-04 ENCOUNTER — Telehealth: Payer: Self-pay | Admitting: Family Medicine

## 2017-05-04 DIAGNOSIS — I10 Essential (primary) hypertension: Secondary | ICD-10-CM

## 2017-05-04 NOTE — Telephone Encounter (Signed)
Called home and discussed increased creatinine Recommendations as per letter  Your kidney function is worse on this lab test.  I would recommend -STOP your Diclofenac Voltaren pills  -Make sure you drink plenty of water -Avoid any over the counter pain medications like advil, nuprin, goody powders  -Come in for a blood test in one week   He agrees

## 2017-06-20 ENCOUNTER — Ambulatory Visit: Payer: Medicare Other | Admitting: Family Medicine

## 2017-06-22 ENCOUNTER — Other Ambulatory Visit: Payer: Self-pay | Admitting: Family Medicine

## 2017-06-27 ENCOUNTER — Encounter: Payer: Self-pay | Admitting: Family Medicine

## 2017-06-27 ENCOUNTER — Other Ambulatory Visit: Payer: Self-pay

## 2017-06-27 ENCOUNTER — Ambulatory Visit (INDEPENDENT_AMBULATORY_CARE_PROVIDER_SITE_OTHER): Payer: Medicare Other | Admitting: Family Medicine

## 2017-06-27 DIAGNOSIS — M1A0790 Idiopathic chronic gout, unspecified ankle and foot, without tophus (tophi): Secondary | ICD-10-CM | POA: Diagnosis not present

## 2017-06-27 DIAGNOSIS — N289 Disorder of kidney and ureter, unspecified: Secondary | ICD-10-CM | POA: Insufficient documentation

## 2017-06-27 DIAGNOSIS — I10 Essential (primary) hypertension: Secondary | ICD-10-CM

## 2017-06-27 DIAGNOSIS — S86902S Unspecified injury of unspecified muscle(s) and tendon(s) at lower leg level, left leg, sequela: Secondary | ICD-10-CM

## 2017-06-27 DIAGNOSIS — Z720 Tobacco use: Secondary | ICD-10-CM | POA: Diagnosis not present

## 2017-06-27 MED ORDER — FENTANYL 25 MCG/HR TD PT72: 25.0000 ug | MEDICATED_PATCH | TRANSDERMAL | 0 refills | Status: DC

## 2017-06-27 NOTE — Progress Notes (Signed)
Subjective  Andre Sherman is a 62 y.o. male is presenting with the following  RENAL INSUFFICIENCY Has not noticed any change in urine or frequency or edema.  Not taking any NSAIDS.  Is taking his usual antihtn medications.  No rashes or arthritis  CHRONIC LEG PAIN Stable.  He is walking some and is considering trying to bike again.  No redness or weakness  GOUT No recent attacks.  Is not taking allopurinol or colchicine.  No current pain or redness   Chief Complaint noted Review of Symptoms - see HPI PMH - Smoking status noted.    Objective Vital Signs reviewed BP 104/68   Pulse 91   Temp 98.3 F (36.8 C) (Oral)   Ht 5\' 10"  (1.778 m)   Wt 227 lb 6.4 oz (103.1 kg)   SpO2 95%   BMI 32.63 kg/m  Leg - has on lower compression hose Minimal edema    Assessments/Plans  See after visit summary for details of patient instuctions  Renal insufficiency On recent BMET.  Has stopped NSAIDS.  Will recheck   Gout Not controlled.  Restart daily allopurinol   Sequelae of injury of muscle and tendon of lower limb Taking fentanyl appropriately,  Seems to be keeping him active   Tobacco abuse Has been stopped for 3 days!

## 2017-06-27 NOTE — Assessment & Plan Note (Signed)
Not controlled.  Restart daily allopurinol

## 2017-06-27 NOTE — Patient Instructions (Signed)
Good to see you today!  Thanks for coming in.  For the Kidneys  I will call you with the result  Do not take any advil or nuprin or goody powders or Diclofenac  Take only tylenol  For Gout  Take allopurinol every day  Take colchine if you have a fllare  Come back in two months  Great work with the smoking

## 2017-06-27 NOTE — Assessment & Plan Note (Signed)
Taking fentanyl appropriately,  Seems to be keeping him active

## 2017-06-27 NOTE — Assessment & Plan Note (Signed)
Has been stopped for 3 days!

## 2017-06-27 NOTE — Assessment & Plan Note (Signed)
On recent BMET.  Has stopped NSAIDS.  Will recheck

## 2017-06-28 ENCOUNTER — Encounter: Payer: Self-pay | Admitting: Family Medicine

## 2017-06-28 LAB — BASIC METABOLIC PANEL
BUN / CREAT RATIO: 15 (ref 10–24)
BUN: 18 mg/dL (ref 8–27)
CO2: 26 mmol/L (ref 20–29)
Calcium: 9.4 mg/dL (ref 8.6–10.2)
Chloride: 96 mmol/L (ref 96–106)
Creatinine, Ser: 1.23 mg/dL (ref 0.76–1.27)
GFR, EST AFRICAN AMERICAN: 73 mL/min/{1.73_m2} (ref >59)
GFR, EST NON AFRICAN AMERICAN: 63 mL/min/{1.73_m2} (ref >59)
Glucose: 166 mg/dL — ABNORMAL HIGH (ref 65–99)
POTASSIUM: 5.3 mmol/L — AB (ref 3.5–5.2)
SODIUM: 137 mmol/L (ref 134–144)

## 2017-07-23 ENCOUNTER — Other Ambulatory Visit: Payer: Self-pay | Admitting: Family Medicine

## 2017-09-05 ENCOUNTER — Ambulatory Visit (INDEPENDENT_AMBULATORY_CARE_PROVIDER_SITE_OTHER): Payer: Medicare Other | Admitting: Family Medicine

## 2017-09-05 ENCOUNTER — Other Ambulatory Visit: Payer: Self-pay

## 2017-09-05 ENCOUNTER — Encounter: Payer: Self-pay | Admitting: Family Medicine

## 2017-09-05 VITALS — BP 102/62 | HR 96 | Temp 98.5°F | Ht 70.0 in | Wt 223.6 lb

## 2017-09-05 DIAGNOSIS — R739 Hyperglycemia, unspecified: Secondary | ICD-10-CM | POA: Diagnosis not present

## 2017-09-05 DIAGNOSIS — M6740 Ganglion, unspecified site: Secondary | ICD-10-CM | POA: Diagnosis not present

## 2017-09-05 DIAGNOSIS — S86902S Unspecified injury of unspecified muscle(s) and tendon(s) at lower leg level, left leg, sequela: Secondary | ICD-10-CM | POA: Diagnosis not present

## 2017-09-05 DIAGNOSIS — M1A0790 Idiopathic chronic gout, unspecified ankle and foot, without tophus (tophi): Secondary | ICD-10-CM

## 2017-09-05 DIAGNOSIS — Z720 Tobacco use: Secondary | ICD-10-CM

## 2017-09-05 LAB — POCT GLYCOSYLATED HEMOGLOBIN (HGB A1C): HEMOGLOBIN A1C: 5.7 % — AB (ref 4.0–5.6)

## 2017-09-05 MED ORDER — FENTANYL 25 MCG/HR TD PT72: 25.0000 ug | MEDICATED_PATCH | TRANSDERMAL | 0 refills | Status: DC

## 2017-09-05 MED ORDER — NICOTINE 14 MG/24HR TD PT24: 14.0000 mg | MEDICATED_PATCH | Freq: Every day | TRANSDERMAL | 0 refills | Status: DC

## 2017-09-05 NOTE — Assessment & Plan Note (Signed)
Good A1c.  Is losing weight by working on his diet

## 2017-09-05 NOTE — Assessment & Plan Note (Signed)
Stable Take allopurinol regularly

## 2017-09-05 NOTE — Assessment & Plan Note (Signed)
Stable.  Using fentanyl patches appropriately

## 2017-09-05 NOTE — Assessment & Plan Note (Signed)
Worsened.  He will try the 14 mg patch

## 2017-09-05 NOTE — Assessment & Plan Note (Signed)
New benign he does not want treatment

## 2017-09-05 NOTE — Patient Instructions (Addendum)
Good to see you today!  Thanks for coming in.  Congrats on the weight loss.  Try to stop smoking again.  Use the 14 mg patch  Take the Allopurinol 100 mg tab every day to prevent gout  You have a ganglion cyst   Come back in 2 months

## 2017-09-05 NOTE — Progress Notes (Signed)
Subjective  Andre Sherman is a 62 y.o. male is presenting with the following  LEG PAIN He feels pain most of the time.  Patches help some.  He is able to get out and help family and work on cars.  No fever or redness  GOUT No pain currently.  Not taking allopurinol (was not sure if needed to take all the time)  No other joint pain or rash  SMOKING Back smoking about 10 per day.  Never tried the patch.  Does wish to stop.  Was able for a week or so two months ago.  Not sure why started back  LUMP On R wrist - just came up.  No pain or limitations or other lumps or injury   Chief Complaint noted Review of Symptoms - see HPI PMH - Smoking status noted.    Objective Vital Signs reviewed BP 102/62   Pulse 96   Temp 98.5 F (36.9 C) (Oral)   Ht 5\' 10"  (1.778 m)   Wt 223 lb 9.6 oz (101.4 kg)   SpO2 91%   BMI 32.08 kg/m   R wrist - 2 cm smooth nonmobile nontender ganglion FROM   Assessments/Plans  See after visit summary for details of patient instuctions  Ganglion New benign he does not want treatment   Gout Stable Take allopurinol regularly  Hyperglycemia Good A1c.  Is losing weight by working on his diet   Sequelae of injury of muscle and tendon of lower limb Stable.  Using fentanyl patches appropriately   Tobacco abuse Worsened.  He will try the 14 mg patch

## 2017-10-02 ENCOUNTER — Telehealth: Payer: Self-pay

## 2017-10-02 NOTE — Telephone Encounter (Signed)
Received fax from [place] requesting prior authorization of Fentanyl patch. Clinical information submitted via CoverMyMeds. Status approved 09/02/2017-10/02/2018. [facility] notified. Danley Danker, RN [facility] RN)

## 2017-11-21 ENCOUNTER — Other Ambulatory Visit: Payer: Self-pay | Admitting: Family Medicine

## 2017-11-21 ENCOUNTER — Encounter: Payer: Self-pay | Admitting: Family Medicine

## 2017-11-21 ENCOUNTER — Ambulatory Visit (INDEPENDENT_AMBULATORY_CARE_PROVIDER_SITE_OTHER): Payer: Medicare Other | Admitting: Family Medicine

## 2017-11-21 DIAGNOSIS — F32A Depression, unspecified: Secondary | ICD-10-CM

## 2017-11-21 DIAGNOSIS — S86902S Unspecified injury of unspecified muscle(s) and tendon(s) at lower leg level, left leg, sequela: Secondary | ICD-10-CM

## 2017-11-21 DIAGNOSIS — M1A0790 Idiopathic chronic gout, unspecified ankle and foot, without tophus (tophi): Secondary | ICD-10-CM

## 2017-11-21 DIAGNOSIS — F329 Major depressive disorder, single episode, unspecified: Secondary | ICD-10-CM | POA: Diagnosis not present

## 2017-11-21 DIAGNOSIS — Z23 Encounter for immunization: Secondary | ICD-10-CM | POA: Diagnosis not present

## 2017-11-21 MED ORDER — FENTANYL 25 MCG/HR TD PT72: 25.0000 ug | MEDICATED_PATCH | TRANSDERMAL | 0 refills | Status: DC

## 2017-11-21 NOTE — Assessment & Plan Note (Signed)
Stable continue fentanyl patch

## 2017-11-21 NOTE — Assessment & Plan Note (Signed)
Stable  Went over medications See after visit summary

## 2017-11-21 NOTE — Patient Instructions (Addendum)
Good to see you today!  Thanks for coming in.  For the Gout Take the Allopurinol every day Use the Colchicine as needed if you have an attack  Try 1 1/2 amitriptyline at night to see if bothers you  Continue to wok on smoking   Come back before you need refills

## 2017-11-21 NOTE — Assessment & Plan Note (Signed)
Doing well on current medications.  Discussed trying to decrease amitriptyline

## 2017-11-21 NOTE — Progress Notes (Signed)
LEG PAIN He feels pain most of the time.  Patches help him keep moving.  He is able to get out and help family and work on cars.  No fever or redness  GOUT No pain currently.  Not taking allopurinol or allopurinol  SMOKING Back smoking about 10 per day.  Still never tried the patch but does have at home.  Does wish to stop.  Was able for a week or so two months ago.  Hopes his wife can stop with him  LUMP On R wrist - just came up.  No pain or limitations or other lumps or injury Subjective   Chief Complaint noted Review of Symptoms - see HPI PMH - Smoking status noted.    Objective Vital Signs reviewed BP 120/76   Pulse 82   Temp 98.3 F (36.8 C) (Oral)   Ht 5\' 10"  (1.778 m)   Wt 220 lb 12.8 oz (100.2 kg)   SpO2 90%   BMI 31.68 kg/m   Walks with limp but no cane Psych:  Cognition and judgment appear intact. Alert, communicative  and cooperative with normal attention span and concentration. No apparent delusions, illusions, hallucinations   Assessments/Plans  See after visit summary for details of patient instuctions  Sequelae of injury of muscle and tendon of lower limb Stable continue fentanyl patch   Gout Stable  Went over medications See after visit summary   Depression Doing well on current medications.  Discussed trying to decrease amitriptyline

## 2018-01-17 ENCOUNTER — Ambulatory Visit: Payer: Medicare Other | Admitting: Family Medicine

## 2018-01-18 ENCOUNTER — Ambulatory Visit (INDEPENDENT_AMBULATORY_CARE_PROVIDER_SITE_OTHER): Payer: Medicare Other | Admitting: Family Medicine

## 2018-01-18 VITALS — BP 130/80 | HR 79 | Temp 98.3°F | Wt 224.0 lb

## 2018-01-18 DIAGNOSIS — I10 Essential (primary) hypertension: Secondary | ICD-10-CM

## 2018-01-18 DIAGNOSIS — Z13220 Encounter for screening for lipoid disorders: Secondary | ICD-10-CM

## 2018-01-18 DIAGNOSIS — F329 Major depressive disorder, single episode, unspecified: Secondary | ICD-10-CM | POA: Diagnosis not present

## 2018-01-18 DIAGNOSIS — S86902S Unspecified injury of unspecified muscle(s) and tendon(s) at lower leg level, left leg, sequela: Secondary | ICD-10-CM | POA: Diagnosis not present

## 2018-01-18 DIAGNOSIS — F32A Depression, unspecified: Secondary | ICD-10-CM

## 2018-01-18 MED ORDER — FENTANYL 25 MCG/HR TD PT72: 25.0000 ug | MEDICATED_PATCH | TRANSDERMAL | 0 refills | Status: DC

## 2018-01-18 NOTE — Progress Notes (Signed)
Subjective  Andre Sherman is a 62 y.o. male is presenting with the following  LEG PAIN He feels pain most of the time.  Patches help him keep moving.  Has new compression stocking that seem to help.   He is able to get out and help family and cook.  No fever or redness  HYPERTENSION Disease Monitoring  Home BP Monitoring (Severity) not checking Symptoms - Chest pain- no    Dyspnea- no Medications (Modifying factors) Compliance-  Daily - knows his med. Lightheadedness-  no  Edema- none new Timing - continuous  Duration - years ROS - See HPI  PMH Lab Review   Potassium  Date Value Ref Range Status  01/18/2018 4.8 3.5 - 5.2 mmol/L Final   Sodium  Date Value Ref Range Status  01/18/2018 131 (L) 134 - 144 mmol/L Final   Creat  Date Value Ref Range Status  06/03/2015 1.04 0.70 - 1.33 mg/dL Final   Creatinine, Ser  Date Value Ref Range Status  01/18/2018 1.09 0.76 - 1.27 mg/dL Final   DEPRESSION Feels pretty well.  Sleeping is not great but improved to 6 months ago.  Is active at home and trying to exercise.  His son is doing better.  No suicidal ideation      SMOKING Back smoking about 10 per day.  Tried patch stopped for a few days but went back Does wish to stop.  He does not know what smoking does for him   Chief Complaint noted Review of Symptoms - see HPI PMH - Smoking status noted.    Objective Vital Signs reviewed BP 130/80   Pulse 79   Temp 98.3 F (36.8 C) (Oral)   Wt 224 lb (101.6 kg)   SpO2 92%   BMI 32.14 kg/m  Psych:  Cognition and judgment appear intact. Alert, communicative  and cooperative with normal attention span and concentration. No apparent delusions, illusions, hallucinations  Assessments/Plans  See after visit summary for details of patient instuctions  Essential hypertension Controlled check labs  Depression Stable He does not want to decrease medications at this time   Sequelae of injury of muscle and tendon of lower limb Using  fentanyl patch appropriately.  Enables him to keep active.    [place] Database reviewed

## 2018-01-18 NOTE — Patient Instructions (Signed)
Good to see you today!  Thanks for coming in.  I will call you if your tests are not good.  Otherwise I will send you a letter.  If you do not hear from me with in 2 weeks please call our office.     Think about what smoking does for you?  What are the positives  Be careful with weight gain  Come back in 3 months before your next refill  Happy Holidays

## 2018-01-18 NOTE — Assessment & Plan Note (Signed)
Using fentanyl patch appropriately.  Enables him to keep active.    [place] Database reviewed

## 2018-01-18 NOTE — Assessment & Plan Note (Signed)
Controlled check labs

## 2018-01-18 NOTE — Assessment & Plan Note (Signed)
Stable He does not want to decrease medications at this time

## 2018-01-19 ENCOUNTER — Telehealth: Payer: Self-pay | Admitting: Family Medicine

## 2018-01-19 ENCOUNTER — Encounter: Payer: Self-pay | Admitting: Family Medicine

## 2018-01-19 DIAGNOSIS — E785 Hyperlipidemia, unspecified: Secondary | ICD-10-CM | POA: Insufficient documentation

## 2018-01-19 LAB — CMP14+EGFR
ALBUMIN: 4.3 g/dL (ref 3.6–4.8)
ALK PHOS: 68 IU/L (ref 39–117)
ALT: 15 IU/L (ref 0–44)
AST: 16 IU/L (ref 0–40)
Albumin/Globulin Ratio: 1.6 (ref 1.2–2.2)
BUN / CREAT RATIO: 13 (ref 10–24)
BUN: 14 mg/dL (ref 8–27)
Bilirubin Total: 0.3 mg/dL (ref 0.0–1.2)
CO2: 24 mmol/L (ref 20–29)
CREATININE: 1.09 mg/dL (ref 0.76–1.27)
Calcium: 9.8 mg/dL (ref 8.6–10.2)
Chloride: 92 mmol/L — ABNORMAL LOW (ref 96–106)
GFR calc non Af Amer: 72 mL/min/{1.73_m2} (ref >59)
GFR, EST AFRICAN AMERICAN: 84 mL/min/{1.73_m2} (ref >59)
GLUCOSE: 119 mg/dL — AB (ref 65–99)
Globulin, Total: 2.7 g/dL (ref 1.5–4.5)
Potassium: 4.8 mmol/L (ref 3.5–5.2)
Sodium: 131 mmol/L — ABNORMAL LOW (ref 134–144)
TOTAL PROTEIN: 7 g/dL (ref 6.0–8.5)

## 2018-01-19 LAB — LIPID PANEL
CHOLESTEROL TOTAL: 202 mg/dL — AB (ref 100–199)
Chol/HDL Ratio: 4.8 ratio (ref 0.0–5.0)
HDL: 42 mg/dL (ref >39)
LDL Calculated: 139 mg/dL — ABNORMAL HIGH (ref 0–99)
TRIGLYCERIDES: 107 mg/dL (ref 0–149)
VLDL Cholesterol Cal: 21 mg/dL (ref 5–40)

## 2018-01-19 LAB — CBC
HEMATOCRIT: 42.3 % (ref 37.5–51.0)
HEMOGLOBIN: 15 g/dL (ref 13.0–17.7)
MCH: 31.4 pg (ref 26.6–33.0)
MCHC: 35.5 g/dL (ref 31.5–35.7)
MCV: 89 fL (ref 79–97)
Platelets: 294 10*3/uL (ref 150–450)
RBC: 4.78 x10E6/uL (ref 4.14–5.80)
RDW: 13 % (ref 12.3–15.4)
WBC: 8.8 10*3/uL (ref 3.4–10.8)

## 2018-01-19 MED ORDER — ATORVASTATIN CALCIUM 40 MG PO TABS: 40.0000 mg | ORAL_TABLET | Freq: Every day | ORAL | 3 refills | Status: DC

## 2018-01-19 NOTE — Assessment & Plan Note (Signed)
Given risk factors will start lipitor.

## 2018-01-19 NOTE — Telephone Encounter (Signed)
Recommend take lipitor 40 mg daily  He agrees  To let me know if any problems taking it

## 2018-02-12 ENCOUNTER — Encounter: Payer: Self-pay | Admitting: Podiatry

## 2018-02-12 ENCOUNTER — Ambulatory Visit (INDEPENDENT_AMBULATORY_CARE_PROVIDER_SITE_OTHER): Payer: Medicare Other

## 2018-02-12 ENCOUNTER — Ambulatory Visit (INDEPENDENT_AMBULATORY_CARE_PROVIDER_SITE_OTHER): Payer: Medicare Other | Admitting: Podiatry

## 2018-02-12 ENCOUNTER — Other Ambulatory Visit: Payer: Self-pay | Admitting: Podiatry

## 2018-02-12 DIAGNOSIS — B351 Tinea unguium: Secondary | ICD-10-CM

## 2018-02-12 DIAGNOSIS — M778 Other enthesopathies, not elsewhere classified: Secondary | ICD-10-CM

## 2018-02-12 DIAGNOSIS — M779 Enthesopathy, unspecified: Principal | ICD-10-CM

## 2018-02-12 DIAGNOSIS — M79675 Pain in left toe(s): Secondary | ICD-10-CM

## 2018-02-12 DIAGNOSIS — M79672 Pain in left foot: Secondary | ICD-10-CM

## 2018-02-14 NOTE — Progress Notes (Signed)
Subjective:   Patient ID: Andre Sherman, male   DOB: 62 y.o.   MRN: [id number]   HPI Patient presents with exquisite discomfort underneath the left fifth MPJ with fluid buildup noted and also has a severely thickened third nail left that is dystrophic very painful and he cannot cut himself   ROS      Objective:  Physical Exam  Neurovascular status intact with patient found to have inflammation fluid around the fifth MPJ left and severely dystrophic thickened third nail left     Assessment:  Thickened nail deformity third left with dystrophic changes and inflammatory capsulitis fifth MPJ left     Plan:  Reviewed chronic mycotic nail condition with considerations long-term for excision and removal of the nail but at this point sharp sterile debridement accomplished with no iatrogenic bleeding and injected the capsule 3 mg Dexasone Kenalog 5 mg Xylocaine debrided lesion and recommended continued conservative care.  Reappoint to recheck

## 2018-03-02 ENCOUNTER — Other Ambulatory Visit: Payer: Self-pay | Admitting: Family Medicine

## 2018-04-23 IMAGING — DX DG CHEST 2V
2 series · 2 of 2 positions shown · non-contrast
Comparison: 06/23/2016

CLINICAL DATA: Cough for several days

EXAM:
CHEST  2 VIEW

[chest pa]
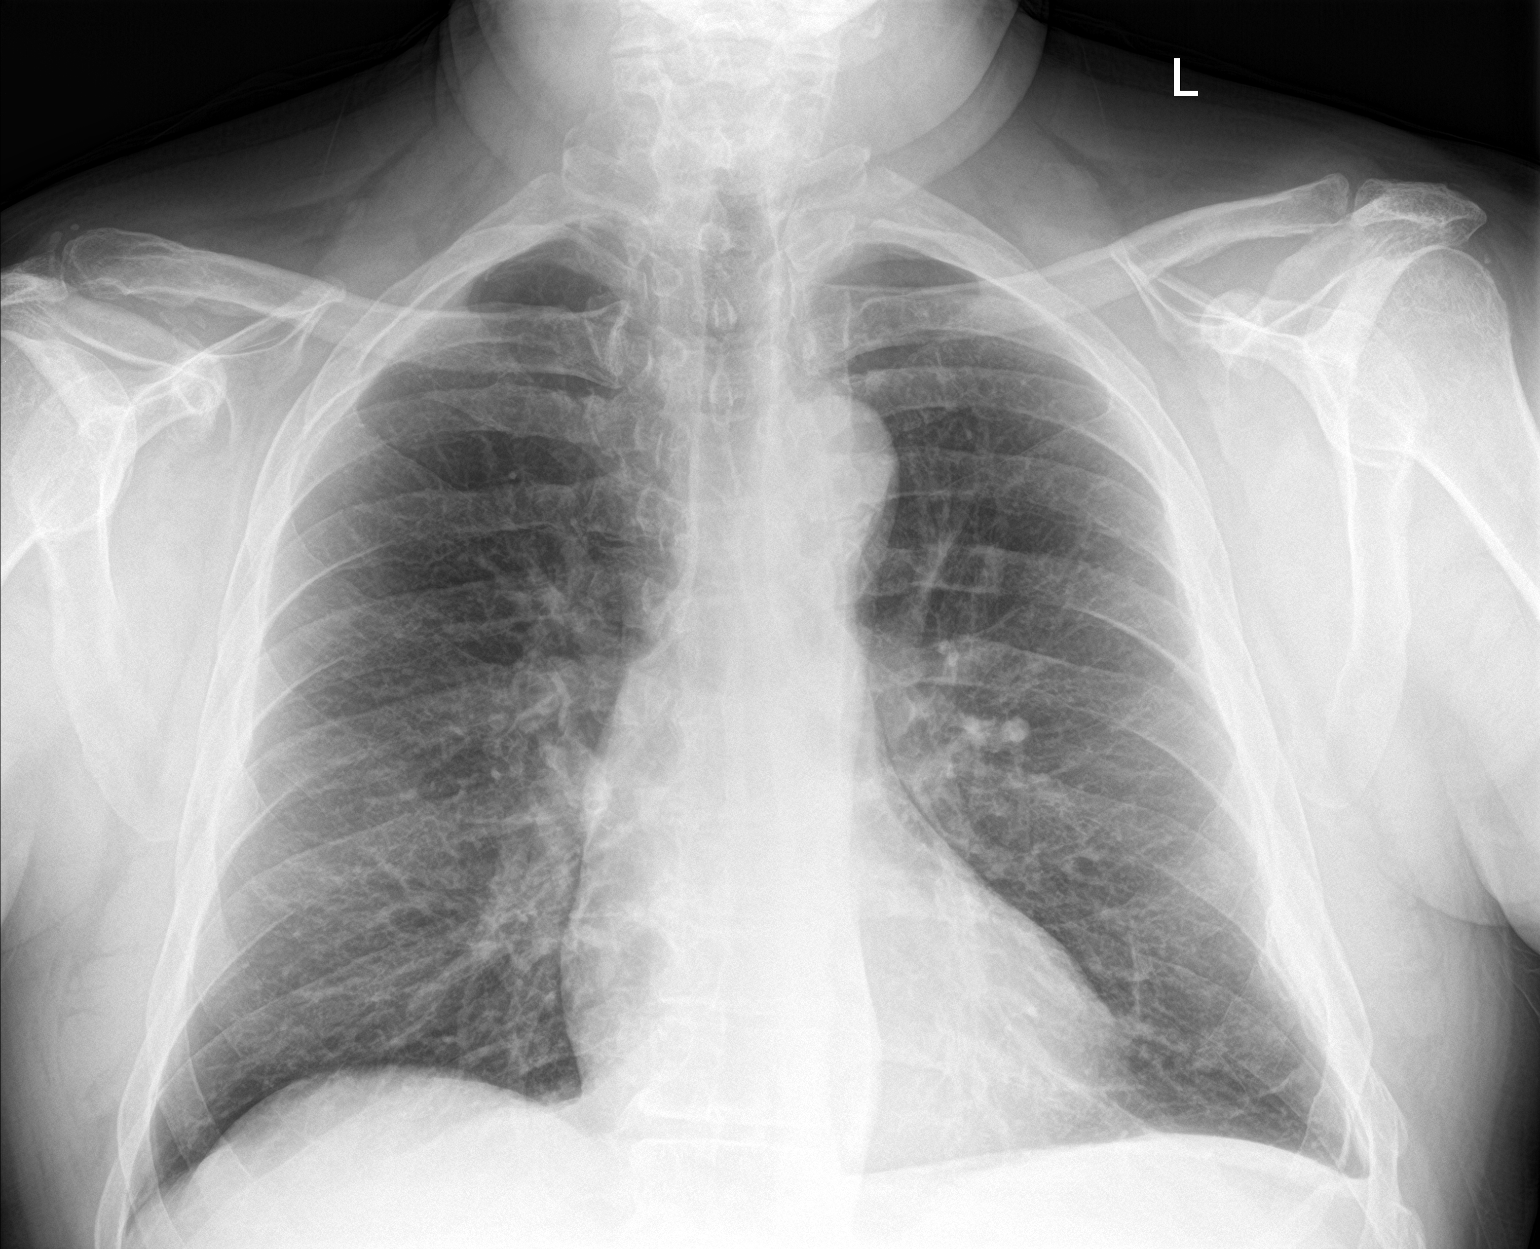

[chest lat]
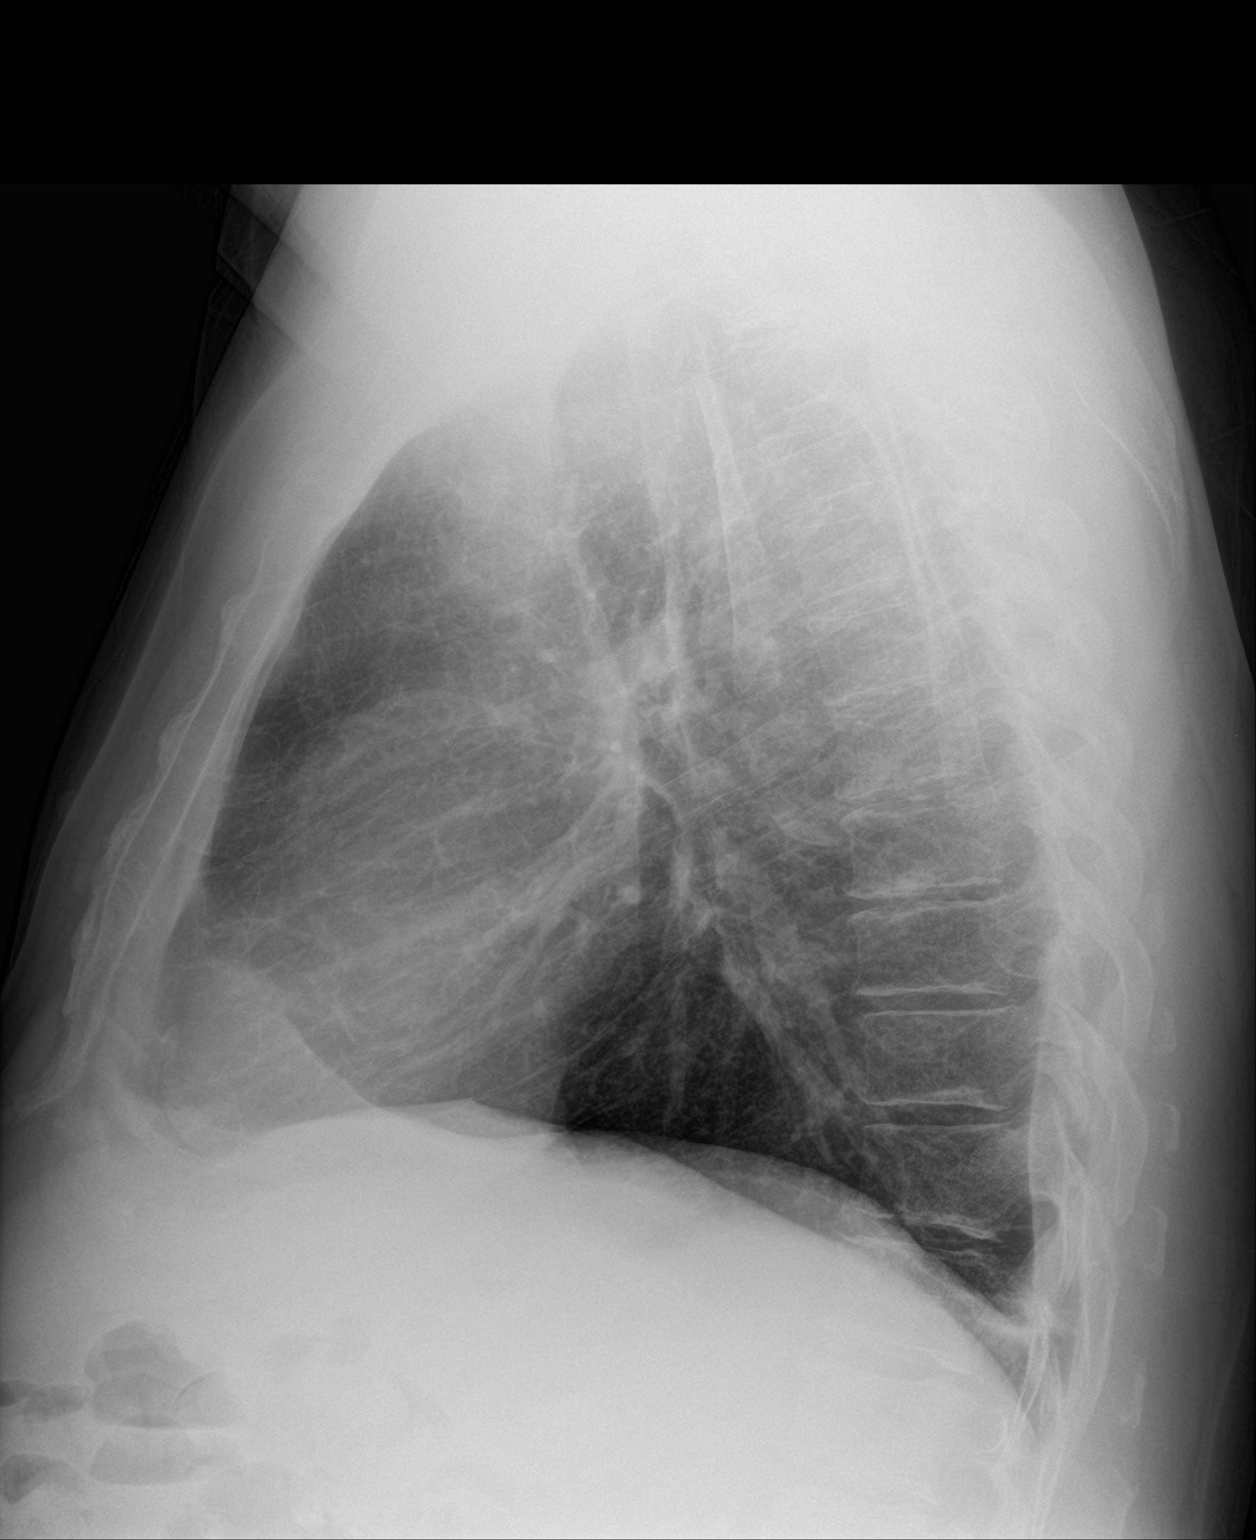

[2 of 2 positions shown; findings below may reference images not displayed]

FINDINGS: Cardiac shadow is within normal limits. Lungs are mildly
hyperinflated. No focal infiltrate or sizable effusion is seen. No
acute bony abnormality is seen.
IMPRESSION: No active cardiopulmonary disease.

## 2018-05-14 ENCOUNTER — Other Ambulatory Visit: Payer: Self-pay

## 2018-05-14 ENCOUNTER — Encounter: Payer: Self-pay | Admitting: Family Medicine

## 2018-05-14 ENCOUNTER — Ambulatory Visit (INDEPENDENT_AMBULATORY_CARE_PROVIDER_SITE_OTHER): Payer: Medicare Other | Admitting: Family Medicine

## 2018-05-14 DIAGNOSIS — E785 Hyperlipidemia, unspecified: Secondary | ICD-10-CM

## 2018-05-14 DIAGNOSIS — S86902S Unspecified injury of unspecified muscle(s) and tendon(s) at lower leg level, left leg, sequela: Secondary | ICD-10-CM

## 2018-05-14 MED ORDER — FENTANYL 25 MCG/HR TD PT72: 1.0000 | MEDICATED_PATCH | TRANSDERMAL | 0 refills | Status: DC

## 2018-05-14 MED ORDER — ALBUTEROL SULFATE HFA 108 (90 BASE) MCG/ACT IN AERS: 1.0000 | INHALATION_SPRAY | Freq: Four times a day (QID) | RESPIRATORY_TRACT | 0 refills | Status: DC | PRN

## 2018-05-14 MED ORDER — ALLOPURINOL 100 MG PO TABS: 100.0000 mg | ORAL_TABLET | Freq: Every day | ORAL | 1 refills | Status: DC

## 2018-05-14 NOTE — Assessment & Plan Note (Signed)
Continued chronic pain. Patches refilled.  [place] database checked and appropriate

## 2018-05-14 NOTE — Patient Instructions (Addendum)
Good to see you today!  Thanks for coming in.  Try Aspercreme if that is not helping the try Capsacin cream  I will call you if your lab tests are not normal.  Otherwise we will discuss them at your next visit.  Come back and see me May early for your recertification  Follow your advice on smoking

## 2018-05-14 NOTE — Assessment & Plan Note (Signed)
Tolerating statin.  Check Labs

## 2018-05-14 NOTE — Progress Notes (Signed)
Subjective  Andre Sherman is a 63 y.o. male is presenting with the following  CHRONIC LOWER EXTREMITY PAIN About the same.  Patches alow him to keep active.  Some days are worse than others but he is happy with how things are.  Using gabapentin and Elavil as adjuvants  HYPERLIPIDEMIA Symptoms Chest pain on exertion:  no   Leg claudication:   no Medications (modifying factor): Compliance- no problems taking lipitor started 2 months ago Right upper quadrant pain- no  Muscle aches- no  Duration - years   Timing - continuous    Component Value Date/Time   CHOL 202 (H) 01/18/2018 1103   TRIG 107 01/18/2018 1103   HDL 42 01/18/2018 1103   VLDL 17 01/07/2011 0300   CHOLHDL 4.8 01/18/2018 1103   CHOLHDL 4.2 01/07/2011 0300    Chief Complaint noted Review of Symptoms - see HPI PMH - Smoking status noted.    Objective Vital Signs reviewed BP (!) 142/80   Pulse 89   Temp 98.5 F (36.9 C) (Oral)   Ht 5\' 10"  (1.778 m)   Wt 217 lb 8 oz (98.7 kg)   SpO2 94%   BMI 31.21 kg/m   Assessments/Plans  See after visit summary for details of patient instuctions  Hyperlipidemia Tolerating statin.  Check Labs  Sequelae of injury of muscle and tendon of lower limb Continued chronic pain. Patches refilled.  [place] database checked and appropriate

## 2018-05-15 LAB — LDL CHOLESTEROL, DIRECT: LDL Direct: 94 mg/dL (ref 0–99)

## 2018-05-22 ENCOUNTER — Telehealth (INDEPENDENT_AMBULATORY_CARE_PROVIDER_SITE_OTHER): Payer: Medicare Other | Admitting: Family Medicine

## 2018-05-22 ENCOUNTER — Other Ambulatory Visit: Payer: Self-pay

## 2018-05-22 DIAGNOSIS — J069 Acute upper respiratory infection, unspecified: Secondary | ICD-10-CM | POA: Diagnosis not present

## 2018-05-22 DIAGNOSIS — B9789 Other viral agents as the cause of diseases classified elsewhere: Secondary | ICD-10-CM | POA: Diagnosis not present

## 2018-05-22 NOTE — Assessment & Plan Note (Signed)
Patient is very low risk for COVID-19 due to lack of exposure and fevers.  He does not currently found to be in any distress on the phone and does not have shortness of breath while speaking.  He is able to speak in complete sentences and does not have shortness of breath at rest.  This was communicated to the patient.  He is not currently requiring emergency care.  Recommended to the patient that he continue to self quarantine at home.  Return precautions given including worsening shortness of breath making it difficult to speak in full sentences, or lips or fingers turning blue.  Patient voiced understanding of this.  He was also instructed on where to find the emergency tent for COVID testing at [facility].

## 2018-05-22 NOTE — Progress Notes (Signed)
[place] Telemedicine Visit  Patient consented to have visit conducted via telephone.  Encounter participants: Patient: Andre Sherman  Provider: Bernita Raisin Meccariello  Others (if applicable): Dr. Gwendlyn Deutscher  Chief Complaint: cough and body aches  HPI: Mr. Rhodes is a 63 year old male with a history of hypertension and hyperlipidemia who calls in today with complaint of cough for 9 days.  He states that he is a current smoker and has a chronic cough with clear mucus, but since he was seen in the clinic on 3/16, he has been having increased cough with dark brown mucus.  He states that the cough is keeping him up at night.  Since he was seen in the clinic on 3/16, he has also started to have a sore throat and what he describes as shortness of breath.  He states that he "feels winded if he walks around too much."  He denies shortness of breath at rest and denies shortness of breath with all walking.  He states that he is able to walk and talk without difficulty.  Denies that his lips or fingers have turned blue.  He has not been smoking due to not feeling well.  He states that he has also developed the body aches over the last week and states "I feel like when I have the flu."  He has not had any fevers.  He has not had any chest pain.  He has been able to eat and drink an adequate amount per his report.  He denies any sick contacts.  He states that he has only been out of his home on 3/16 to come to his office visit, otherwise he has been in the house.  He states that his wife is not currently working and he has 2 sons who live in the home but they are also not currently working.  He states that they have been in the home for over a week.  He is calling today because he is concerned that he might have COVID-19.  Of note, patient is able to speak in complete sentences while on the phone and does not sound short of breath.  He does not appear to be in any acute distress.  ROS:  As noted in HPI  Pertinent PMHx: Hypertension, hyperlipidemia, no underlying lung problems although is a current smoker, no known heart disease  Assessment/Plan:  Viral URI with cough Patient is very low risk for COVID-19 due to lack of exposure and fevers.  He does not currently found to be in any distress on the phone and does not have shortness of breath while speaking.  He is able to speak in complete sentences and does not have shortness of breath at rest.  This was communicated to the patient.  He is not currently requiring emergency care.  Recommended to the patient that he continue to self quarantine at home.  Return precautions given including worsening shortness of breath making it difficult to speak in full sentences, or lips or fingers turning blue.  Patient voiced understanding of this.  He was also instructed on where to find the emergency tent for COVID testing at [facility].    Time spent on phone with patient: 14 minutes

## 2018-05-23 ENCOUNTER — Telehealth: Payer: Self-pay | Admitting: Family Medicine

## 2018-05-23 NOTE — Telephone Encounter (Signed)
Attempted to call patient to discuss his symptoms today and possibility of treating for COPD exacerbation.  No Answer, left VM.

## 2018-05-25 ENCOUNTER — Other Ambulatory Visit: Payer: Self-pay | Admitting: Family Medicine

## 2018-05-25 DIAGNOSIS — E785 Hyperlipidemia, unspecified: Secondary | ICD-10-CM

## 2018-06-09 ENCOUNTER — Other Ambulatory Visit: Payer: Self-pay | Admitting: Family Medicine

## 2018-06-24 ENCOUNTER — Other Ambulatory Visit: Payer: Self-pay | Admitting: Family Medicine

## 2018-07-12 ENCOUNTER — Encounter: Payer: Self-pay | Admitting: Internal Medicine

## 2018-07-25 ENCOUNTER — Encounter: Payer: Self-pay | Admitting: Family Medicine

## 2018-07-25 ENCOUNTER — Other Ambulatory Visit: Payer: Self-pay

## 2018-07-25 ENCOUNTER — Ambulatory Visit (INDEPENDENT_AMBULATORY_CARE_PROVIDER_SITE_OTHER): Payer: Medicare Other | Admitting: Family Medicine

## 2018-07-25 DIAGNOSIS — Z720 Tobacco use: Secondary | ICD-10-CM

## 2018-07-25 DIAGNOSIS — F325 Major depressive disorder, single episode, in full remission: Secondary | ICD-10-CM | POA: Diagnosis not present

## 2018-07-25 DIAGNOSIS — S86902S Unspecified injury of unspecified muscle(s) and tendon(s) at lower leg level, left leg, sequela: Secondary | ICD-10-CM

## 2018-07-25 DIAGNOSIS — I10 Essential (primary) hypertension: Secondary | ICD-10-CM

## 2018-07-25 MED ORDER — FENTANYL 25 MCG/HR TD PT72: 1.0000 | MEDICATED_PATCH | TRANSDERMAL | 0 refills | Status: DC

## 2018-07-25 NOTE — Assessment & Plan Note (Signed)
Stable.  Fentanyl patches help him keep active

## 2018-07-25 NOTE — Patient Instructions (Signed)
Glad things are going well  Continue to take medications as you are  Congrats on getting down to 1/2 pack per day of cigs  Come back in 3 months before you need more refills  Stay safe

## 2018-07-25 NOTE — Progress Notes (Signed)
Subjective  LEG PAIN Feels is under pretty good control  Patches help him keep moving.     He is able to get out and help family No fever or redness  HYPERTENSION Disease Monitoring  Home BP Monitoring (Severity) not checking Symptoms - Chest pain- no    Dyspnea- no Medications (Modifying factors) Compliance-  Daily - knows his med. Lightheadedness-  no  Edema- none new Timing - continuous  Duration - years ROS - See HPI  DEPRESSION Feels pretty well.  His sons are living with him at home during Covid.  He feels things are going better than expected. No suicidal ideation      SMOKING Back smoking about 1/2 ppd.  Tried patch stopped for a few days but went back Does wish to stop.  His family helps remind him to keep to this level other wise he would smoke at least a ppd.    Chief Complaint noted Review of Symptoms - see HPI PMH - Smoking status noted.    Objective Vital Signs reviewed BP 134/80   Pulse 87   SpO2 93%  Psych:  Cognition and judgment appear intact. Alert, communicative  and cooperative with normal attention span and concentration. No apparent delusions, illusions, hallucinations  Assessments/Plans  See after visit summary for details of patient instuctions  Depression Stable.  Continue current medications.  Tried to wean amitriptylene but his sleep worsened  Essential hypertension BP Readings from Last 3 Encounters:  07/25/18 134/80  05/14/18 (!) 142/80  01/18/18 130/80   At goal continue current medications   Sequelae of injury of muscle and tendon of lower limb Stable.  Fentanyl patches help him keep active   Tobacco abuse Unchanged.  Reflected on what allowed him to not smoke more.

## 2018-07-25 NOTE — Assessment & Plan Note (Signed)
Stable.  Continue current medications.  Tried to wean amitriptylene but his sleep worsened

## 2018-07-25 NOTE — Assessment & Plan Note (Signed)
Unchanged.  Reflected on what allowed him to not smoke more.

## 2018-07-25 NOTE — Assessment & Plan Note (Signed)
BP Readings from Last 3 Encounters:  07/25/18 134/80  05/14/18 (!) 142/80  01/18/18 130/80   At goal continue current medications

## 2018-08-01 ENCOUNTER — Other Ambulatory Visit: Payer: Self-pay | Admitting: Family Medicine

## 2018-10-15 ENCOUNTER — Other Ambulatory Visit: Payer: Self-pay | Admitting: *Deleted

## 2018-10-15 MED ORDER — FENTANYL 25 MCG/HR TD PT72: 1.0000 | MEDICATED_PATCH | TRANSDERMAL | 0 refills | Status: DC

## 2018-10-17 ENCOUNTER — Other Ambulatory Visit: Payer: Self-pay | Admitting: Family Medicine

## 2018-10-17 ENCOUNTER — Ambulatory Visit: Payer: Medicare Other | Admitting: Podiatry

## 2018-10-17 DIAGNOSIS — Z23 Encounter for immunization: Secondary | ICD-10-CM | POA: Diagnosis not present

## 2018-10-18 ENCOUNTER — Ambulatory Visit: Payer: Medicare Other | Admitting: Podiatry

## 2018-10-31 ENCOUNTER — Ambulatory Visit (INDEPENDENT_AMBULATORY_CARE_PROVIDER_SITE_OTHER): Payer: Medicare Other | Admitting: Family Medicine

## 2018-10-31 ENCOUNTER — Other Ambulatory Visit: Payer: Self-pay

## 2018-10-31 VITALS — BP 130/80 | HR 83 | Wt 277.6 lb

## 2018-10-31 DIAGNOSIS — Z1211 Encounter for screening for malignant neoplasm of colon: Secondary | ICD-10-CM

## 2018-10-31 DIAGNOSIS — F325 Major depressive disorder, single episode, in full remission: Secondary | ICD-10-CM

## 2018-10-31 DIAGNOSIS — S86902S Unspecified injury of unspecified muscle(s) and tendon(s) at lower leg level, left leg, sequela: Secondary | ICD-10-CM

## 2018-10-31 DIAGNOSIS — I10 Essential (primary) hypertension: Secondary | ICD-10-CM | POA: Diagnosis not present

## 2018-10-31 MED ORDER — FENTANYL 25 MCG/HR TD PT72: 1.0000 | MEDICATED_PATCH | TRANSDERMAL | 0 refills | Status: DC

## 2018-10-31 NOTE — Progress Notes (Signed)
Subjective  Andre Sherman is a 63 y.o. male is presenting with the following  LEG PAIN Feels is under good control  Patches help him keep active.     He is able to get out and help family No fever or redness or excessive swelling  HYPERTENSION Disease Monitoring  Home BP Monitoring (Severity) not checking Symptoms - Chest pain- no    Dyspnea- no Medications (Modifying factors) Compliance-  Daily - knows his medications . Lightheadedness-  no  Edema- none new Timing - continuous  Duration - years ROS - See HPI  DEPRESSION Feels ok.  His younger son at home has severe bipolar and often acts erratically.  . No suicidal ideation.  Sleep is fair   SMOKING Continues smoking about 1/2 ppd.  Would like to stop but not very interested in a plan or acting on this now   Social History   Social History Narrative  . Not on file     Chief Complaint noted Review of Symptoms - see HPI PMH - Smoking status noted.    Objective Vital Signs reviewed BP 130/80   Pulse 83   Wt 277 lb 9.6 oz (125.9 kg)   SpO2 94%   BMI 39.83 kg/m   Assessments/Plans  See after visit summary for details of patient instuctions  Sequelae of injury of muscle and tendon of lower limb Refilled his fentanyl patches.  Reviewed Narcotics DB is appropriate.    Depression Stable   Essential hypertension BP Readings from Last 3 Encounters:  10/31/18 130/80  07/25/18 134/80  05/14/18 (!) 142/80   At goal.  Check labs next visit

## 2018-10-31 NOTE — Patient Instructions (Addendum)
Good to see you today!  Thanks for coming in.  Come back before you need more refills  We will do blood work then  Call if you do not hear from [place]

## 2018-11-01 NOTE — Assessment & Plan Note (Signed)
Stable

## 2018-11-01 NOTE — Assessment & Plan Note (Signed)
Refilled his fentanyl patches.  Reviewed Narcotics DB is appropriate.

## 2018-11-01 NOTE — Assessment & Plan Note (Signed)
BP Readings from Last 3 Encounters:  10/31/18 130/80  07/25/18 134/80  05/14/18 (!) 142/80   At goal.  Check labs next visit

## 2018-11-07 ENCOUNTER — Telehealth: Payer: Self-pay

## 2018-11-07 NOTE — Telephone Encounter (Signed)
Prior authorization for Fentanyl patch. PA completed via cover my meds. Medication was approved through 10/2019. The pharmacy has been notified.

## 2018-11-17 ENCOUNTER — Other Ambulatory Visit: Payer: Self-pay | Admitting: Family Medicine

## 2019-01-03 ENCOUNTER — Other Ambulatory Visit: Payer: Self-pay | Admitting: Family Medicine

## 2019-01-29 ENCOUNTER — Ambulatory Visit (INDEPENDENT_AMBULATORY_CARE_PROVIDER_SITE_OTHER): Payer: Medicare Other | Admitting: Family Medicine

## 2019-01-29 ENCOUNTER — Other Ambulatory Visit: Payer: Self-pay

## 2019-01-29 ENCOUNTER — Encounter: Payer: Self-pay | Admitting: Family Medicine

## 2019-01-29 VITALS — BP 116/74 | HR 83 | Wt 243.4 lb

## 2019-01-29 DIAGNOSIS — S86902S Unspecified injury of unspecified muscle(s) and tendon(s) at lower leg level, left leg, sequela: Secondary | ICD-10-CM

## 2019-01-29 DIAGNOSIS — N289 Disorder of kidney and ureter, unspecified: Secondary | ICD-10-CM | POA: Diagnosis not present

## 2019-01-29 DIAGNOSIS — I10 Essential (primary) hypertension: Secondary | ICD-10-CM | POA: Diagnosis not present

## 2019-01-29 DIAGNOSIS — Z1211 Encounter for screening for malignant neoplasm of colon: Secondary | ICD-10-CM

## 2019-01-29 MED ORDER — FENTANYL 25 MCG/HR TD PT72: 1.0000 | MEDICATED_PATCH | TRANSDERMAL | 0 refills | Status: DC

## 2019-01-29 NOTE — Assessment & Plan Note (Signed)
Using narcotics responsibly.  They are helping with function

## 2019-01-29 NOTE — Patient Instructions (Signed)
Good to see you today!  Thanks for coming in.  Hang in there  I will call you if your tests are not good.  Otherwise I will send you a letter.  If you do not hear from me with in 2 weeks please call our office.    You should get the cologaurd in the mail    Come back in 3 months

## 2019-01-29 NOTE — Progress Notes (Signed)
oloSubjective  Andre Sherman is a 63 y.o. male is presenting with the following  CHRONIC LEG PAIN About the same.  Fentanyl patches allow him to move around.  He has been taking less gabapentin as this does not seem to help much.  No skin lesions  HYPERTENSION Disease Monitoring Home BP Monitoring (Severity) not checking Symptoms - Chest pain- no    Dyspnea- no Medications   Compliance-  Knows his meds. Lightheadedness-  no  Edema- just usual Timing - continuous  Duration - years  RENAL INSUFFICIENCY No more swelling than usual. Taking lisinopril.    Chief Complaint noted Review of Symptoms - see HPI PMH - Smoking status noted.    Objective Vital Signs reviewed BP 116/74   Pulse 83   Wt 243 lb 6.4 oz (110.4 kg)   SpO2 94%   BMI 34.92 kg/m  PHQ9 = 7 Primarily difficulty with sleep   Assessments/Plans  Essential hypertension BP Readings from Last 3 Encounters:  01/29/19 116/74  10/31/18 130/80  07/25/18 134/80   Stable Will check labs   Renal insufficiency Check labs.  Seems stable.  Off NSAIDS and lowering gabapentin dose   Sequelae of injury of muscle and tendon of lower limb Using narcotics responsibly.  They are helping with function    See after visit summary for details of patient instructions

## 2019-01-29 NOTE — Assessment & Plan Note (Signed)
BP Readings from Last 3 Encounters:  01/29/19 116/74  10/31/18 130/80  07/25/18 134/80   Stable Will check labs

## 2019-01-29 NOTE — Assessment & Plan Note (Signed)
Check labs.  Seems stable.  Off NSAIDS and lowering gabapentin dose

## 2019-01-30 ENCOUNTER — Encounter: Payer: Self-pay | Admitting: Family Medicine

## 2019-01-30 LAB — CMP14+EGFR
ALT: 24 IU/L (ref 0–44)
AST: 23 IU/L (ref 0–40)
Albumin/Globulin Ratio: 1.7 (ref 1.2–2.2)
Albumin: 4.6 g/dL (ref 3.8–4.8)
Alkaline Phosphatase: 62 IU/L (ref 39–117)
BUN/Creatinine Ratio: 23 (ref 10–24)
BUN: 27 mg/dL (ref 8–27)
Bilirubin Total: 0.3 mg/dL (ref 0.0–1.2)
CO2: 28 mmol/L (ref 20–29)
Calcium: 10.3 mg/dL — ABNORMAL HIGH (ref 8.6–10.2)
Chloride: 97 mmol/L (ref 96–106)
Creatinine, Ser: 1.18 mg/dL (ref 0.76–1.27)
GFR calc Af Amer: 75 mL/min/{1.73_m2} (ref >59)
GFR calc non Af Amer: 65 mL/min/{1.73_m2} (ref >59)
Globulin, Total: 2.7 g/dL (ref 1.5–4.5)
Glucose: 156 mg/dL — ABNORMAL HIGH (ref 65–99)
Potassium: 5.4 mmol/L — ABNORMAL HIGH (ref 3.5–5.2)
Sodium: 138 mmol/L (ref 134–144)
Total Protein: 7.3 g/dL (ref 6.0–8.5)

## 2019-01-30 LAB — CBC
Hematocrit: 40.5 % (ref 37.5–51.0)
Hemoglobin: 13.9 g/dL (ref 13.0–17.7)
MCH: 32 pg (ref 26.6–33.0)
MCHC: 34.3 g/dL (ref 31.5–35.7)
MCV: 93 fL (ref 79–97)
Platelets: 258 10*3/uL (ref 150–450)
RBC: 4.35 x10E6/uL (ref 4.14–5.80)
RDW: 12 % (ref 11.6–15.4)
WBC: 11.3 10*3/uL — ABNORMAL HIGH (ref 3.4–10.8)

## 2019-02-18 ENCOUNTER — Other Ambulatory Visit: Payer: Self-pay | Admitting: Family Medicine

## 2019-03-25 DIAGNOSIS — Z1211 Encounter for screening for malignant neoplasm of colon: Secondary | ICD-10-CM | POA: Diagnosis not present

## 2019-03-25 DIAGNOSIS — Z1212 Encounter for screening for malignant neoplasm of rectum: Secondary | ICD-10-CM | POA: Diagnosis not present

## 2019-03-27 LAB — COLOGUARD: Cologuard: NEGATIVE

## 2019-03-30 LAB — COLOGUARD: COLOGUARD: NEGATIVE

## 2019-04-02 ENCOUNTER — Other Ambulatory Visit: Payer: Self-pay | Admitting: Family Medicine

## 2019-04-02 DIAGNOSIS — E785 Hyperlipidemia, unspecified: Secondary | ICD-10-CM

## 2019-04-17 ENCOUNTER — Encounter: Payer: Self-pay | Admitting: Family Medicine

## 2019-04-17 ENCOUNTER — Other Ambulatory Visit: Payer: Self-pay

## 2019-04-17 ENCOUNTER — Ambulatory Visit (INDEPENDENT_AMBULATORY_CARE_PROVIDER_SITE_OTHER): Payer: Medicare Other | Admitting: Family Medicine

## 2019-04-17 VITALS — BP 138/80 | HR 80 | Wt 238.0 lb

## 2019-04-17 DIAGNOSIS — S86902S Unspecified injury of unspecified muscle(s) and tendon(s) at lower leg level, left leg, sequela: Secondary | ICD-10-CM | POA: Diagnosis not present

## 2019-04-17 DIAGNOSIS — I10 Essential (primary) hypertension: Secondary | ICD-10-CM | POA: Diagnosis not present

## 2019-04-17 DIAGNOSIS — F325 Major depressive disorder, single episode, in full remission: Secondary | ICD-10-CM | POA: Diagnosis not present

## 2019-04-17 DIAGNOSIS — R739 Hyperglycemia, unspecified: Secondary | ICD-10-CM

## 2019-04-17 LAB — POCT GLYCOSYLATED HEMOGLOBIN (HGB A1C): Hemoglobin A1C: 6.1 % — AB (ref 4.0–5.6)

## 2019-04-17 MED ORDER — FENTANYL 25 MCG/HR TD PT72: 1.0000 | MEDICATED_PATCH | TRANSDERMAL | 0 refills | Status: DC

## 2019-04-17 NOTE — Assessment & Plan Note (Signed)
Worsened mostly seems situational with his son's mental illness.  He does not wish to change medications nor seek counseling.  Has had similar episodes in past that have improved.  Agreed to call me if worsening and discuss with his wife.   Will see back sooner than usual to monitor

## 2019-04-17 NOTE — Patient Instructions (Addendum)
  Please call if you are feeling worse.   I will ask one of our counselors to call you in the next few days  I will be happy to see your son here as a patient  Your Diabetes tests is slightly higher meaning prediabetes.  Keep working on your diet and weight  Seem me in one month or sooner as needed   f you are feeling suicidal or depression symptoms worsen please immediately go to:   [facility]  [address] [place], [place] [address]  [phone] or [phone]

## 2019-04-17 NOTE — Assessment & Plan Note (Signed)
BP Readings from Last 3 Encounters:  04/17/19 138/80  01/29/19 116/74  10/31/18 130/80   Stable continue current medications

## 2019-04-17 NOTE — Progress Notes (Signed)
   CHIEF COMPLAINT / HPI:  DEPRESSION Feeling very down primarily due to his son's mental illness.  He lives with his parents and is very disruptive.  Seems like little hope of improvent.  He continues to take his amitriptyline regularly.  Has thoughts that it would not matter if he was diagnosed with cancer but no thoughts of suicide or self harm.  Does not want to talk with a counselor but would consider in the future.  Has a strong relationship with his wife of 3 years   CHRONIC PAIN Essentially unchanged.  He acknowledges that it seems to hurt more when distressed.  Using fentanyl patches as prescribed.  Some days delays putting on a new patch unless really bothering him   HYPERTENSION Home BP Monitoring readings: not checking  Chest pain- no     Medications   Compliance-  Reports taking all his meds. Lightheadedness-  no  Edema- no   PERTINENT  PMH / PSH: chronic pain    OBJECTIVE: BP 138/80   Pulse 80   Wt 238 lb (108 kg)   SpO2 94%   BMI 34.15 kg/m   Psych:  Cognition and judgment appear intact. Alert, communicative  and cooperative with normal attention span and concentration. No apparent delusions, illusions, hallucinations\ PHQ9 -15  Somewhat   ASSESSMENT / PLAN:  Depression Worsened mostly seems situational with his son's mental illness.  He does not wish to change medications nor seek counseling.  Has had similar episodes in past that have improved.  Agreed to call me if worsening and discuss with his wife.   Will see back sooner than usual to monitor  Essential hypertension BP Readings from Last 3 Encounters:  04/17/19 138/80  01/29/19 116/74  10/31/18 130/80   Stable continue current medications   Sequelae of injury of muscle and tendon of lower limb Chronic pain.  Using medications appropriately   Hyperglycemia A1c has increased remains prediabetes.  He did lose a few lbs since las visit.  We discussed exercise and getting out as beneficial for his blood  sugar and his mood      [name], [place]

## 2019-04-17 NOTE — Assessment & Plan Note (Signed)
A1c has increased remains prediabetes.  He did lose a few lbs since las visit.  We discussed exercise and getting out as beneficial for his blood sugar and his mood

## 2019-04-17 NOTE — Assessment & Plan Note (Signed)
Chronic pain.  Using medications appropriately

## 2019-05-21 DIAGNOSIS — Z23 Encounter for immunization: Secondary | ICD-10-CM | POA: Diagnosis not present

## 2019-05-28 ENCOUNTER — Ambulatory Visit: Payer: Medicare Other | Admitting: Family Medicine

## 2019-05-29 ENCOUNTER — Ambulatory Visit (INDEPENDENT_AMBULATORY_CARE_PROVIDER_SITE_OTHER): Payer: Medicare Other | Admitting: Family Medicine

## 2019-05-29 ENCOUNTER — Other Ambulatory Visit: Payer: Self-pay

## 2019-05-29 ENCOUNTER — Encounter: Payer: Self-pay | Admitting: Family Medicine

## 2019-05-29 DIAGNOSIS — I10 Essential (primary) hypertension: Secondary | ICD-10-CM

## 2019-05-29 DIAGNOSIS — S86902S Unspecified injury of unspecified muscle(s) and tendon(s) at lower leg level, left leg, sequela: Secondary | ICD-10-CM

## 2019-05-29 DIAGNOSIS — Z720 Tobacco use: Secondary | ICD-10-CM

## 2019-05-29 DIAGNOSIS — F325 Major depressive disorder, single episode, in full remission: Secondary | ICD-10-CM

## 2019-05-29 MED ORDER — FENTANYL 25 MCG/HR TD PT72: 1.0000 | MEDICATED_PATCH | TRANSDERMAL | 0 refills | Status: DC

## 2019-05-29 NOTE — Assessment & Plan Note (Signed)
Stable.  Continue current medications.

## 2019-05-29 NOTE — Assessment & Plan Note (Signed)
See after visit summary. 

## 2019-05-29 NOTE — Progress Notes (Signed)
    SUBJECTIVE:   CHIEF COMPLAINT / HPI:   DEPRESSION He feels things are stable.  Continues his medications.  Not interested in counseling. No suicidal ideation   LEG PAIN Needs to get new ace wraps.  Fentanyl enables him to keep active  OBESITY Continues to gain weight.  Wishes to lose and things can make progress in the spring    PERTINENT  PMH / PSH: Hep C, mild renal insuff  OBJECTIVE:   BP 120/80   Pulse 87   Ht 5\' 10"  (1.778 m)   Wt 246 lb (111.6 kg)   SpO2 94%   BMI 35.30 kg/m   L leg with diffuse edema encase in compression sleeve   ASSESSMENT/PLAN:   Depression Stable.  Continue current medications   Essential hypertension At goal Continue medications   Sequelae of injury of muscle and tendon of lower limb Appropriate use of fentanyl patch.  Continue   Tobacco abuse See after visit summary      [name], MD [place]

## 2019-05-29 NOTE — Assessment & Plan Note (Signed)
Appropriate use of fentanyl patch.  Continue

## 2019-05-29 NOTE — Patient Instructions (Addendum)
Good to see you today!  Thanks for coming in.  Your plans  This spring  Walk more  Cut back on smoking  Cut back or dilute fruit juices  Cut back on portions  I'd be happy to see your son  Let me know if any problems   Come back when you need refills

## 2019-05-29 NOTE — Assessment & Plan Note (Signed)
At goal. Continue medications. 

## 2019-05-31 ENCOUNTER — Encounter: Payer: Self-pay | Admitting: Family Medicine

## 2019-06-06 ENCOUNTER — Encounter: Payer: Self-pay | Admitting: Family Medicine

## 2019-06-18 ENCOUNTER — Other Ambulatory Visit: Payer: Self-pay | Admitting: Family Medicine

## 2019-06-18 DIAGNOSIS — Z23 Encounter for immunization: Secondary | ICD-10-CM | POA: Diagnosis not present

## 2019-06-19 ENCOUNTER — Other Ambulatory Visit: Payer: Self-pay

## 2019-06-19 ENCOUNTER — Encounter (HOSPITAL_COMMUNITY): Payer: Self-pay

## 2019-06-19 ENCOUNTER — Ambulatory Visit (HOSPITAL_COMMUNITY)
Admission: EM | Admit: 2019-06-19 | Discharge: 2019-06-19 | Disposition: A | Discharge status: Home / Self Care | Payer: Medicare Other | Attending: Emergency Medicine | Admitting: Emergency Medicine

## 2019-06-19 DIAGNOSIS — S61412A Laceration without foreign body of left hand, initial encounter: Secondary | ICD-10-CM

## 2019-06-19 DIAGNOSIS — Z23 Encounter for immunization: Secondary | ICD-10-CM

## 2019-06-19 MED ORDER — CEPHALEXIN 500 MG PO CAPS: 500.0000 mg | ORAL_CAPSULE | Freq: Four times a day (QID) | ORAL | 0 refills | Status: AC

## 2019-06-19 MED ORDER — TETANUS-DIPHTH-ACELL PERTUSSIS 5-2.5-18.5 LF-MCG/0.5 IM SUSP
INTRAMUSCULAR | Status: AC
  Filled 2019-06-19: qty 0.5

## 2019-06-19 MED ORDER — TETANUS-DIPHTH-ACELL PERTUSSIS 5-2.5-18.5 LF-MCG/0.5 IM SUSP
0.5000 mL | Freq: Once | INTRAMUSCULAR | Status: AC
  Administered 2019-06-19: 0.5 mL via INTRAMUSCULAR

## 2019-06-19 NOTE — ED Triage Notes (Signed)
Pt c/o laceration to right 2nd and 3rd finger. Large deep laceration to crease of left 2nd finger at palm. Pt states he was clearing tall grass yesterday at approx 1400 and cut it on unknown object in grass. Able to bend finger. Hallie Wieters, PA in for eval. Pt applied his own dsg, no medications used PTA. Last tetanus 2015. COVID vaccine in left deltoid yesterday. First COVID vaccine in right deltoid approx 3 weeks ago.

## 2019-06-19 NOTE — Discharge Instructions (Signed)
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
## 2019-06-26 ENCOUNTER — Encounter (HOSPITAL_COMMUNITY): Payer: Self-pay

## 2019-06-26 ENCOUNTER — Other Ambulatory Visit: Payer: Self-pay

## 2019-06-26 ENCOUNTER — Ambulatory Visit (HOSPITAL_COMMUNITY)
Admission: EM | Admit: 2019-06-26 | Discharge: 2019-06-26 | Disposition: A | Discharge status: Home / Self Care | Payer: Medicare Other

## 2019-06-26 DIAGNOSIS — S61211A Laceration without foreign body of left index finger without damage to nail, initial encounter: Secondary | ICD-10-CM

## 2019-06-26 DIAGNOSIS — M79645 Pain in left finger(s): Secondary | ICD-10-CM | POA: Diagnosis not present
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
## 2019-06-26 NOTE — ED Triage Notes (Addendum)
Pt states the sutures came out 2 days ago and they were supposed to stay in for 2-3 more days. Pt has bandage on wound. Bleeding is controlled.

## 2019-06-26 NOTE — Discharge Instructions (Addendum)
Suture was removed Continue to take antibiotic as prescribed and to completion Take OTC Tylenol/ibuprofen as needed for pain Keep wound clean and dry/open to air Follow-up in few days for reevaluation Return or go to ED for worsening of symptoms.

## 2019-10-09 ENCOUNTER — Other Ambulatory Visit: Payer: Self-pay | Admitting: Family Medicine

## 2019-10-15 ENCOUNTER — Ambulatory Visit: Payer: Medicare Other | Admitting: Family Medicine

## 2019-10-15 ENCOUNTER — Telehealth: Payer: Self-pay | Admitting: Family Medicine

## 2019-10-15 NOTE — Telephone Encounter (Signed)
Called cell no vm   Left message to call on his home vm

## 2019-10-18 NOTE — Telephone Encounter (Signed)
Called cell - no VM Called home - left VM to call us as neede

## 2019-10-24 ENCOUNTER — Other Ambulatory Visit: Payer: Self-pay | Admitting: Family Medicine

## 2019-11-05 ENCOUNTER — Ambulatory Visit (INDEPENDENT_AMBULATORY_CARE_PROVIDER_SITE_OTHER): Payer: Medicare Other | Admitting: Family Medicine

## 2019-11-05 ENCOUNTER — Other Ambulatory Visit: Payer: Self-pay

## 2019-11-05 VITALS — BP 118/72 | HR 86 | Wt 232.6 lb

## 2019-11-05 DIAGNOSIS — R739 Hyperglycemia, unspecified: Secondary | ICD-10-CM | POA: Diagnosis not present

## 2019-11-05 DIAGNOSIS — S86902S Unspecified injury of unspecified muscle(s) and tendon(s) at lower leg level, left leg, sequela: Secondary | ICD-10-CM

## 2019-11-05 MED ORDER — FENTANYL 25 MCG/HR TD PT72: 1.0000 | MEDICATED_PATCH | TRANSDERMAL | 0 refills | Status: DC

## 2019-11-05 NOTE — Patient Instructions (Signed)
Good to see you today!  Thanks for coming in.  Keep up with weight loss  Come back in 2 months   Bring all your medications

## 2019-11-05 NOTE — Progress Notes (Signed)
    SUBJECTIVE:   CHIEF COMPLAINT / HPI:   CHRONIC PAIN IN LEG Patient has been out of insurance due to Medicaid change for some months and having to pay out of pocket for medications.  Has been using fentanyl patches sparingly every 4-5 days to take the edge off his pain.  Last fill in [place] database end of July for 5 patches.    PERTINENT  PMH / PSH: Patient arrived 30 minutes after his scheduled appointment so visit was abbreviated   OBJECTIVE:   BP 118/72   Pulse 86   Wt 232 lb 9.6 oz (105.5 kg)   SpO2 93%   BMI 33.37 kg/m   Psych:  Cognition and judgment appear intact. Alert, communicative  and cooperative with normal attention span and concentration. No apparent delusions, illusions, hallucinations   ASSESSMENT/PLAN:   No problem-specific Assessment & Plan notes found for this encounter.     [name], MD [place]

## 2019-11-05 NOTE — Assessment & Plan Note (Signed)
Has had disrupted follow up due to insurance issues.  Will restart patches aiming for each to last about 4 days.   Will Rx 15 patches and have him return in 2 months with all his medication

## 2019-11-23 ENCOUNTER — Other Ambulatory Visit: Payer: Self-pay | Admitting: Family Medicine

## 2019-12-18 ENCOUNTER — Other Ambulatory Visit: Payer: Self-pay | Admitting: Family Medicine

## 2019-12-24 ENCOUNTER — Ambulatory Visit: Payer: Medicare Other | Admitting: Family Medicine

## 2019-12-31 ENCOUNTER — Ambulatory Visit: Payer: Medicare Other | Admitting: Family Medicine

## 2020-01-07 ENCOUNTER — Ambulatory Visit (INDEPENDENT_AMBULATORY_CARE_PROVIDER_SITE_OTHER): Payer: Medicare Other | Admitting: Family Medicine

## 2020-01-07 ENCOUNTER — Encounter: Payer: Self-pay | Admitting: Family Medicine

## 2020-01-07 ENCOUNTER — Other Ambulatory Visit: Payer: Self-pay

## 2020-01-07 VITALS — BP 115/75 | HR 78 | Ht 69.0 in | Wt 235.2 lb

## 2020-01-07 DIAGNOSIS — F325 Major depressive disorder, single episode, in full remission: Secondary | ICD-10-CM

## 2020-01-07 DIAGNOSIS — R739 Hyperglycemia, unspecified: Secondary | ICD-10-CM

## 2020-01-07 DIAGNOSIS — S86902S Unspecified injury of unspecified muscle(s) and tendon(s) at lower leg level, left leg, sequela: Secondary | ICD-10-CM

## 2020-01-07 DIAGNOSIS — I10 Essential (primary) hypertension: Secondary | ICD-10-CM | POA: Diagnosis not present

## 2020-01-07 LAB — POCT GLYCOSYLATED HEMOGLOBIN (HGB A1C): Hemoglobin A1C: 5.8 % — AB (ref 4.0–5.6)

## 2020-01-07 MED ORDER — FENTANYL 25 MCG/HR TD PT72: 1.0000 | MEDICATED_PATCH | TRANSDERMAL | 0 refills | Status: DC

## 2020-01-07 NOTE — Assessment & Plan Note (Signed)
Continue fentanyl to keep him active.  No signs of inappropriate use.  Database reviewed

## 2020-01-07 NOTE — Assessment & Plan Note (Addendum)
Stable.  Will try lowering dose of Elavil given his age and monitor response

## 2020-01-07 NOTE — Assessment & Plan Note (Signed)
BP Readings from Last 3 Encounters:  01/07/20 115/75  11/05/19 118/72  06/26/19 132/87   At goal continue current medications

## 2020-01-07 NOTE — Progress Notes (Signed)
° ° °  SUBJECTIVE:   CHIEF COMPLAINT / HPI:   LEG PAIN Continues about the same.  Using fentanyl patches which help him remain mobile and decrease his irritability   Has not been taking gabapentin   HYPERTENSION Did not bring in medications but reports taking them   HYPERGLYCEMIA Gained a little bit of weight back.  Trying to watch his diet   PERTINENT  PMH / PSH: still smoking   OBJECTIVE:   BP 115/75    Pulse 78    Ht 5\' 9"  (1.753 m)    Wt 235 lb 3.2 oz (106.7 kg)    SpO2 93%    BMI 34.73 kg/m   Alert conversant   ASSESSMENT/PLAN:   Sequelae of injury of muscle and tendon of lower limb Continue fentanyl to keep him active.  No signs of inappropriate use.  Database reviewed   Essential hypertension BP Readings from Last 3 Encounters:  01/07/20 115/75  11/05/19 118/72  06/26/19 132/87   At goal continue current medications   Depression Stable.  Will try lowering dose of Elavil given his age and monitor response   Hyperglycemia A1c is stable      [name], MD [place]

## 2020-01-07 NOTE — Patient Instructions (Signed)
Good to see you today!  Thanks for coming in.  Try taking one tablet of amitriptyline at night.  This might help with your memory   Come back before you need more refills   Bring all your medications next visit  I will call you if your lab tests are not normal.  Otherwise we will discuss them at your next visit.

## 2020-01-07 NOTE — Assessment & Plan Note (Signed)
A1c is stable

## 2020-01-08 LAB — CMP14+EGFR
ALT: 28 IU/L (ref 0–44)
AST: 26 IU/L (ref 0–40)
Albumin/Globulin Ratio: 1.8 (ref 1.2–2.2)
Albumin: 4.8 g/dL (ref 3.8–4.8)
Alkaline Phosphatase: 63 IU/L (ref 44–121)
BUN/Creatinine Ratio: 11 (ref 10–24)
BUN: 18 mg/dL (ref 8–27)
Bilirubin Total: 0.3 mg/dL (ref 0.0–1.2)
CO2: 19 mmol/L — ABNORMAL LOW (ref 20–29)
Calcium: 9.7 mg/dL (ref 8.6–10.2)
Chloride: 96 mmol/L (ref 96–106)
Creatinine, Ser: 1.63 mg/dL — ABNORMAL HIGH (ref 0.76–1.27)
GFR calc Af Amer: 51 mL/min/{1.73_m2} — ABNORMAL LOW (ref >59)
GFR calc non Af Amer: 44 mL/min/{1.73_m2} — ABNORMAL LOW (ref >59)
Globulin, Total: 2.6 g/dL (ref 1.5–4.5)
Glucose: 142 mg/dL — ABNORMAL HIGH (ref 65–99)
Potassium: 4.6 mmol/L (ref 3.5–5.2)
Sodium: 129 mmol/L — ABNORMAL LOW (ref 134–144)
Total Protein: 7.4 g/dL (ref 6.0–8.5)

## 2020-01-13 ENCOUNTER — Telehealth: Payer: Self-pay | Admitting: Family Medicine

## 2020-01-13 DIAGNOSIS — N289 Disorder of kidney and ureter, unspecified: Secondary | ICD-10-CM

## 2020-01-13 NOTE — Telephone Encounter (Signed)
He feels well  Discussed his recent labs  Recommend Stay hydrated but not with just water drink low sugar gatorade or juices Also Stay away from NSAIDs otc.  Recommend tylenol as needed for mild pain  Will repeat next visit   He agrees

## 2020-01-13 NOTE — Assessment & Plan Note (Signed)
His creatine is up and sodium is down.  Likely dehydration with low electrolyte intake.  See phone note

## 2020-01-30 ENCOUNTER — Telehealth: Payer: Self-pay

## 2020-01-30 NOTE — Telephone Encounter (Signed)
Prior Auth for Fentanyl patches places in MDs box for completion. Please return to Page once completed.

## 2020-02-03 NOTE — Telephone Encounter (Signed)
Form returned to me at end of day on 12/3. Form faxed along with office notes, as I could not complete on covermymeds. Will await fax back from insurance company with determination.

## 2020-02-06 ENCOUNTER — Other Ambulatory Visit: Payer: Self-pay | Admitting: Family Medicine

## 2020-02-06 DIAGNOSIS — Z23 Encounter for immunization: Secondary | ICD-10-CM | POA: Diagnosis not present

## 2020-02-11 NOTE — Telephone Encounter (Signed)
Received fax from [facility] approving Fentanyl patches. Pharmacy and patient have been updated.

## 2020-03-11 ENCOUNTER — Other Ambulatory Visit: Payer: Self-pay

## 2020-03-12 NOTE — Telephone Encounter (Signed)
Patient Andre Sherman on nurse line regarding "emergency refill" on fentanyl patches. Patient reports that he has scheduled an appointment with Dr. Erin Hearing on 04/01/20 and needs pain medication to last until scheduled appointment.   To PCP  Please advise  Talbot Grumbling, RN

## 2020-03-14 MED ORDER — FENTANYL 25 MCG/HR TD PT72: 1.0000 | MEDICATED_PATCH | TRANSDERMAL | 0 refills | Status: DC

## 2020-03-14 NOTE — Telephone Encounter (Signed)
Rx sent  Patient needs office visit Is using a patch less than every 3 days

## 2020-03-14 NOTE — Addendum Note (Signed)
Addended by: Talbert Cage L on: 03/14/2020 11:59 AM   Modules accepted: Orders

## 2020-03-14 NOTE — Telephone Encounter (Signed)
Patient last seen on 11/9 Given Rx for 6 weeks of fentanyl patches That was 9 weeks ago Will give one refill

## 2020-04-01 ENCOUNTER — Encounter: Payer: Self-pay | Admitting: Family Medicine

## 2020-04-01 ENCOUNTER — Other Ambulatory Visit: Payer: Self-pay | Admitting: Family Medicine

## 2020-04-01 ENCOUNTER — Other Ambulatory Visit: Payer: Self-pay

## 2020-04-01 ENCOUNTER — Ambulatory Visit (INDEPENDENT_AMBULATORY_CARE_PROVIDER_SITE_OTHER): Payer: Medicare Other | Admitting: Family Medicine

## 2020-04-01 DIAGNOSIS — R350 Frequency of micturition: Secondary | ICD-10-CM | POA: Diagnosis not present

## 2020-04-01 DIAGNOSIS — I10 Essential (primary) hypertension: Secondary | ICD-10-CM

## 2020-04-01 DIAGNOSIS — N289 Disorder of kidney and ureter, unspecified: Secondary | ICD-10-CM

## 2020-04-01 DIAGNOSIS — S86902S Unspecified injury of unspecified muscle(s) and tendon(s) at lower leg level, left leg, sequela: Secondary | ICD-10-CM | POA: Diagnosis not present

## 2020-04-01 MED ORDER — FENTANYL 25 MCG/HR TD PT72: 1.0000 | MEDICATED_PATCH | TRANSDERMAL | 0 refills | Status: DC

## 2020-04-01 MED ORDER — TAMSULOSIN HCL 0.4 MG PO CAPS
0.4000 mg | ORAL_CAPSULE | Freq: Every day | ORAL | 3 refills | Status: DC
Start: 2020-04-01 — End: 2020-04-22

## 2020-04-01 NOTE — Patient Instructions (Addendum)
Good to see you today!  Thanks for coming in.  Leg Pain - Fentanyl patches - come back before you run out  Urination - try the Flomax one tab at night  I will call you if your tests are not good.  Otherwise, I will send you a message on MyChart (if it is active) or a letter in the mail..  If you do not hear from me with in 2 weeks please call our office.    Continue to work on decreasing cigarettes -   Good work with your weight!

## 2020-04-01 NOTE — Assessment & Plan Note (Signed)
Stable on current medications.   Using fentanyl less than every 72 hours.  Remaining active.  [place] CS database reviewed

## 2020-04-01 NOTE — Assessment & Plan Note (Signed)
Symptoms consistent with BPH.  Check PSA and UA.  Trial of flomax.

## 2020-04-01 NOTE — Assessment & Plan Note (Signed)
BP Readings from Last 3 Encounters:  04/01/20 140/72  01/07/20 115/75  11/05/19 118/72   At goal most of the time.

## 2020-04-01 NOTE — Progress Notes (Signed)
    SUBJECTIVE:   CHIEF COMPLAINT / HPI:   URINARY FREQUENCY Notice slower stream, incomplete emptying, multiple nocturia for months.  No pain or bleeding   LEG PAIN Continues about the same.  Using fentanyl patches which help him remain mobile and decrease his irritability   Has not been taking amytriptylene.  Taking gabapentin regularly but thinks he can cut down.  The fentanyl helps him remain active and less irritable   HYPERTENSION Did not bring in medications but reports taking them   HYPERGLYCEMIA Continues to work on his weight   TOBACCO Voices he would like to stop   PERTINENT  PMH / PSH: most of his family had covid   OBJECTIVE:   BP 140/72   Pulse 90   Wt 225 lb 12.8 oz (102.4 kg)   SpO2 94%   BMI 33.34 kg/m   Alert nad Moving without issues around room No edema.  Wearing compression stockings on L leg   ASSESSMENT/PLAN:   Essential hypertension BP Readings from Last 3 Encounters:  04/01/20 140/72  01/07/20 115/75  11/05/19 118/72   At goal most of the time.    Sequelae of injury of muscle and tendon of lower limb Stable on current medications.   Using fentanyl less than every 72 hours.  Remaining active.  [place] CS database reviewed   Urinary frequency Symptoms consistent with BPH.  Check PSA and UA.  Trial of flomax.     Renal insufficiency Asymptomatic.  Check BMET     [name], MD [place]

## 2020-04-01 NOTE — Assessment & Plan Note (Signed)
Asymptomatic.  Check BMET

## 2020-04-02 ENCOUNTER — Encounter: Payer: Self-pay | Admitting: Family Medicine

## 2020-04-02 LAB — BASIC METABOLIC PANEL
BUN/Creatinine Ratio: 22 (ref 10–24)
BUN: 22 mg/dL (ref 8–27)
CO2: 20 mmol/L (ref 20–29)
Calcium: 10.1 mg/dL (ref 8.6–10.2)
Chloride: 95 mmol/L — ABNORMAL LOW (ref 96–106)
Creatinine, Ser: 0.99 mg/dL (ref 0.76–1.27)
GFR calc Af Amer: 93 mL/min/{1.73_m2} (ref >59)
GFR calc non Af Amer: 80 mL/min/{1.73_m2} (ref >59)
Glucose: 153 mg/dL — ABNORMAL HIGH (ref 65–99)
Potassium: 4.4 mmol/L (ref 3.5–5.2)
Sodium: 132 mmol/L — ABNORMAL LOW (ref 134–144)

## 2020-04-02 LAB — PSA: Prostate Specific Ag, Serum: 4.7 ng/mL — ABNORMAL HIGH (ref 0.0–4.0)

## 2020-04-21 ENCOUNTER — Other Ambulatory Visit: Payer: Self-pay | Admitting: Family Medicine

## 2020-04-21 DIAGNOSIS — E785 Hyperlipidemia, unspecified: Secondary | ICD-10-CM

## 2020-06-18 ENCOUNTER — Other Ambulatory Visit: Payer: Self-pay

## 2020-06-18 NOTE — Telephone Encounter (Signed)
Patient presents to clinic requesting refill on Fentanyl patches. Patient is requesting one box to hold him until he can follow up with PCP. Patient scheduled for 5/11 with PCP.   Talbot Grumbling, RN

## 2020-06-19 MED ORDER — FENTANYL 25 MCG/HR TD PT72: 1.0000 | MEDICATED_PATCH | TRANSDERMAL | 0 refills | Status: DC

## 2020-07-08 ENCOUNTER — Encounter: Payer: Self-pay | Admitting: Family Medicine

## 2020-07-08 ENCOUNTER — Other Ambulatory Visit: Payer: Self-pay

## 2020-07-08 ENCOUNTER — Ambulatory Visit (INDEPENDENT_AMBULATORY_CARE_PROVIDER_SITE_OTHER): Payer: Medicare Other | Admitting: Family Medicine

## 2020-07-08 VITALS — BP 124/72 | HR 80 | Wt 222.6 lb

## 2020-07-08 DIAGNOSIS — E781 Pure hyperglyceridemia: Secondary | ICD-10-CM

## 2020-07-08 DIAGNOSIS — F3289 Other specified depressive episodes: Secondary | ICD-10-CM | POA: Diagnosis not present

## 2020-07-08 DIAGNOSIS — I10 Essential (primary) hypertension: Secondary | ICD-10-CM

## 2020-07-08 DIAGNOSIS — Z720 Tobacco use: Secondary | ICD-10-CM | POA: Diagnosis not present

## 2020-07-08 DIAGNOSIS — R350 Frequency of micturition: Secondary | ICD-10-CM

## 2020-07-08 DIAGNOSIS — R972 Elevated prostate specific antigen [PSA]: Secondary | ICD-10-CM | POA: Diagnosis not present

## 2020-07-08 LAB — POCT URINALYSIS DIP (MANUAL ENTRY)
Bilirubin, UA: NEGATIVE
Blood, UA: NEGATIVE
Glucose, UA: NEGATIVE mg/dL
Ketones, POC UA: NEGATIVE mg/dL
Leukocytes, UA: NEGATIVE
Nitrite, UA: NEGATIVE
Protein Ur, POC: NEGATIVE mg/dL
Spec Grav, UA: 1.015 (ref 1.010–1.025)
Urobilinogen, UA: 0.2 E.U./dL
pH, UA: 5.5 (ref 5.0–8.0)

## 2020-07-08 MED ORDER — FENTANYL 25 MCG/HR TD PT72: 1.0000 | MEDICATED_PATCH | TRANSDERMAL | 0 refills | Status: DC

## 2020-07-08 NOTE — Patient Instructions (Signed)
Good to see you today - Thank you for coming in  Things we discussed today:  Hypertension Try to check your blood pressure several times a week.  Let me know if greater than 140/90   Urine Try taking 2 flomax in the evenings.  Let me know if his helps  I will call you if your tests are not good.  Otherwise, I will send you a message on MyChart (if it is active) or a letter in the mail..  If you do not hear from me with in 2 weeks please call our office.     Think hard about stopping smoking completely   Please always bring your medication bottles  Come back to see me in 3 months

## 2020-07-08 NOTE — Assessment & Plan Note (Signed)
Precontemplative.  We discussed possibilities

## 2020-07-08 NOTE — Assessment & Plan Note (Signed)
In remission.  Tolerating being off TCA very well

## 2020-07-08 NOTE — Assessment & Plan Note (Signed)
BP Readings from Last 3 Encounters:  07/08/20 (!) 150/86  04/01/20 140/72  01/07/20 115/75   Was improved at recheck.  Continue current medications

## 2020-07-08 NOTE — Assessment & Plan Note (Signed)
The fentanyl patches keep him active and he is using appropriately.

## 2020-07-08 NOTE — Assessment & Plan Note (Signed)
Unchanged with flomax.  UA reassuring. Consistent with BPH.  No signs of prostatitis or UTI.   Will recheck PSA and try higher dose of Flomax
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
## 2020-07-09 ENCOUNTER — Encounter: Payer: Self-pay | Admitting: Family Medicine

## 2020-07-09 LAB — PSA: Prostate Specific Ag, Serum: 1.4 ng/mL (ref 0.0–4.0)

## 2020-07-09 LAB — LDL CHOLESTEROL, DIRECT: LDL Direct: 84 mg/dL (ref 0–99)

## 2020-08-25 DIAGNOSIS — Z23 Encounter for immunization: Secondary | ICD-10-CM | POA: Diagnosis not present

## 2020-09-30 ENCOUNTER — Ambulatory Visit: Payer: Medicare Other | Admitting: Family Medicine

## 2020-09-30 NOTE — Progress Notes (Deleted)
    SUBJECTIVE:   CHIEF COMPLAINT / HPI:   Hypertension   LUTS Try taking 2 flomax in the evenings.  Let me know if his helps  Tobacco  PERTINENT  PMH / PSH: ***  OBJECTIVE:   There were no vitals taken for this visit.  ***  ASSESSMENT/PLAN:   No problem-specific Assessment & Plan notes found for this encounter.     [name], MD [place]

## 2020-10-09 ENCOUNTER — Other Ambulatory Visit: Payer: Self-pay

## 2020-10-09 ENCOUNTER — Ambulatory Visit (INDEPENDENT_AMBULATORY_CARE_PROVIDER_SITE_OTHER): Payer: Medicare Other | Admitting: Family Medicine

## 2020-10-09 VITALS — BP 140/65 | HR 63 | Ht 70.0 in | Wt 218.2 lb

## 2020-10-09 DIAGNOSIS — R350 Frequency of micturition: Secondary | ICD-10-CM

## 2020-10-09 NOTE — Patient Instructions (Addendum)
It was great seeing you today!  Today we discussed refilling your Flomax at the dose discussed with Dr. Erin Hearing. If you feel dizzy, lightheaded, start to have headaches please go back to taking it once a day.   Please check-out at the front desk before leaving the clinic. Schedule to follow up with Dr. Erin Hearing  in about 3 months for your urinary symptoms, but if you need to be seen earlier than that for any new issues we're happy to fit you in, just give [place] a call!   If you haven't already, sign up for My Chart to have easy access to your labs results, and communication with your primary care physician.  Feel free to call with any questions or concerns at any time, at [phone].   Take care,  Dr. Shary Key [facility]

## 2020-10-09 NOTE — Progress Notes (Signed)
    SUBJECTIVE:   CHIEF COMPLAINT / HPI:   Andre Sherman is a 65 yo who presents to discuss his urinary frequency and medication. States he is up every 2 hours all night long due to his urinary symptoms. He states he has been working with Dr. Erin Hearing and that at last visit he was recommended to increase to 2 a day of his Flomax but pharmacy does not have this change. Requesting a refill of Flomax and to change in the system from 1 a day to 2 a day.   States the Fentanyl patches have been working for his lower extremity pain and hes been trying to stretch the patches out to avoid overuse.   Additionally, he endorses smoking about 8-10 cigarettes a day, states he has smoked about a pack a day for the majority of his life. States nothing bad has happened to him thus far. Discussed the risks of smoking which he expressed understanding. Denies wanting additional resources to help me quit.   Per last note on 5/11:   Depression In remission.  Tolerating being off TCA very well    Urinary frequency Unchanged with flomax.  UA reassuring. Consistent with BPH.  No signs of prostatitis or UTI.   Will recheck PSA and try higher dose of Flomax   Tobacco abuse Precontemplative.  We discussed possibilities    Sequelae of injury of muscle and tendon of lower limb The fentanyl patches keep him active and he is using appropriately.    OBJECTIVE:   BP 140/65   Pulse 63   Ht '5\' 10"'$  (1.778 m)   Wt 218 lb 3.2 oz (99 kg)   SpO2 93%   BMI 31.31 kg/m    General: alert, pleasant, NAD CV: RRR no murmurs  Resp: mild course lung sounds diffusely. Normal WOB  GI: soft, non distended  Extremities: warm, dry. No LE edema   ASSESSMENT/PLAN:   No problem-specific Assessment & Plan notes found for this encounter.   Urinary frequency Discussed increasing Flomax with PCP at prior appt. Requesting refill today and to indicate the increase from 1 to 2 a day in the refill. Discussed side effects and to go  back down to 1 a day if experiencing light headedness, dizziness, etc. Also recommended follow up with PCP in 3 months to check on urinary symptoms.   Tobacco abuse Patient endorses he has cut down. Discussed risks associated. Denies wanting additional support or resources. Will let us know if he does.   [place]

## 2020-10-12 MED ORDER — TAMSULOSIN HCL 0.4 MG PO CAPS: 0.4000 mg | ORAL_CAPSULE | Freq: Two times a day (BID) | ORAL | 0 refills | Status: DC

## 2020-10-13 ENCOUNTER — Telehealth: Payer: Self-pay

## 2020-10-13 NOTE — Telephone Encounter (Signed)
Patient calls nurse line reporting he is out of all Fentanyl Patches. Patient reports they are sent in a certain way so he can pick up as instructed. Will forward to PCP.

## 2020-10-14 MED ORDER — FENTANYL 25 MCG/HR TD PT72: 1.0000 | MEDICATED_PATCH | TRANSDERMAL | 0 refills | Status: DC

## 2020-10-29 ENCOUNTER — Other Ambulatory Visit: Payer: Self-pay | Admitting: Family Medicine

## 2020-11-05 ENCOUNTER — Other Ambulatory Visit: Payer: Self-pay | Admitting: Family Medicine

## 2020-11-29 ENCOUNTER — Other Ambulatory Visit: Payer: Self-pay | Admitting: Family Medicine

## 2020-12-23 ENCOUNTER — Telehealth: Payer: Self-pay | Admitting: *Deleted

## 2020-12-23 ENCOUNTER — Ambulatory Visit: Payer: Medicare Other | Admitting: Family Medicine

## 2020-12-23 NOTE — Telephone Encounter (Signed)
Pt called in stating he missed his appt. Wanted dr Erin Hearing to cal him and discuss his memory loss. Please advise. Andre Sherman Kennon Holter, CMA

## 2020-12-24 MED ORDER — FENTANYL 25 MCG/HR TD PT72: 1.0000 | MEDICATED_PATCH | TRANSDERMAL | 0 refills | Status: DC

## 2020-12-24 NOTE — Telephone Encounter (Signed)
Spoke with him He forgot the appointment He will make another asap Agreed to write for duragesic patches until then  Last wrote 8/17 for last fill 8 weeks from then which would last until  10/27  Will send in one month supply to last till appointment

## 2021-01-19 ENCOUNTER — Encounter: Payer: Self-pay | Admitting: Family Medicine

## 2021-01-19 ENCOUNTER — Ambulatory Visit (INDEPENDENT_AMBULATORY_CARE_PROVIDER_SITE_OTHER): Payer: Medicare Other | Admitting: Family Medicine

## 2021-01-19 ENCOUNTER — Ambulatory Visit (INDEPENDENT_AMBULATORY_CARE_PROVIDER_SITE_OTHER): Payer: Medicare Other

## 2021-01-19 ENCOUNTER — Other Ambulatory Visit: Payer: Self-pay

## 2021-01-19 VITALS — BP 138/80 | HR 89 | Wt 220.6 lb

## 2021-01-19 DIAGNOSIS — Z23 Encounter for immunization: Secondary | ICD-10-CM

## 2021-01-19 DIAGNOSIS — S86902S Unspecified injury of unspecified muscle(s) and tendon(s) at lower leg level, left leg, sequela: Secondary | ICD-10-CM

## 2021-01-19 DIAGNOSIS — Z720 Tobacco use: Secondary | ICD-10-CM | POA: Diagnosis not present

## 2021-01-19 DIAGNOSIS — Z122 Encounter for screening for malignant neoplasm of respiratory organs: Secondary | ICD-10-CM

## 2021-01-19 DIAGNOSIS — R350 Frequency of micturition: Secondary | ICD-10-CM | POA: Diagnosis not present

## 2021-01-19 DIAGNOSIS — I10 Essential (primary) hypertension: Secondary | ICD-10-CM | POA: Diagnosis not present

## 2021-01-19 MED ORDER — FENTANYL 25 MCG/HR TD PT72: 1.0000 | MEDICATED_PATCH | TRANSDERMAL | 0 refills | Status: DC

## 2021-01-19 MED ORDER — FENTANYL 25 MCG/HR TD PT72
1.0000 | MEDICATED_PATCH | TRANSDERMAL | 0 refills | Status: DC
Start: 2021-01-19 — End: 2021-04-07

## 2021-01-19 NOTE — Assessment & Plan Note (Signed)
Not interested in stopping currently.

## 2021-01-19 NOTE — Progress Notes (Signed)
    SUBJECTIVE:   CHIEF COMPLAINT / HPI:   No complaints  Urinary frequency Continues to get up several times at night.   No pain or bleeding.  Taking tamsulosin one in AM and one in PM.   Tobacco abuse Continues to smoke.  No really wanting to stop  Limb Pain Continues.  He remains active.  No skin lesions.  The patches allow him to move more and help some with sleep.  Decided to try two gabapentin at night and one in the AM (reverse current regimen) to help with sleep  PERTINENT  PMH / PSH: Is switching insurances but not sure of name of new one  OBJECTIVE:   BP 138/80   Pulse 89   Wt 220 lb 9.6 oz (100.1 kg)   SpO2 93%   BMI 31.65 kg/m   Psych:  Cognition and judgment appear intact. Alert, communicative  and cooperative with normal attention span and concentration. No apparent delusions, illusions, hallucinations   ASSESSMENT/PLAN:   Essential hypertension Good control today.  Will check labs in February   Urinary frequency Try taking tamsulosin together at night instead of twice a day.  Should recheck PSA next visit   Tobacco abuse Not interested in stopping currently.    Sequelae of injury of muscle and tendon of lower limb Stable.  Continues to use fentanyl patch to maintain mobility.  Using appropriately      Andre Covert, MD [place]

## 2021-01-19 NOTE — Assessment & Plan Note (Signed)
Good control today.  Will check labs in February

## 2021-01-19 NOTE — Assessment & Plan Note (Signed)
Try taking tamsulosin together at night instead of twice a day.  Should recheck PSA next visit

## 2021-01-19 NOTE — Patient Instructions (Addendum)
Good to see you today - Thank you for coming in  Things we discussed today:   Take 2 gabapentin at night and one in the AM  Take 2 tamsulosin at night   Lung Cancer Screening - I will try to order once your insurance is stable   Please always bring your medication bottles  Come back to see me in February

## 2021-01-19 NOTE — Progress Notes (Signed)
   Covid-19 Vaccination Clinic  Name:  Andre Sherman    MRN: [id number] DOB: September 11, 1955  01/19/2021  [name] was observed post Covid-19 immunization for 15 minutes without incident. He was provided with Vaccine Information Sheet and instruction to access the V-Safe system.   [name] was instructed to call 911 with any severe reactions post vaccine: Difficulty breathing  Swelling of face and throat  A fast heartbeat  A bad rash all over body  Dizziness and weakness

## 2021-01-19 NOTE — Assessment & Plan Note (Signed)
Stable.  Continues to use fentanyl patch to maintain mobility.  Using appropriately

## 2021-01-28 ENCOUNTER — Other Ambulatory Visit: Payer: Self-pay

## 2021-01-28 MED ORDER — ALLOPURINOL 100 MG PO TABS: 100.0000 mg | ORAL_TABLET | Freq: Every day | ORAL | 1 refills | Status: DC

## 2021-01-28 MED ORDER — GABAPENTIN 800 MG PO TABS: ORAL_TABLET | ORAL | 1 refills | Status: DC

## 2021-01-29 ENCOUNTER — Other Ambulatory Visit: Payer: Self-pay | Admitting: Family Medicine

## 2021-01-29 MED ORDER — GABAPENTIN 800 MG PO TABS: ORAL_TABLET | ORAL | 1 refills | Status: DC

## 2021-02-16 DIAGNOSIS — Z03818 Encounter for observation for suspected exposure to other biological agents ruled out: Secondary | ICD-10-CM | POA: Diagnosis not present

## 2021-02-16 DIAGNOSIS — Z20822 Contact with and (suspected) exposure to covid-19: Secondary | ICD-10-CM | POA: Diagnosis not present

## 2021-02-23 ENCOUNTER — Other Ambulatory Visit: Payer: Self-pay

## 2021-02-23 DIAGNOSIS — E785 Hyperlipidemia, unspecified: Secondary | ICD-10-CM

## 2021-02-24 MED ORDER — ATORVASTATIN CALCIUM 40 MG PO TABS: 40.0000 mg | ORAL_TABLET | Freq: Every day | ORAL | 3 refills | Status: DC

## 2021-02-24 MED ORDER — TAMSULOSIN HCL 0.4 MG PO CAPS: 0.8000 mg | ORAL_CAPSULE | Freq: Every day | ORAL | 2 refills | Status: DC

## 2021-03-02 ENCOUNTER — Other Ambulatory Visit: Payer: Self-pay

## 2021-03-03 MED ORDER — LISINOPRIL-HYDROCHLOROTHIAZIDE 20-25 MG PO TABS
1.0000 | ORAL_TABLET | Freq: Every day | ORAL | 3 refills | Status: DC
Start: 2021-03-03 — End: 2021-12-07

## 2021-03-12 ENCOUNTER — Telehealth: Payer: Self-pay | Admitting: *Deleted

## 2021-03-12 NOTE — Telephone Encounter (Signed)
Patient called concerned about his script for his pain patch.  He said that he wasn't able to fill his script and I tried to explain to patient that he wouldn't be able to fill until 03-16-21.  Patient would like to speak with provider as he is concerned about not getting his insurance Personnel officer) to pay for it.  Will forward to MD.  Johnney Ou

## 2021-03-15 ENCOUNTER — Telehealth: Payer: Self-pay

## 2021-03-15 ENCOUNTER — Other Ambulatory Visit: Payer: Self-pay

## 2021-03-15 MED ORDER — FENTANYL 25 MCG/HR TD PT72: 1.0000 | MEDICATED_PATCH | TRANSDERMAL | 0 refills | Status: DC

## 2021-03-15 NOTE — Telephone Encounter (Signed)
Received fax from pharmacy, PA needed on Fentanyl Patches. Clinical questions submitted via Cover My Meds. Waiting on response, could take up to 72 hours.  Cover My Meds info: Key: [id number]

## 2021-03-15 NOTE — Telephone Encounter (Signed)
Called [phone] left VM to call our office  Refilled on 2 week fentanyl request from [facility]

## 2021-03-16 NOTE — Telephone Encounter (Signed)
Medication approved through 02/27/2022. The pharmacy has been updated.

## 2021-03-17 DIAGNOSIS — F1020 Alcohol dependence, uncomplicated: Secondary | ICD-10-CM | POA: Diagnosis not present

## 2021-03-18 ENCOUNTER — Telehealth: Payer: Self-pay

## 2021-03-18 NOTE — Telephone Encounter (Signed)
Patient returns call to nurse line. Advised patient to contact [facility] in regards to transferring prescriptions to new pharmacy.   Patient verbalizes understanding.   Talbot Grumbling, RN

## 2021-03-18 NOTE — Telephone Encounter (Signed)
Patient LVM on nurse line regarding request to switch medication to [facility]. Patient also had questions about quantity of fentanyl patches.   I attempted to return call to patient. Unfortunately, no answer. LVM for patient to return call to office to discuss further.   Talbot Grumbling, RN

## 2021-03-19 DIAGNOSIS — F1020 Alcohol dependence, uncomplicated: Secondary | ICD-10-CM | POA: Diagnosis not present

## 2021-03-23 ENCOUNTER — Other Ambulatory Visit: Payer: Self-pay

## 2021-03-23 MED ORDER — TAMSULOSIN HCL 0.4 MG PO CAPS: 0.8000 mg | ORAL_CAPSULE | Freq: Every day | ORAL | 2 refills | Status: DC

## 2021-03-24 DIAGNOSIS — F1220 Cannabis dependence, uncomplicated: Secondary | ICD-10-CM | POA: Diagnosis not present

## 2021-03-24 DIAGNOSIS — F1020 Alcohol dependence, uncomplicated: Secondary | ICD-10-CM | POA: Diagnosis not present

## 2021-03-25 ENCOUNTER — Other Ambulatory Visit: Payer: Self-pay

## 2021-03-25 MED ORDER — ALLOPURINOL 100 MG PO TABS: 100.0000 mg | ORAL_TABLET | Freq: Every day | ORAL | 1 refills | Status: DC

## 2021-03-25 MED ORDER — METOPROLOL TARTRATE 25 MG PO TABS: 50.0000 mg | ORAL_TABLET | Freq: Two times a day (BID) | ORAL | 2 refills | Status: DC

## 2021-03-26 DIAGNOSIS — F1020 Alcohol dependence, uncomplicated: Secondary | ICD-10-CM | POA: Diagnosis not present

## 2021-03-29 DIAGNOSIS — F1220 Cannabis dependence, uncomplicated: Secondary | ICD-10-CM | POA: Diagnosis not present

## 2021-03-29 DIAGNOSIS — F1020 Alcohol dependence, uncomplicated: Secondary | ICD-10-CM | POA: Diagnosis not present

## 2021-03-30 DIAGNOSIS — F1020 Alcohol dependence, uncomplicated: Secondary | ICD-10-CM | POA: Diagnosis not present

## 2021-03-31 DIAGNOSIS — F1020 Alcohol dependence, uncomplicated: Secondary | ICD-10-CM | POA: Diagnosis not present

## 2021-03-31 DIAGNOSIS — F1220 Cannabis dependence, uncomplicated: Secondary | ICD-10-CM | POA: Diagnosis not present

## 2021-04-01 ENCOUNTER — Other Ambulatory Visit: Payer: Self-pay

## 2021-04-01 DIAGNOSIS — F1020 Alcohol dependence, uncomplicated: Secondary | ICD-10-CM | POA: Diagnosis not present

## 2021-04-02 DIAGNOSIS — F1020 Alcohol dependence, uncomplicated: Secondary | ICD-10-CM | POA: Diagnosis not present

## 2021-04-02 MED ORDER — GABAPENTIN 800 MG PO TABS: ORAL_TABLET | ORAL | 1 refills | Status: DC

## 2021-04-05 DIAGNOSIS — F1220 Cannabis dependence, uncomplicated: Secondary | ICD-10-CM | POA: Diagnosis not present

## 2021-04-05 DIAGNOSIS — F1020 Alcohol dependence, uncomplicated: Secondary | ICD-10-CM | POA: Diagnosis not present

## 2021-04-06 DIAGNOSIS — F1020 Alcohol dependence, uncomplicated: Secondary | ICD-10-CM | POA: Diagnosis not present

## 2021-04-07 ENCOUNTER — Encounter: Payer: Self-pay | Admitting: Family Medicine

## 2021-04-07 ENCOUNTER — Ambulatory Visit (INDEPENDENT_AMBULATORY_CARE_PROVIDER_SITE_OTHER): Payer: Medicare HMO | Admitting: Family Medicine

## 2021-04-07 ENCOUNTER — Other Ambulatory Visit: Payer: Self-pay | Admitting: Family Medicine

## 2021-04-07 ENCOUNTER — Other Ambulatory Visit: Payer: Self-pay

## 2021-04-07 VITALS — BP 126/72 | HR 70 | Wt 225.0 lb

## 2021-04-07 DIAGNOSIS — J40 Bronchitis, not specified as acute or chronic: Secondary | ICD-10-CM | POA: Insufficient documentation

## 2021-04-07 DIAGNOSIS — Z720 Tobacco use: Secondary | ICD-10-CM | POA: Diagnosis not present

## 2021-04-07 DIAGNOSIS — R739 Hyperglycemia, unspecified: Secondary | ICD-10-CM

## 2021-04-07 DIAGNOSIS — R350 Frequency of micturition: Secondary | ICD-10-CM

## 2021-04-07 DIAGNOSIS — Z89422 Acquired absence of other left toe(s): Secondary | ICD-10-CM | POA: Diagnosis not present

## 2021-04-07 DIAGNOSIS — S86902S Unspecified injury of unspecified muscle(s) and tendon(s) at lower leg level, left leg, sequela: Secondary | ICD-10-CM

## 2021-04-07 DIAGNOSIS — F1020 Alcohol dependence, uncomplicated: Secondary | ICD-10-CM | POA: Diagnosis not present

## 2021-04-07 DIAGNOSIS — I10 Essential (primary) hypertension: Secondary | ICD-10-CM

## 2021-04-07 LAB — POCT GLYCOSYLATED HEMOGLOBIN (HGB A1C): Hemoglobin A1C: 5.9 % — AB (ref 4.0–5.6)

## 2021-04-07 MED ORDER — FENTANYL 25 MCG/HR TD PT72: 1.0000 | MEDICATED_PATCH | TRANSDERMAL | 0 refills | Status: DC

## 2021-04-07 MED ORDER — PROAIR HFA 108 (90 BASE) MCG/ACT IN AERS: 1.0000 | INHALATION_SPRAY | Freq: Four times a day (QID) | RESPIRATORY_TRACT | 1 refills | Status: DC | PRN

## 2021-04-07 NOTE — Assessment & Plan Note (Signed)
Likely viral bronchitis.  Cautioned about signs of pneumonia.  Will refill his albuterol He almost certainly has underlying COPD.  Hopefully smoking cessation will help.  Will hold off on any controlling medications until this is better established and if he has continued symptoms

## 2021-04-07 NOTE — Assessment & Plan Note (Signed)
Continue Fentanyl patches.  Cautioned about sedation with the addition of trazodone by outside provider

## 2021-04-07 NOTE — Progress Notes (Signed)
° ° °  SUBJECTIVE:   CHIEF COMPLAINT / HPI:   Chronic Pain  Feels this is stable Take 2 gabapentin at night and one in the AM Last fentanyl patch fill   03/29/2021  01/19/2021         Anxiety Saw an outside NP who started him on trazodone 50 mg at night and hydroxyzine 25 mg (he has not taken)   He feels he is sleeping better   Tobacco Use Would like to stop smoking.  Getting over a bad bronchitis attack.  Will to see Dr Valentina Lucks  Bronchitis Sick for the last month.  Cough with slowly improving shortness of breath.  Not using albuterol.  Smoking less.  No fever or edema  Nocturia Having continued frequency at night.  Taking 2 tamsulosin was not very helpful.  No pain or bleeding.   OBJECTIVE:   BP 126/72    Pulse 70    Wt 225 lb (102.1 kg)    SpO2 94%    BMI 32.28 kg/m   Frequent coughing no sputum Lungs - diffuse expiratory wheeze.  No focal dullness Heart - Regular rate and rhythm.  No murmurs, gallops or rubs.    L leg is bandaged.  No edema   ASSESSMENT/PLAN:   Urinary frequency He is ok with referring to urology but wishes to wait until better finances.  Tobacco abuse Has been precontemplative/contemplative for years.  Patches did not seem to help.  Very much wishes to stop now with recent bronchtis.  Will refer to Dr Valentina Lucks   Sequelae of injury of muscle and tendon of lower limb Continue Fentanyl patches.  Cautioned about sedation with the addition of trazodone by outside provider   Bronchitis Likely viral bronchitis.  Cautioned about signs of pneumonia.  Will refill his albuterol He almost certainly has underlying COPD.  Hopefully smoking cessation will help.  Will hold off on any controlling medications until this is better established and if he has continued symptoms      [name], [place]

## 2021-04-07 NOTE — Assessment & Plan Note (Signed)
Has been precontemplative/contemplative for years.  Patches did not seem to help.  Very much wishes to stop now with recent bronchtis.  Will refer to Dr Valentina Lucks

## 2021-04-07 NOTE — Patient Instructions (Addendum)
Good to see you today - Thank you for coming in  Things we discussed today:  1, Make an appointment to see Dr Valentina Lucks for smoking cessation  2. Call me if the breathing is not improving or gets any worse  3, Will refer you to a Urologist for the peeing when you would like  4.Use Debrox or Murine ear wax system daily  5.  I refilled your Fentanyl for 6 weeks.  6. Be very careful taking the fentanyl and the trazadone and the hydroxyzine and the gabapentin all together   Please always bring your medication bottles  Come back to see me in 1 month

## 2021-04-07 NOTE — Assessment & Plan Note (Signed)
He is ok with referring to urology but wishes to wait until better finances.

## 2021-04-08 ENCOUNTER — Ambulatory Visit (INDEPENDENT_AMBULATORY_CARE_PROVIDER_SITE_OTHER): Payer: Medicare HMO | Admitting: Pharmacist

## 2021-04-08 ENCOUNTER — Encounter: Payer: Self-pay | Admitting: Family Medicine

## 2021-04-08 ENCOUNTER — Other Ambulatory Visit: Payer: Self-pay

## 2021-04-08 ENCOUNTER — Encounter: Payer: Self-pay | Admitting: Pharmacist

## 2021-04-08 VITALS — BP 123/79 | HR 60 | Ht 70.5 in | Wt 220.6 lb

## 2021-04-08 DIAGNOSIS — Z720 Tobacco use: Secondary | ICD-10-CM | POA: Diagnosis not present

## 2021-04-08 LAB — CMP14+EGFR
ALT: 19 IU/L (ref 0–44)
AST: 25 IU/L (ref 0–40)
Albumin/Globulin Ratio: 2 (ref 1.2–2.2)
Albumin: 4.7 g/dL (ref 3.8–4.8)
Alkaline Phosphatase: 59 IU/L (ref 44–121)
BUN/Creatinine Ratio: 20 (ref 10–24)
BUN: 20 mg/dL (ref 8–27)
Bilirubin Total: 0.4 mg/dL (ref 0.0–1.2)
CO2: 24 mmol/L (ref 20–29)
Calcium: 10 mg/dL (ref 8.6–10.2)
Chloride: 95 mmol/L — ABNORMAL LOW (ref 96–106)
Creatinine, Ser: 1 mg/dL (ref 0.76–1.27)
Globulin, Total: 2.3 g/dL (ref 1.5–4.5)
Glucose: 141 mg/dL — ABNORMAL HIGH (ref 70–99)
Potassium: 5 mmol/L (ref 3.5–5.2)
Sodium: 131 mmol/L — ABNORMAL LOW (ref 134–144)
Total Protein: 7 g/dL (ref 6.0–8.5)
eGFR: 84 mL/min/{1.73_m2} (ref >59)

## 2021-04-08 MED ORDER — VARENICLINE TARTRATE 1 MG PO TABS: 1.0000 mg | ORAL_TABLET | Freq: Two times a day (BID) | ORAL | 3 refills | Status: DC

## 2021-04-08 MED ORDER — VARENICLINE TARTRATE 0.5 MG PO TABS: ORAL_TABLET | ORAL | 0 refills | Status: DC

## 2021-04-08 NOTE — Patient Instructions (Addendum)
Nice meeting you today!  We are so proud of you for wanting to quit smoking.   Start Chantix:  Start taking 1 tablet (0.5 mg) once daily with a meal. This is [facility].  After 1 week, increase to 1 tablet (0.5 mg) twice daily with a meal. This is from [facility] bottle [facility].  After 1 week, then increase to 1 tablet (1 mg) twice daily with a meal. This is the one that is coming from your mail order pharmacy.   This medicine can cause nausea so it is very important to take with a meal.   Start working on decreasing how much you are smoking. QUIT DATE: March 1st  We will see you back in the office on March 2nd to follow up with how you are doing.

## 2021-04-08 NOTE — Progress Notes (Signed)
S:  Patient patient arrives for evaluation/assistance with tobacco dependence. Patient was referred and last seen by Primary Care Provider, Dr. Erin Hearing, on 04/07/21 and expressed interest in wanting to quit. He feels that now is the time to quit. He recently had bronchitis and was able to get down to 10 cigarettes/day but now that he has decided he wants to quit this has gone up to 15 cigarettes/day to "get it out while he can."  Age when started using tobacco on a daily basis: 66 years old. Smoked 1 ppd on average since then = 50 packyears.  Brand smoked: [place]ns. Number of cigarettes/day: 15 (previous 1 ppd).  Smokes at night on occasion if he wakes up to use the restroom, he may smoke part of a cigarette before going to bed.  Fagerstrom Score Question Scoring Patient Score  How soon after waking do you smoke your first cigarette? <5 mins (3) 5-30 mins (2) 31-60 mins (1) >60 mins (0) 2  Do you find it difficult NOT to smoke in places where you shouldn't? Yes(1) No (0) 0  Which cigarette would you most hate to give up? First one in AM (1)  Any other one (0)    0 - after dinner would be the hardest to give up   How many cigarettes do you smoke/day? 10 or less (0) 11-20 (1) 21-30 (2) >30 (3) 1  Do you smoke more during the first few hours after waking? Yes (1) No (0) 0  Do you smoke if you are so ill you cannot get out of bed? Yes (1) No (0) 1   Total Score 4  Score interpretation: low 1-2, low-to-moderate 3-4, moderate 5-7, high >7  Most recent quit attempt: last was quit 8 years ago when he successfully quit for about a week Longest time ever been tobacco free: 1 week.  Medications used in past cessation efforts include: none (previously quit because he was in a place he wasn't able to buy cigarettes). He has been prescribed nicotine patches in the past but did not really use them.   Rates IMPORTANCE of quitting tobacco on 1-10 scale of 10. Rates CONFIDENCE of quitting  tobacco on 1-10 scale of 8.  Most common triggers to use tobacco include: after a meal, other people around him smoking  Motivation to quit: health, grandchildren (1 granddaughter, 4 grandsons), says his sons don't think he can do it and he wants to prove them wrong  Barriers to quit: wife still smokes but he hopes she will be motivated to quit by his quit attempt  Clinical ASCVD: No  The ASCVD Risk score (Arnett DK, et al., 2019) failed to calculate for the following reasons:   Cannot find a previous HDL lab   Cannot find a previous total cholesterol lab  A/P: Tobacco use disorder with moderate nicotine dependence of 50 years duration in a patient who is an excellent candidate for success because of his motivation and confidence expressed during today's visit.    -Initiated varenicline 0.5 mg by mouth once daily with food x7 days, then 0.5 mg by mouth twice daily with food x7 days, then 1 mg by mouth twice daily with food thereafter. Patient counseled on purpose, proper use, and potential adverse effects, including GI upset. -Patient set quit date of March 1. He will work toward decreasing # cigarettes/day until then and on March 1st, get rid of his cigarettes, ash trays, etc.   Written information provided. F/U Rx Clinic Visit in  3 weeks on 04/29/21.  Total time in face-to-face counseling 46 minutes. Patient seen with Gala Murdoch, PharmD Candidate, and Rebbeca Paul, PharmD - PGY2 Pharmacy Resident.  Marland Kitchen

## 2021-04-08 NOTE — Progress Notes (Signed)
Reviewed: I agree with Dr. Koval's documentation and management. 

## 2021-04-08 NOTE — Assessment & Plan Note (Signed)
Tobacco use disorder with moderate nicotine dependence of 50 years duration in a patient who is an excellent candidate for success because of his motivation and confidence expressed during today's visit.    -Initiated varenicline 0.5 mg by mouth once daily with food x7 days, then 0.5 mg by mouth twice daily with food x7 days, then 1 mg by mouth twice daily with food thereafter. Patient counseled on purpose, proper use, and potential adverse effects, including GI upset. -Patient set quit date of March 1. He will work toward decreasing # cigarettes/day until then and on March 1st, get rid of his cigarettes, ash trays, etc.

## 2021-04-09 DIAGNOSIS — F1020 Alcohol dependence, uncomplicated: Secondary | ICD-10-CM | POA: Diagnosis not present

## 2021-04-12 DIAGNOSIS — F1020 Alcohol dependence, uncomplicated: Secondary | ICD-10-CM | POA: Diagnosis not present

## 2021-04-13 DIAGNOSIS — F1020 Alcohol dependence, uncomplicated: Secondary | ICD-10-CM | POA: Diagnosis not present

## 2021-04-14 DIAGNOSIS — F1020 Alcohol dependence, uncomplicated: Secondary | ICD-10-CM | POA: Diagnosis not present

## 2021-04-16 DIAGNOSIS — F1020 Alcohol dependence, uncomplicated: Secondary | ICD-10-CM | POA: Diagnosis not present

## 2021-04-29 ENCOUNTER — Ambulatory Visit: Payer: Medicare HMO | Admitting: Pharmacist

## 2021-05-18 ENCOUNTER — Encounter: Payer: Self-pay | Admitting: Family Medicine

## 2021-05-18 ENCOUNTER — Encounter: Payer: Self-pay | Admitting: Pharmacist

## 2021-05-18 ENCOUNTER — Ambulatory Visit (INDEPENDENT_AMBULATORY_CARE_PROVIDER_SITE_OTHER): Payer: Medicare HMO | Admitting: Pharmacist

## 2021-05-18 ENCOUNTER — Other Ambulatory Visit: Payer: Self-pay

## 2021-05-18 ENCOUNTER — Ambulatory Visit (INDEPENDENT_AMBULATORY_CARE_PROVIDER_SITE_OTHER): Payer: Medicare HMO | Admitting: Family Medicine

## 2021-05-18 VITALS — BP 143/82 | HR 61 | Wt 225.0 lb

## 2021-05-18 DIAGNOSIS — S86902S Unspecified injury of unspecified muscle(s) and tendon(s) at lower leg level, left leg, sequela: Secondary | ICD-10-CM

## 2021-05-18 DIAGNOSIS — M1A0790 Idiopathic chronic gout, unspecified ankle and foot, without tophus (tophi): Secondary | ICD-10-CM | POA: Diagnosis not present

## 2021-05-18 DIAGNOSIS — R350 Frequency of micturition: Secondary | ICD-10-CM | POA: Diagnosis not present

## 2021-05-18 DIAGNOSIS — Z720 Tobacco use: Secondary | ICD-10-CM | POA: Diagnosis not present

## 2021-05-18 DIAGNOSIS — Z23 Encounter for immunization: Secondary | ICD-10-CM | POA: Diagnosis not present

## 2021-05-18 NOTE — Progress Notes (Signed)
Reviewed: I agree with Dr. Koval's documentation and management. 

## 2021-05-18 NOTE — Progress Notes (Signed)
? ?S:  ?Patient arrives in good spirits and ambulating without assistance. Patient arrives for evaluation/assistance with tobacco dependence. Patient was referred and last seen by Primary Care Provider Dr. Erin Hearing on 04/07/2021.  ? ?Age when started using tobacco on a daily basis 66 years old. ?Brand smoked [place]. Number of cigarettes/day 6-8/day. Estimated nicotine content per cigarette (mg) 1 mg.  ?Estimated nicotine intake per day 6-8 mg.   ? ?Patient reports that he has not needed his albuterol inhaler recently, and he feels as though his breathing is better, especially at night. Patient states that his wife said he has not been coughing as much throughout the night. He also reports better appetite, sense of taste, and sense of smell. He says that the varenicline has been helping with the urges, and the hardest thing right now is changing the habit of smoking. He reports that he used to smoke a lot while watching TV in the evening, but he's hoping as the weather gets warmer, he will be able to go outside more in the evenings instead of sitting in front of the TV. The patient reports that he has bought what he believes is his last pack, and he is looking forward to being completely smoke-free. ? ?Patient reports having two gout flares in the past, with the most recent being about 8 months ago. The first time it happened, he reports that it felt like his foot was broken as it was very painful and he could not put any weight on it. He does state that he drinks beer some nights after getting off work.  ? ?Fagerstrom Score ?Question Scoring Patient Score  ?How soon after waking do you smoke your first cigarette? <5 mins (3) ?5-30 mins (2) ?31-60 mins (1) ?>60 mins (0)   ?Do you find it difficult NOT to smoke in places where you shouldn't? Yes(1) ?No (0)   ?Which cigarette would you most hate to give up? First one in AM (1) ? ?Any other one (0)  ? ? ?  ?How many cigarettes do you smoke/day? 10 or less (0) ?11-20  (1) ?21-30 (2) ?>30 (3)   ?Do you smoke more during the first few hours after waking? Yes (1) ?No (0)   ?Do you smoke if you are so ill you cannot get out of bed? Yes (1) ?No (0)   ? Total Score   ?Score interpretation: low 1-2, low-to-moderate 3-4, moderate 5-7, high >7 ? ?Most recent quit attempt: last was quit 8 years ago when he successfully quit for about a week ?Longest time ever been tobacco free: 1 week. ? ?Medications used in past cessation efforts include: None. He had a prescription for nicotine patches but did not really use them.  ? ?Rates IMPORTANCE of quitting tobacco on 1-10 scale of 10. ?Rates CONFIDENCE of quitting tobacco on 1-10 scale of 8. ? ?Most common triggers to use tobacco include; habit  ? ?Motivation to quit: health, proving to his sons that he can do it  ? ?Barriers to quitting: wife continues to smoke, but patient is hopeful that she will follow in his footsteps and quit after he has completely quit  ? ?A/P: ?Tobacco use disorder with moderate nicotine dependence of 50 years duration in a patient who is excellent candidate for success because of progress so far and confidence in quitting completely.    ?-Continue varenicline 1 mg by mouth twice daily with food. Patient counseled on making sure to take this with meals. ?Goal: down to  3-4 cigarettes/day by this Friday (05/21/2021) and then quit by the end of the month  ? ?History of gout with two reported flares in the past (none in the past year per patient report) ?-Consider uric acid assessment at the next clinic appointment / lab draw.  ?If uric acid remains > 6.0 ?-Consider changing lisinopril to losartan ?-Consider discontinuation of hydrochlorothiazide and change to alternate antihypertensive ? ?Written information provided.  F/U phone call during the first week of April to check in. ?Total time in face-to-face counseling 28 minutes.  Patient seen with Earvin Hansen PharmD Candidate and [name], PharmD, PGY 1 pharmacy  resident.  ?

## 2021-05-18 NOTE — Assessment & Plan Note (Signed)
Stable.  Will continue gabapentin and fentanyl patches.  He decided not to take any trazodone or antihistamine as perscribed by the [facility] for anxiety.  He does plan to see a therapist  ?

## 2021-05-18 NOTE — Patient Instructions (Addendum)
Nice to see you today! You've made great progress so far! ? ?Continue Chantix (varenicline) 1 mg twice daily with food. ?Goal: down to 3-4 cigarettes/day by this Friday (05/21/2021) and quit by the end of the month. ?

## 2021-05-18 NOTE — Patient Instructions (Signed)
Good to see you today - Thank you for coming in ? ?Things we discussed today: ? ?Urination ?Will put in a referral They should call you to schedule an appointment.  This could appear as an unknown number on your phone.  If you have not heard from them in 2 weeks please let me know.  ? ?Sleep ?See the therapist.  Let me know if you need other suggestions for therapy  ? ?Tobacco ?Keep going!! ? ?Leg pain ?Have [facility] notify me when you need refills ? ?Please always bring your medication bottles ? ?Come back to see me in 3 months ?

## 2021-05-18 NOTE — Assessment & Plan Note (Signed)
Tobacco use disorder with moderate nicotine dependence of 50 years duration in a patient who is excellent candidate for success because of progress so far and confidence in quitting completely.    ?-Continue varenicline 1 mg by mouth twice daily with food. Patient counseled on making sure to take this with meals. ?Goal: down to 3-4 cigarettes/day by this Friday (05/21/2021) and then quit by the end of the month  ? ?

## 2021-05-18 NOTE — Assessment & Plan Note (Signed)
History of gout with two reported flares in the past (none in the past year per patient report) ?-Consider uric acid assessment at the next clinic appointment / lab draw.  ?If uric acid remains > 6.0 ?-Consider changing lisinopril to losartan ?-Consider discontinuation of hydrochlorothiazide and change to alternate antihypertensive ?

## 2021-05-18 NOTE — Assessment & Plan Note (Signed)
Improving with pharmacy interventions.  Encouraged that he is making good progress ?

## 2021-05-18 NOTE — Progress Notes (Signed)
? ? ?  SUBJECTIVE:  ? ?CHIEF COMPLAINT / HPI:  ? ?Urinary frequency ?Gets up several times a night that interferes with his sleep.  Taking 2 tamsulosin a day, but did not bring in his medications.   No pain or hematuria  ?  ?Tobacco abuse ?Down to about 5 cigs with a quit date scheduled.  Taking chantix daily.  Saw Dr Valentina Lucks today.  He is very happy with his progress  ? ?Sequelae of injury of muscle and tendon of lower limb ?Continue Fentanyl patches.  Continues gabapentin 3 per day.  Not taking trazadone or hydroxyzine by report. ?  ? ?PERTINENT  PMH / PSH: Plans to see a counselor near his home  ? ?OBJECTIVE:  ? ?BP (!) 143/82   Pulse 61   Wt 225 lb (102.1 kg)   SpO2 97%   BMI 32.28 kg/m?   ?Alert conversant  ? ?ASSESSMENT/PLAN:  ? ?Urinary frequency ?Not improving with full dose tamsulosin.  His PSA has been up and down in the past.  Will refer to urology for further work up and treatment ? ? ?Tobacco abuse ?Improving with pharmacy interventions.  Encouraged that he is making good progress ? ?Sequelae of injury of muscle and tendon of lower limb ?Stable.  Will continue gabapentin and fentanyl patches.  He decided not to take any trazodone or antihistamine as perscribed by the [facility] for anxiety.  He does plan to see a therapist  ?  ? ? ?[name], MD ?[place]  ?

## 2021-05-18 NOTE — Assessment & Plan Note (Signed)
Not improving with full dose tamsulosin.  His PSA has been up and down in the past.  Will refer to urology for further work up and treatment ? ?

## 2021-06-02 ENCOUNTER — Telehealth: Payer: Self-pay | Admitting: Pharmacist

## 2021-06-02 NOTE — Telephone Encounter (Signed)
Attempted to contact patient for follow-up of tobacco intake reduction / cessation.  ? ? ?Left HIPAA compliant voice mail  ? ?Total time with patient call and documentation of interaction: 7 minutes. ? ?Additional F/U Phone call planned: 2 weeks ? ?

## 2021-06-02 NOTE — Telephone Encounter (Signed)
-----   Message from Leavy Cella, [place] sent at 05/18/2021  3:16 PM EDT ----- ?Regarding: Tobacco Cessation - Quit date ? ? ?

## 2021-06-18 ENCOUNTER — Telehealth: Payer: Self-pay | Admitting: Pharmacist

## 2021-06-18 DIAGNOSIS — Z720 Tobacco use: Secondary | ICD-10-CM

## 2021-06-18 NOTE — Telephone Encounter (Signed)
-----   Message from Leavy Cella, [place] sent at 06/02/2021  1:39 PM EDT ----- ?Regarding: Tobacco intake reduction / quit? ? ? ?

## 2021-06-18 NOTE — Assessment & Plan Note (Signed)
Patient contacted for follow/up of tobacco intake reduction / attempt.  ? ?Since last contact patient reports smoking ~ 4-5 per day and he has stopped taking varenicline (Chantix).   He states he is getting comfortable with only smoking 4-5 per day.  He reports a variety of triggers for smoking including stress, after meals, anxiety, first AM.  ? ?Medications currently being used; None but has Varenicline (Chantix) supply.  ?Patient denies any significant side effects from tobacco cessation therapy.  ? ?Rates IMPORTANCE of quitting tobacco as high.. ? ?Motivation to quit: health.  ? ?Following extended discussion, patient agreed that further work on cutting back and eventually quitting is something he would like to pursue.  He was willing to restart on varenicline.  We agreed that '1mg'$  once daily with food was an appropriate dose.   ?

## 2021-06-18 NOTE — Telephone Encounter (Signed)
Patient contacted for follow/up of tobacco intake reduction / attempt.  ? ?Since last contact patient reports smoking ~ 4-5 per day and he has stopped taking varenicline (Chantix).   He states he is getting comfortable with only smoking 4-5 per day.  He reports a variety of triggers for smoking including stress, after meals, anxiety, first AM.  ? ?Medications currently being used; None but has Varenicline (Chantix) supply.  ?Patient denies any significant side effects from tobacco cessation therapy.  ? ?Rates IMPORTANCE of quitting tobacco as high.. ? ?Motivation to quit: health.  ? ?Following extended discussion, patient agreed that further work on cutting back and eventually quitting is something he would like to pursue.  He was willing to restart on varenicline.  We agreed that '1mg'$  once daily with food was an appropriate dose.   ? ?Total time with patient call and documentation of interaction: 17 minutes. ? ?Patient selected F/U phone call planned: 2 weeks.  ? ? ?

## 2021-06-21 NOTE — Telephone Encounter (Signed)
Noted and agree. 

## 2021-07-20 ENCOUNTER — Other Ambulatory Visit: Payer: Self-pay | Admitting: Family Medicine

## 2021-07-21 MED ORDER — FENTANYL 25 MCG/HR TD PT72: 1.0000 | MEDICATED_PATCH | TRANSDERMAL | 0 refills | Status: DC

## 2021-08-04 ENCOUNTER — Encounter: Payer: Self-pay | Admitting: Family Medicine

## 2021-08-04 ENCOUNTER — Ambulatory Visit (INDEPENDENT_AMBULATORY_CARE_PROVIDER_SITE_OTHER): Payer: Medicare HMO | Admitting: Family Medicine

## 2021-08-04 DIAGNOSIS — F3289 Other specified depressive episodes: Secondary | ICD-10-CM | POA: Diagnosis not present

## 2021-08-04 DIAGNOSIS — S86902S Unspecified injury of unspecified muscle(s) and tendon(s) at lower leg level, left leg, sequela: Secondary | ICD-10-CM

## 2021-08-04 DIAGNOSIS — Z720 Tobacco use: Secondary | ICD-10-CM

## 2021-08-04 DIAGNOSIS — I10 Essential (primary) hypertension: Secondary | ICD-10-CM

## 2021-08-04 MED ORDER — FENTANYL 25 MCG/HR TD PT72: 1.0000 | MEDICATED_PATCH | TRANSDERMAL | 0 refills | Status: DC

## 2021-08-04 NOTE — Progress Notes (Signed)
    SUBJECTIVE:   CHIEF COMPLAINT / HPI:   Tobacco abuse Is not taking Chantix and back to smoking about 1/2 ppd.  He feels bad about this.  Does want to stop and plans to restart Chantix once his stressors reduce    Sequelae of injury of muscle and tendon of lower limb He feels this is fairly well controlled.  Worse with recent personal stressors involving his son.   PERTINENT  PMH / PSH: younger son has broken jaw from altercation with a friend.  He lives with patient   OBJECTIVE:   BP 128/60   Pulse 60   Ht '5\' 10"'$  (1.778 m)   Wt 218 lb 9.6 oz (99.2 kg)   SpO2 97%   BMI 31.37 kg/m   Appears depressed.   But able to discuss and put in perspective  Discussed stressors with his sons injury and with smoking cessation  ASSESSMENT/PLAN:   Essential hypertension Well controlled today Continue current medications   Depression Worsened likely due to son's recent injury.  He is not interested in counseling currently.  Will contact me if changes his mind   Sequelae of injury of muscle and tendon of lower limb Stable Continue fentanyl patches and gabapentin   Tobacco abuse Has regressed after good response with Chantix.  Discussed this could be a temporary setback and has helped with harm reduction.  Plans to retry once stressors are down    Patient Instructions  Good to see you today - Thank you for coming in  Things we discussed today:  Call me if worsening  When restart Chantix try 1/2 tab  Please always bring your medication bottles  Come back to see me in 2 months     [name], [place]

## 2021-08-04 NOTE — Assessment & Plan Note (Signed)
Worsened likely due to son's recent injury.  He is not interested in counseling currently.  Will contact me if changes his mind

## 2021-08-04 NOTE — Assessment & Plan Note (Signed)
Stable Continue fentanyl patches and gabapentin

## 2021-08-04 NOTE — Patient Instructions (Signed)
Good to see you today - Thank you for coming in  Things we discussed today:  Call me if worsening  When restart Chantix try 1/2 tab  Please always bring your medication bottles  Come back to see me in 2 months

## 2021-08-04 NOTE — Assessment & Plan Note (Signed)
Well controlled today. Continue current medications 

## 2021-08-04 NOTE — Assessment & Plan Note (Signed)
Has regressed after good response with Chantix.  Discussed this could be a temporary setback and has helped with harm reduction.  Plans to retry once stressors are down

## 2021-08-10 ENCOUNTER — Telehealth: Payer: Self-pay | Admitting: Pharmacist

## 2021-08-10 DIAGNOSIS — Z720 Tobacco use: Secondary | ICD-10-CM

## 2021-08-10 NOTE — Telephone Encounter (Signed)
-----   Message from Leavy Cella, [place] sent at 06/18/2021 12:59 PM EDT ----- Regarding: 4-5 per day and '1mg'$  daily varenicline - goal to be Less than 4 per day.

## 2021-08-10 NOTE — Assessment & Plan Note (Signed)
Patient contacted for follow/up of recent tobacco cessation attempt and stress induced relapse in smoking ~ 1/2 ppd.   Since last contact patient reports that he was able to cut down to 2-3 cigarettes per day with use of Chantix (varenicline).   Due to family and life stressors he is not ready to restart another quit attempt today but feels like he will try to restart Chantix and make another cessation attempt in the upcoming weeks.   Medications currently being used; None but has done well with Varenicline and plans to use when trying again to cut-down/quit soon.   Brief discussion to reinforce success he has made, and reinforce continued goal that should remain in his focus in the short-term.   He was appreciative of the support and encouragement.  Total time with patient call and documentation of interaction: 11 minutes.  F/U Phone call planned: 2 weeks  Patient selected f/u time when I hope to find patient has restarted varenicline.   Encourage for him to restart varenicline when he is ready.

## 2021-08-10 NOTE — Telephone Encounter (Signed)
Patient contacted for follow/up of recent tobacco cessation attempt and stress induced relapse in smoking ~ 1/2 ppd.   Since last contact patient reports that he was able to cut down to 2-3 cigarettes per day with use of Chantix (varenicline).   Due to family and life stressors he is not ready to restart another quit attempt today but feels like he will try to restart Chantix and make another cessation attempt in the upcoming weeks.   Medications currently being used; None but has done well with Varenicline and plans to use when trying again to cut-down/quit soon.   Brief discussion to reinforce success he has made, and reinforce continued goal that should remain in his focus in the short-term.   He was appreciative of the support and encouragement.  Total time with patient call and documentation of interaction: 11 minutes.  F/U Phone call planned: 2 weeks  Patient selected f/u time when I hope to find patient has restarted varenicline.   Encourage for him to restart varenicline when he is ready.

## 2021-08-19 ENCOUNTER — Other Ambulatory Visit: Payer: Self-pay

## 2021-08-20 MED ORDER — ALLOPURINOL 100 MG PO TABS: 100.0000 mg | ORAL_TABLET | Freq: Every day | ORAL | 1 refills | Status: DC

## 2021-08-24 ENCOUNTER — Telehealth: Payer: Self-pay | Admitting: Pharmacist

## 2021-08-24 NOTE — Telephone Encounter (Signed)
Patient contacted for follow/up of tobacco intake reduction attempt.   Since last contact patient reports stress continues and he does not feel like he is in a "good place" to quit at this time.   Medications currently being used; None Varenicline - plans to restart when motivated to quit.   Continues to rates IMPORTANCE of quitting tobacco as high.  Most common triggers to use tobacco include; stress   Total time with patient call and documentation of interaction: 11 minutes. Follow-up phone call planned: 4-5 weeks.

## 2021-09-28 ENCOUNTER — Other Ambulatory Visit: Payer: Self-pay

## 2021-09-28 ENCOUNTER — Encounter: Payer: Self-pay | Admitting: Family Medicine

## 2021-09-28 ENCOUNTER — Ambulatory Visit (INDEPENDENT_AMBULATORY_CARE_PROVIDER_SITE_OTHER): Payer: Medicare HMO | Admitting: Family Medicine

## 2021-09-28 DIAGNOSIS — R634 Abnormal weight loss: Secondary | ICD-10-CM

## 2021-09-28 DIAGNOSIS — I10 Essential (primary) hypertension: Secondary | ICD-10-CM | POA: Diagnosis not present

## 2021-09-28 NOTE — Patient Instructions (Addendum)
Good to see you today - Thank you for coming in  Things we discussed today:  Weight Loss  I will call you if your lab tests are not normal.  Otherwise we will discuss them at your next visit.  Your goal blood pressure is less than 140/90.  Check your blood pressure several times a week.  If regularly higher than this please let me know - either with MyChart or leaving a phone message. Next visit please bring in your blood pressure cuff.     See your eye doctor   Please always bring your medication bottles  Come back to see me in one month

## 2021-09-28 NOTE — Assessment & Plan Note (Signed)
No obvious causes on history physical.  Check labs.  High risk for cancer given his prolonged smoking.   If not improving may need CT

## 2021-09-28 NOTE — Assessment & Plan Note (Signed)
Reported episodes of low blood pressure.  Repeat today was normal.  Ask him to monitor at home and to brng in all his medications and cuff in one month

## 2021-09-28 NOTE — Progress Notes (Signed)
    SUBJECTIVE:   CHIEF COMPLAINT / HPI:   Weight Loss He is concerned he continues to loss weight without trying.  He is unsure why.  Does not feel his appetite is much changed, No diarrhea or vomiting, no fever.  Has his usual smokers cough.   No recreational drugs except occaisional beer and pot    Hypertension For a few days last week his blood pressure machine at home read low and he felt lightheadness when standing.  He held some of his blood pressure medications and then it improved and he has restarted them.  No nausea and vomiting or known dehydration or chest pain or unusual shortness of breath   Depression Overall feels much improved    PERTINENT  PMH / PSH: did not bring his medications or blood pressure machine  OBJECTIVE:   BP 128/82   Pulse 76   Wt 209 lb 12.8 oz (95.2 kg)   SpO2 94%   BMI 30.10 kg/m   Alert nad Mobility:able to get up and down from exam table without assistance or distress Heart - Regular rate and rhythm.  No murmurs, gallops or rubs.    Lungs:  Normal respiratory effort, chest expands symmetrically. Lungs are clear to auscultation, no crackles or wheezes. Abdomen: soft and non-tender without masses, organomegaly or hernias noted.  No guarding or rebound Extremities:  No cyanosis or unusual edema wearing chronic leg slevee Neck:  No deformities, thyromegaly, masses, or tenderness noted.   Supple with full range of motion without pain.  ASSESSMENT/PLAN:   Weight loss No obvious causes on history physical.  Check labs.  High risk for cancer given his prolonged smoking.   If not improving may need CT  Essential hypertension Reported episodes of low blood pressure.  Repeat today was normal.  Ask him to monitor at home and to brng in all his medications and cuff in one month      [name], [place]

## 2021-09-29 LAB — CBC
Hematocrit: 38.2 % (ref 37.5–51.0)
Hemoglobin: 13.1 g/dL (ref 13.0–17.7)
MCH: 32.7 pg (ref 26.6–33.0)
MCHC: 34.3 g/dL (ref 31.5–35.7)
MCV: 95 fL (ref 79–97)
Platelets: 266 10*3/uL (ref 150–450)
RBC: 4.01 x10E6/uL — ABNORMAL LOW (ref 4.14–5.80)
RDW: 12.2 % (ref 11.6–15.4)
WBC: 9.2 10*3/uL (ref 3.4–10.8)

## 2021-09-29 LAB — CMP14+EGFR
ALT: 17 IU/L (ref 0–44)
AST: 21 IU/L (ref 0–40)
Albumin/Globulin Ratio: 1.6 (ref 1.2–2.2)
Albumin: 4.1 g/dL (ref 3.9–4.9)
Alkaline Phosphatase: 60 IU/L (ref 44–121)
BUN/Creatinine Ratio: 24 (ref 10–24)
BUN: 23 mg/dL (ref 8–27)
Bilirubin Total: 0.3 mg/dL (ref 0.0–1.2)
CO2: 24 mmol/L (ref 20–29)
Calcium: 9.8 mg/dL (ref 8.6–10.2)
Chloride: 101 mmol/L (ref 96–106)
Creatinine, Ser: 0.94 mg/dL (ref 0.76–1.27)
Globulin, Total: 2.5 g/dL (ref 1.5–4.5)
Glucose: 112 mg/dL — ABNORMAL HIGH (ref 70–99)
Potassium: 5.5 mmol/L — ABNORMAL HIGH (ref 3.5–5.2)
Sodium: 139 mmol/L (ref 134–144)
Total Protein: 6.6 g/dL (ref 6.0–8.5)
eGFR: 89 mL/min/{1.73_m2} (ref >59)

## 2021-09-29 LAB — TSH: TSH: 2.82 u[IU]/mL (ref 0.450–4.500)

## 2021-10-01 ENCOUNTER — Telehealth: Payer: Self-pay | Admitting: Family Medicine

## 2021-10-01 DIAGNOSIS — I10 Essential (primary) hypertension: Secondary | ICD-10-CM

## 2021-10-01 NOTE — Telephone Encounter (Signed)
Patient returns call to nurse line.   Patient advised of elevated potassium and to avoid NSAIDS and salt substitutes.   Patient scheduled for repeat lab on 8/8.

## 2021-10-01 NOTE — Telephone Encounter (Signed)
Called left VM  If he calls back notify him of his mildly elevated K and that he should be sure not to take any NSAIDS or and salt substitute and come in for a repeat blood test next week  LC

## 2021-10-05 ENCOUNTER — Telehealth: Payer: Self-pay | Admitting: Family Medicine

## 2021-10-05 ENCOUNTER — Other Ambulatory Visit: Payer: Medicare HMO

## 2021-10-05 DIAGNOSIS — I10 Essential (primary) hypertension: Secondary | ICD-10-CM

## 2021-10-05 MED ORDER — ZOSTER VAC RECOMB ADJUVANTED 50 MCG/0.5ML IM SUSR: INTRAMUSCULAR | 1 refills | Status: DC

## 2021-10-05 NOTE — Telephone Encounter (Signed)
Spoke with him waiting area Had list of blood pressure with several < 90/60.  These were when he had been drinking beer He identified this himself and decided not to drink in the future Also wanted the shingles shot Will recheck his bmet today

## 2021-10-06 ENCOUNTER — Telehealth: Payer: Self-pay

## 2021-10-06 ENCOUNTER — Encounter: Payer: Self-pay | Admitting: Family Medicine

## 2021-10-06 LAB — BASIC METABOLIC PANEL
BUN/Creatinine Ratio: 17 (ref 10–24)
BUN: 25 mg/dL (ref 8–27)
CO2: 20 mmol/L (ref 20–29)
Calcium: 10 mg/dL (ref 8.6–10.2)
Chloride: 99 mmol/L (ref 96–106)
Creatinine, Ser: 1.48 mg/dL — ABNORMAL HIGH (ref 0.76–1.27)
Glucose: 159 mg/dL — ABNORMAL HIGH (ref 70–99)
Potassium: 4.4 mmol/L (ref 3.5–5.2)
Sodium: 138 mmol/L (ref 134–144)
eGFR: 52 mL/min/{1.73_m2} — ABNORMAL LOW (ref >59)

## 2021-10-06 NOTE — Telephone Encounter (Signed)
Patient calls nurse line to report continued low BP readings. Most recent BP was 103/64. 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   Spoke with Dr. Erin Hearing. Recommended that patient hold metoprolol dosage for today and to drink plenty of fluids. Reports that he has not had any alcohol since 10/01/21.  Patient verbalizes understanding of instructions and will continue to check BP and contact our office if he continues to have low BP or lightheadedness.   Talbot Grumbling, RN

## 2021-10-07 NOTE — Telephone Encounter (Addendum)
Patient returns call to nurse line. Patient reports that he had a missed call from our office and wanted to talk with Dr. Erin Hearing regarding his BP.   Reports that yesterday BP improved as the day went on. 2200- 120/65, 0200- 122/68 This AM: 123/72 120/69 122/72  Patient states that he has not taken metoprolol today and is asking if he should continue to not take this medication.   He also reports L sided temporal headache that started yesterday.He describes this as sharp.   Denies blurry vision, CP or SHOB.   He is requesting returned phone call at [phone]. He requests that if he does not answer that we leave a voicemail.   ED precautions discussed. Pt verbalizes understanding.   Talbot Grumbling, RN

## 2021-10-08 NOTE — Telephone Encounter (Signed)
Still has headache.  No visual changes or weakness just hurts in the sides Taking metoprolol once a day and all other medications the same BP ok no low  168/86 latest one  Recommend if headache becomes severe or any weakness or visual changes should go to ER If persists then call/come in early next week  Other wise keep taking blood pressure and let me know and bring in all medications and cuff for his scheduled visit

## 2021-10-17 ENCOUNTER — Ambulatory Visit (HOSPITAL_COMMUNITY)
Admission: EM | Admit: 2021-10-17 | Discharge: 2021-10-17 | Disposition: A | Discharge status: Home / Self Care | Payer: Medicare HMO | Attending: Internal Medicine | Admitting: Internal Medicine

## 2021-10-17 ENCOUNTER — Encounter (HOSPITAL_COMMUNITY): Payer: Self-pay | Admitting: Emergency Medicine

## 2021-10-17 DIAGNOSIS — L02511 Cutaneous abscess of right hand: Secondary | ICD-10-CM | POA: Diagnosis not present

## 2021-10-17 DIAGNOSIS — L02519 Cutaneous abscess of unspecified hand: Secondary | ICD-10-CM

## 2021-10-17 MED ORDER — DOXYCYCLINE HYCLATE 100 MG PO CAPS: 100.0000 mg | ORAL_CAPSULE | Freq: Two times a day (BID) | ORAL | 0 refills | Status: DC

## 2021-10-17 MED ORDER — ACETAMINOPHEN 500 MG PO TABS: 1000.0000 mg | ORAL_TABLET | Freq: Every day | ORAL | 0 refills | Status: AC | PRN

## 2021-10-17 MED ORDER — POVIDONE-IODINE 10 % EX SOLN
CUTANEOUS | Status: AC
  Filled 2021-10-17: qty 118

## 2021-10-17 MED ORDER — LIDOCAINE HCL (PF) 1 % IJ SOLN
INTRAMUSCULAR | Status: AC
  Filled 2021-10-17: qty 30

## 2021-10-17 NOTE — Discharge Instructions (Addendum)
We drained your cyst today in the clinic and left open to continue to drain.  Continue to use warm compresses to the wound to further encourage drainage from the wound.  You may do this in the shower as well with gentle compresses to the area to allow more infected material to drain.    Take doxycycline antibiotic twice daily for the next 10 days to treat infection to the cyst.  Change your dressings twice daily as the wound heals.  Do not apply any ointments, lotions, or powders to the wound.  Keep the wound covered and dry as it heals with a nonstick gauze dressing and Coban/Ace wrap.  You may take Tylenol/ibuprofen as needed for pain once the numbing wears off.  If you notice any worsening signs of infection such as redness, swelling, fever, or worsening drainage, please return to urgent care for reevaluation.
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
## 2021-10-17 NOTE — ED Triage Notes (Signed)
[place] spider bite. He's not sure when he obtained the initial injury, but that his right hand started bothering him around 5 days ago. Right hand appears swollen. The space in between his thumb and forefinger presents with an abscess.  States he cannot remember when his last tetanus shot was.

## 2021-10-20 ENCOUNTER — Ambulatory Visit: Payer: Medicare HMO | Admitting: Family Medicine

## 2021-10-20 NOTE — Progress Notes (Deleted)
    SUBJECTIVE:   CHIEF COMPLAINT / HPI:   Hypertension   Hand Abscess   Renal Insufficiency    PERTINENT  PMH / PSH: ***  OBJECTIVE:   There were no vitals taken for this visit.  ***  ASSESSMENT/PLAN:   No problem-specific Assessment & Plan notes found for this encounter.     [name], MD [place]

## 2021-10-22 ENCOUNTER — Other Ambulatory Visit: Payer: Self-pay

## 2021-10-22 NOTE — Patient Outreach (Signed)
[facility]) Care Management  10/22/2021  Andre Sherman 12-04-1955 [id number]   Telephone call to patient for nurse call.  No answer.  HIPAA compliant voice message left.    Plan: RN CM will attempt again within 4 business days and send letter.  Jone Baseman, RN, MSN [facility] Care Management Care Management Coordinator Direct Line [phone] Toll Free: [phone]  Fax: [phone]

## 2021-10-28 ENCOUNTER — Other Ambulatory Visit: Payer: Self-pay

## 2021-10-28 NOTE — Patient Outreach (Signed)
[facility]) Care Management  10/28/2021  Andre Sherman [birth year] [id number]   Telephone call to patient for nurse call.  No answer.  HIPAA compliant voice message left.    Plan: RN CM will attempt next week.  Jone Baseman, RN, MSN [facility] Care Management Care Management Coordinator Direct Line [phone] Toll Free: [phone]  Fax: [phone]

## 2021-10-29 ENCOUNTER — Other Ambulatory Visit: Payer: Self-pay

## 2021-10-29 NOTE — Patient Instructions (Signed)
Visit Information  Thank you for taking time to visit with me today. Please don't hesitate to contact me if I can be of assistance to you.   Following are the goals we discussed today:   Goals Addressed             This Visit's Progress    Keep blood pressure controlled       Care Coordination Interventions: Evaluation of current treatment plan related to hypertension self management and patient's adherence to plan as established by provider Discussed plans with patient for ongoing care management follow up and provided patient with direct contact information for care management team Advised patient, providing education and rationale, to monitor blood pressure daily and record, calling PCP for findings outside established parameters Reviewed scheduled/upcoming provider appointments including:  Appt 11/10/21 with PCP           Our next appointment is by telephone in December  Please call the care guide team at [phone] if you need to cancel or reschedule your appointment.   If you are experiencing a Mental Health or [place] or need someone to talk to, please call the Suicide and Crisis Lifeline: 988   The patient verbalized understanding of instructions, educational materials, and care plan provided today and DECLINED offer to receive copy of patient instructions, educational materials, and care plan.   Telephone follow up appointment with care management team member scheduled for: The patient has been provided with contact information for the care management team and has been advised to call with any health related questions or concerns.   Jone Baseman, RN, MSN [facility] Care Management Care Management Coordinator Direct Line [phone] Toll Free: [phone]  Fax: [phone]

## 2021-10-29 NOTE — Patient Outreach (Signed)
  Care Coordination   Initial Visit Note   10/29/2021 Name: Andre Sherman MRN: [id number] DOB: 30-Jun-1955  Andre Sherman is a 66 y.o. year old male who sees Chambliss, Jeb Levering, MD for primary care. I spoke with  Andre Sherman by phone today.  What matters to the patients health and wellness today?  Kepp blood pressure controlled.    Goals Addressed             This Visit's Progress    Keep blood pressure controlled       Care Coordination Interventions: Evaluation of current treatment plan related to hypertension self management and patient's adherence to plan as established by provider Discussed plans with patient for ongoing care management follow up and provided patient with direct contact information for care management team Advised patient, providing education and rationale, to monitor blood pressure daily and record, calling PCP for findings outside established parameters Reviewed scheduled/upcoming provider appointments including:  Appt 11/10/21 with PCP           SDOH assessments and interventions completed:  Yes     Care Coordination Interventions Activated:  Yes  Care Coordination Interventions:  Yes, provided   Follow up plan: Follow up call scheduled for December    Encounter Outcome:  Pt. Visit Completed   Jone Baseman, RN, MSN [place] [facility] Care Management Coordinator Direct Line [phone] Toll Free: [phone]  Fax: [phone]

## 2021-11-02 ENCOUNTER — Telehealth: Payer: Self-pay

## 2021-11-04 MED ORDER — METOPROLOL TARTRATE 25 MG PO TABS
50.0000 mg | ORAL_TABLET | Freq: Two times a day (BID) | ORAL | 2 refills | Status: DC
Start: 2021-11-04 — End: 2022-03-08

## 2021-11-04 NOTE — Telephone Encounter (Signed)
Patient calls nurse line checking the status of pain patches.  Patient reports he is completely out.   Will forward to PCP.

## 2021-11-05 MED ORDER — FENTANYL 25 MCG/HR TD PT72
1.0000 | MEDICATED_PATCH | TRANSDERMAL | 0 refills | Status: DC
Start: 2021-11-05 — End: 2021-12-06

## 2021-11-05 NOTE — Addendum Note (Signed)
Addended byWendy Poet, Brek Reece D on: 11/05/2021 04:25 PM   Modules accepted: Orders

## 2021-11-05 NOTE — Telephone Encounter (Signed)
Patient calls nurse line in regards to fentanyl patches.   Medication was refused by PCP due to needing an apt.   Patient has a scheduled apt with PCP on 9/13.  Patient reports he is completely out.   Apt reminder given.   Spoke with McDiarmid will forward to him for refill.

## 2021-11-08 ENCOUNTER — Telehealth: Payer: Self-pay | Admitting: Pharmacist

## 2021-11-08 DIAGNOSIS — Z720 Tobacco use: Secondary | ICD-10-CM

## 2021-11-08 NOTE — Telephone Encounter (Signed)
-----   Message from Leavy Cella, [place] sent at 08/24/2021  2:39 PM EDT ----- Regarding: Chantix - REstart ? Quit date?   How much smoking?

## 2021-11-08 NOTE — Telephone Encounter (Signed)
Patient contacted for follow/up of tobacco intake reduction / cessation attempt.   Since last contact patient reports he is now smoking 1 ppd due to his pain and anger.   Medications currently being used; None - stopped taking varenicline.  At this time, patient rates IMPORTANCE of quitting tobacco as minimal.  He shared that he feels like he NOW has only one person that cares for him as he thinks that Dr. Erin Hearing no longer cares for him.  He states this is due to his abrupt stopping of his fentanyl.   Most common triggers to use tobacco include; pain, depressed mood.   Smokes and watches TV all day.  Denies any interest in doing yard work.   Motivation to quit - none today.  Very upset and vented his disappointment for ~14: 00 minutes.   Total time with patient call and documentation of interaction: 18 minutes. Follow-up phone call planned: None at this time - TBD by Dr. Erin Hearing at his visit 9/13

## 2021-11-08 NOTE — Assessment & Plan Note (Signed)
Patient contacted for follow/up of tobacco intake reduction / cessation attempt.   Since last contact patient reports he is now smoking 1 ppd due to his pain and anger.   Medications currently being used; None - stopped taking varenicline.  At this time, patient rates IMPORTANCE of quitting tobacco as minimal.  He shared that he feels like he NOW has only one person that cares for him as he thinks that Dr. Erin Hearing no longer cares for him.  He states this is due to his abrupt stopping of his fentanyl.   Most common triggers to use tobacco include; pain, depressed mood.   Smokes and watches TV all day.  Denies any interest in doing yard work.   Motivation to quit - none today.  Very upset and vented his disappointment for ~14: 00 minutes

## 2021-11-09 ENCOUNTER — Telehealth: Payer: Self-pay | Admitting: Family Medicine

## 2021-11-09 NOTE — Telephone Encounter (Signed)
Left vm letting him know his medication was called in on 9/8 and that I hoped I saw him tomorrow

## 2021-11-09 NOTE — Telephone Encounter (Signed)
Noted and agree. 

## 2021-11-10 ENCOUNTER — Encounter: Payer: Self-pay | Admitting: Family Medicine

## 2021-11-10 ENCOUNTER — Ambulatory Visit (INDEPENDENT_AMBULATORY_CARE_PROVIDER_SITE_OTHER): Payer: Medicare HMO | Admitting: Family Medicine

## 2021-11-10 ENCOUNTER — Other Ambulatory Visit: Payer: Self-pay

## 2021-11-10 DIAGNOSIS — I10 Essential (primary) hypertension: Secondary | ICD-10-CM

## 2021-11-10 DIAGNOSIS — S86902S Unspecified injury of unspecified muscle(s) and tendon(s) at lower leg level, left leg, sequela: Secondary | ICD-10-CM

## 2021-11-10 MED ORDER — FENTANYL 25 MCG/HR TD PT72: 1.0000 | MEDICATED_PATCH | TRANSDERMAL | 0 refills | Status: DC

## 2021-11-10 MED ORDER — FENTANYL 25 MCG/HR TD PT72
1.0000 | MEDICATED_PATCH | TRANSDERMAL | 0 refills | Status: DC
Start: 2021-11-10 — End: 2021-12-06

## 2021-11-10 NOTE — Patient Instructions (Addendum)
Good to see you today - Thank you for coming in  Things we discussed today:  Pain I refilled your patches for the next 6 weeks  Blood pressure  Take one lisinopril daily Stop Tamsulosin and the Metoprolol  Your blood pressure should be between 140-100 over 90-60 Call me with your blood pressure readings on Friday   Please always bring your medication bottles  Come back to see me in one month
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
## 2021-11-10 NOTE — Assessment & Plan Note (Addendum)
Elevated today likely due to emotion and not taking any blood pressure medications.  Asked to take lisinopril hctz one pill daily and monitor Will hold his metoprolol and tamsulosin and his blood pressure at home seemed fairly controlled

## 2021-11-10 NOTE — Assessment & Plan Note (Signed)
Having been off of fentanyl for at least several days exhibiting some emotional lability.  Will restart patches as same frequency and monitor for adherence and return to his normal mental status

## 2021-11-15 ENCOUNTER — Telehealth: Payer: Self-pay

## 2021-11-15 NOTE — Telephone Encounter (Signed)
Patient calls nurse line reporting recent blood pressure readings.   Patient reports BP was elevated last week at office visit and was told to monitor and call back with some readings.   9/14: 113/80 and 115/62 9/15: 150/84 and 127/69 Today: 140/78 and 127/74  Will forward to PCP.

## 2021-11-19 NOTE — Telephone Encounter (Signed)
Feeling ok Blood pressures staying below 140 and in the 80s most of the time He will bring his cuff and medications to next visit

## 2021-12-06 ENCOUNTER — Ambulatory Visit (INDEPENDENT_AMBULATORY_CARE_PROVIDER_SITE_OTHER): Payer: Medicare HMO | Admitting: Family Medicine

## 2021-12-06 ENCOUNTER — Other Ambulatory Visit: Payer: Self-pay | Admitting: Family Medicine

## 2021-12-06 ENCOUNTER — Encounter: Payer: Self-pay | Admitting: Family Medicine

## 2021-12-06 ENCOUNTER — Other Ambulatory Visit: Payer: Self-pay

## 2021-12-06 VITALS — BP 141/87 | HR 82 | Wt 204.2 lb

## 2021-12-06 DIAGNOSIS — I10 Essential (primary) hypertension: Secondary | ICD-10-CM

## 2021-12-06 DIAGNOSIS — F3289 Other specified depressive episodes: Secondary | ICD-10-CM | POA: Diagnosis not present

## 2021-12-06 DIAGNOSIS — R634 Abnormal weight loss: Secondary | ICD-10-CM | POA: Diagnosis not present

## 2021-12-06 DIAGNOSIS — R739 Hyperglycemia, unspecified: Secondary | ICD-10-CM | POA: Diagnosis not present

## 2021-12-06 LAB — POCT GLYCOSYLATED HEMOGLOBIN (HGB A1C): HbA1c, POC (controlled diabetic range): 5.7 % (ref 0.0–7.0)

## 2021-12-06 MED ORDER — TRAZODONE HCL 50 MG PO TABS: 25.0000 mg | ORAL_TABLET | Freq: Every evening | ORAL | 1 refills | Status: DC | PRN

## 2021-12-06 MED ORDER — FENTANYL 25 MCG/HR TD PT72: 1.0000 | MEDICATED_PATCH | TRANSDERMAL | 0 refills | Status: DC

## 2021-12-06 MED ORDER — SERTRALINE HCL 50 MG PO TABS: 50.0000 mg | ORAL_TABLET | Freq: Every day | ORAL | 1 refills | Status: DC

## 2021-12-06 NOTE — Assessment & Plan Note (Signed)
Worsened with an anxiety component.  Start low dose ssri and low dose trazadone (given he is on gabapentin and fentanyl)  Need to have discussion about counseling.

## 2021-12-06 NOTE — Progress Notes (Signed)
    SUBJECTIVE:   CHIEF COMPLAINT / HPI:   Hypertension Forgot his medications and blood pressure cuff.   Has restarted all his medications - Metoprolol 50 mg (2 tabs) twice a day and lisinopril one tab in the AM.  Did not take his blood pressure medications this AM. No edema or shortness of breath or chest pain or lightheadness    Anxiety Insomnia Worsening over last few months.  Feels anxious during the day and worse at night with inability to sleep. Feels has racing thoughts about his son.  Not eating as much due to worry.  No suicidal ideation.  Has strong family history of depression and has been on amitriptyline in the past.     PERTINENT  PMH / PSH:   OBJECTIVE:   BP (!) 141/87   Pulse 82   Wt 204 lb 3.2 oz (92.6 kg)   SpO2 97%   BMI 29.30 kg/m   Haggard appearing Alert oriented knows his medications Heart - Regular rate and rhythm.  No murmurs, gallops or rubs.    Lungs:  Normal respiratory effort, chest expands symmetrically. Lungs are clear to auscultation, no crackles or wheezes.   ASSESSMENT/PLAN:   Essential hypertension Nearly at goal in office.  Monitor at home and bring in his medications and blood pressure cuff  Depression Worsened with an anxiety component.  Start low dose ssri and low dose trazadone (given he is on gabapentin and fentanyl)  Need to have discussion about counseling.     Weight loss Slightly worse.  He relates to not eating regularly.  Will follow up next month.  If persists will need to check labs and imaging      [name], [place]

## 2021-12-06 NOTE — Assessment & Plan Note (Signed)
Nearly at goal in office.  Monitor at home and bring in his medications and blood pressure cuff

## 2021-12-06 NOTE — Patient Instructions (Signed)
Good to see you today - Thank you for coming in  Things we discussed today:  BP Keep taking the medications as you are  Anxiety and Sleep Sertraline - 50 mg tab each morning Trazadone 1/2 tab an hour before you go to sleep  Call me when you need a refill of your patch   Please always bring your medication bottles  Come back to see me in one month

## 2021-12-06 NOTE — Assessment & Plan Note (Signed)
Slightly worse.  He relates to not eating regularly.  Will follow up next month.  If persists will need to check labs and imaging

## 2021-12-29 ENCOUNTER — Other Ambulatory Visit: Payer: Self-pay | Admitting: Family Medicine

## 2021-12-29 DIAGNOSIS — I10 Essential (primary) hypertension: Secondary | ICD-10-CM

## 2021-12-30 ENCOUNTER — Ambulatory Visit (INDEPENDENT_AMBULATORY_CARE_PROVIDER_SITE_OTHER): Payer: Medicare HMO | Admitting: Student

## 2021-12-30 ENCOUNTER — Other Ambulatory Visit: Payer: Self-pay

## 2021-12-30 ENCOUNTER — Telehealth: Payer: Self-pay

## 2021-12-30 ENCOUNTER — Emergency Department (HOSPITAL_COMMUNITY): Payer: Medicare HMO

## 2021-12-30 ENCOUNTER — Encounter (HOSPITAL_COMMUNITY): Payer: Self-pay

## 2021-12-30 ENCOUNTER — Inpatient Hospital Stay (HOSPITAL_COMMUNITY)
Admission: EM | Admit: 2021-12-30 | Discharge: 2022-01-01 | DRG: 540 | Disposition: A | Discharge status: Home / Self Care | Payer: Medicare HMO | Attending: Family Medicine | Admitting: Family Medicine

## 2021-12-30 VITALS — BP 112/78 | HR 78 | Wt 208.4 lb

## 2021-12-30 DIAGNOSIS — N400 Enlarged prostate without lower urinary tract symptoms: Secondary | ICD-10-CM | POA: Diagnosis present

## 2021-12-30 DIAGNOSIS — E785 Hyperlipidemia, unspecified: Secondary | ICD-10-CM | POA: Diagnosis not present

## 2021-12-30 DIAGNOSIS — M86079 Acute hematogenous osteomyelitis, unspecified ankle and foot: Secondary | ICD-10-CM | POA: Diagnosis not present

## 2021-12-30 DIAGNOSIS — I10 Essential (primary) hypertension: Secondary | ICD-10-CM | POA: Diagnosis present

## 2021-12-30 DIAGNOSIS — L03116 Cellulitis of left lower limb: Secondary | ICD-10-CM | POA: Diagnosis not present

## 2021-12-30 DIAGNOSIS — Z880 Allergy status to penicillin: Secondary | ICD-10-CM | POA: Diagnosis not present

## 2021-12-30 DIAGNOSIS — M869 Osteomyelitis, unspecified: Secondary | ICD-10-CM | POA: Diagnosis not present

## 2021-12-30 DIAGNOSIS — Z720 Tobacco use: Secondary | ICD-10-CM | POA: Diagnosis present

## 2021-12-30 DIAGNOSIS — Z79899 Other long term (current) drug therapy: Secondary | ICD-10-CM

## 2021-12-30 DIAGNOSIS — M109 Gout, unspecified: Secondary | ICD-10-CM | POA: Diagnosis not present

## 2021-12-30 DIAGNOSIS — F32A Depression, unspecified: Secondary | ICD-10-CM | POA: Diagnosis not present

## 2021-12-30 DIAGNOSIS — E1169 Type 2 diabetes mellitus with other specified complication: Secondary | ICD-10-CM | POA: Diagnosis not present

## 2021-12-30 DIAGNOSIS — E119 Type 2 diabetes mellitus without complications: Secondary | ICD-10-CM | POA: Diagnosis not present

## 2021-12-30 DIAGNOSIS — S91302A Unspecified open wound, left foot, initial encounter: Secondary | ICD-10-CM | POA: Insufficient documentation

## 2021-12-30 DIAGNOSIS — G629 Polyneuropathy, unspecified: Secondary | ICD-10-CM | POA: Diagnosis not present

## 2021-12-30 DIAGNOSIS — M86672 Other chronic osteomyelitis, left ankle and foot: Principal | ICD-10-CM | POA: Diagnosis present

## 2021-12-30 DIAGNOSIS — M86172 Other acute osteomyelitis, left ankle and foot: Secondary | ICD-10-CM | POA: Diagnosis not present

## 2021-12-30 DIAGNOSIS — B1920 Unspecified viral hepatitis C without hepatic coma: Secondary | ICD-10-CM | POA: Diagnosis not present

## 2021-12-30 DIAGNOSIS — S91102A Unspecified open wound of left great toe without damage to nail, initial encounter: Secondary | ICD-10-CM | POA: Diagnosis not present

## 2021-12-30 DIAGNOSIS — F17210 Nicotine dependence, cigarettes, uncomplicated: Secondary | ICD-10-CM | POA: Diagnosis not present

## 2021-12-30 DIAGNOSIS — M19072 Primary osteoarthritis, left ankle and foot: Secondary | ICD-10-CM | POA: Diagnosis not present

## 2021-12-30 DIAGNOSIS — J449 Chronic obstructive pulmonary disease, unspecified: Secondary | ICD-10-CM | POA: Diagnosis not present

## 2021-12-30 LAB — CBC WITH DIFFERENTIAL/PLATELET
Abs Immature Granulocytes: 0.03 10*3/uL (ref 0.00–0.07)
Basophils Absolute: 0.1 10*3/uL (ref 0.0–0.1)
Basophils Relative: 1 %
Eosinophils Absolute: 1.4 10*3/uL — ABNORMAL HIGH (ref 0.0–0.5)
Eosinophils Relative: 15 %
HCT: 40 % (ref 39.0–52.0)
Hemoglobin: 13.3 g/dL (ref 13.0–17.0)
Immature Granulocytes: 0 %
Lymphocytes Relative: 26 %
Lymphs Abs: 2.4 10*3/uL (ref 0.7–4.0)
MCH: 32.4 pg (ref 26.0–34.0)
MCHC: 33.3 g/dL (ref 30.0–36.0)
MCV: 97.3 fL (ref 80.0–100.0)
Monocytes Absolute: 0.7 10*3/uL (ref 0.1–1.0)
Monocytes Relative: 7 %
Neutro Abs: 4.5 10*3/uL (ref 1.7–7.7)
Neutrophils Relative %: 51 %
Platelets: 234 10*3/uL (ref 150–400)
RBC: 4.11 MIL/uL — ABNORMAL LOW (ref 4.22–5.81)
RDW: 12.4 % (ref 11.5–15.5)
WBC: 9.1 10*3/uL (ref 4.0–10.5)
nRBC: 0 % (ref 0.0–0.2)

## 2021-12-30 LAB — BASIC METABOLIC PANEL
Anion gap: 12 (ref 5–15)
BUN: 22 mg/dL (ref 8–23)
CO2: 24 mmol/L (ref 22–32)
Calcium: 9.6 mg/dL (ref 8.9–10.3)
Chloride: 101 mmol/L (ref 98–111)
Creatinine, Ser: 1.11 mg/dL (ref 0.61–1.24)
GFR, Estimated: >60 mL/min (ref >60)
Glucose, Bld: 100 mg/dL — ABNORMAL HIGH (ref 70–99)
Potassium: 4.4 mmol/L (ref 3.5–5.1)
Sodium: 137 mmol/L (ref 135–145)

## 2021-12-30 MED ORDER — LISINOPRIL 20 MG PO TABS
20.0000 mg | ORAL_TABLET | Freq: Every day | ORAL | Status: DC
  Administered 2021-12-31 – 2022-01-01 (×2): 20 mg via ORAL
  Filled 2021-12-30 (×2): qty 1

## 2021-12-30 MED ORDER — ALBUTEROL SULFATE (2.5 MG/3ML) 0.083% IN NEBU: 2.5000 mg | INHALATION_SOLUTION | Freq: Four times a day (QID) | RESPIRATORY_TRACT | Status: DC | PRN

## 2021-12-30 MED ORDER — HYDROCHLOROTHIAZIDE 25 MG PO TABS
25.0000 mg | ORAL_TABLET | Freq: Every day | ORAL | Status: DC
  Administered 2021-12-31 – 2022-01-01 (×2): 25 mg via ORAL
  Filled 2021-12-30 (×2): qty 1

## 2021-12-30 MED ORDER — GADOBUTROL 1 MMOL/ML IV SOLN
9.0000 mL | Freq: Once | INTRAVENOUS | Status: AC | PRN
Start: 2021-12-30 — End: 2021-12-30
  Administered 2021-12-30: 9 mL via INTRAVENOUS

## 2021-12-30 MED ORDER — METRONIDAZOLE 500 MG/100ML IV SOLN: 500.0000 mg | Freq: Two times a day (BID) | INTRAVENOUS | Status: DC

## 2021-12-30 MED ORDER — ALLOPURINOL 100 MG PO TABS
100.0000 mg | ORAL_TABLET | Freq: Every day | ORAL | Status: DC
  Administered 2021-12-31 – 2022-01-01 (×3): 100 mg via ORAL
  Filled 2021-12-30 (×3): qty 1

## 2021-12-30 MED ORDER — SODIUM CHLORIDE 0.9 % IV SOLN
2.0000 g | Freq: Three times a day (TID) | INTRAVENOUS | Status: DC
  Administered 2021-12-30 – 2022-01-01 (×5): 2 g via INTRAVENOUS
  Filled 2021-12-30 (×5): qty 12.5

## 2021-12-30 MED ORDER — GABAPENTIN 400 MG PO CAPS
1600.0000 mg | ORAL_CAPSULE | Freq: Every day | ORAL | Status: DC
  Administered 2021-12-30 – 2021-12-31 (×2): 1600 mg via ORAL
  Filled 2021-12-30 (×2): qty 4

## 2021-12-30 MED ORDER — TRAZODONE HCL 50 MG PO TABS
25.0000 mg | ORAL_TABLET | Freq: Every evening | ORAL | Status: DC | PRN
  Administered 2021-12-31: 50 mg via ORAL
  Filled 2021-12-30: qty 1

## 2021-12-30 MED ORDER — GABAPENTIN 400 MG PO CAPS
800.0000 mg | ORAL_CAPSULE | Freq: Every day | ORAL | Status: DC
  Administered 2021-12-31 – 2022-01-01 (×2): 800 mg via ORAL
  Filled 2021-12-30 (×2): qty 2

## 2021-12-30 MED ORDER — TAMSULOSIN HCL 0.4 MG PO CAPS
0.8000 mg | ORAL_CAPSULE | Freq: Every day | ORAL | Status: DC
  Administered 2021-12-31 – 2022-01-01 (×3): 0.8 mg via ORAL
  Filled 2021-12-30 (×3): qty 2

## 2021-12-30 MED ORDER — VANCOMYCIN HCL 2000 MG/400ML IV SOLN
2000.0000 mg | INTRAVENOUS | Status: DC
  Administered 2021-12-30 – 2021-12-31 (×2): 2000 mg via INTRAVENOUS
  Filled 2021-12-30 (×4): qty 400

## 2021-12-30 MED ORDER — LISINOPRIL-HYDROCHLOROTHIAZIDE 20-25 MG PO TABS: 1.0000 | ORAL_TABLET | Freq: Every day | ORAL | Status: DC

## 2021-12-30 MED ORDER — SERTRALINE HCL 50 MG PO TABS
50.0000 mg | ORAL_TABLET | Freq: Every day | ORAL | Status: DC
  Administered 2021-12-31 – 2022-01-01 (×3): 50 mg via ORAL
  Filled 2021-12-30 (×3): qty 1

## 2021-12-30 MED ORDER — ATORVASTATIN CALCIUM 40 MG PO TABS
40.0000 mg | ORAL_TABLET | Freq: Every day | ORAL | Status: DC
  Administered 2021-12-31 – 2022-01-01 (×3): 40 mg via ORAL
  Filled 2021-12-30 (×3): qty 1

## 2021-12-30 MED ORDER — FENTANYL 25 MCG/HR TD PT72: 1.0000 | MEDICATED_PATCH | TRANSDERMAL | Status: DC

## 2021-12-30 MED ORDER — ADULT MULTIVITAMIN W/MINERALS CH
1.0000 | ORAL_TABLET | Freq: Every day | ORAL | Status: DC
  Administered 2021-12-31 – 2022-01-01 (×3): 1 via ORAL
  Filled 2021-12-30 (×3): qty 1

## 2021-12-30 MED ORDER — ACETAMINOPHEN 325 MG PO TABS
650.0000 mg | ORAL_TABLET | Freq: Four times a day (QID) | ORAL | Status: DC | PRN
Start: 2021-12-30 — End: 2022-01-01
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
## 2021-12-30 NOTE — Patient Instructions (Signed)
Please go to the ER for concern for infection in your foot.  We will let our team know to expect you    Dr. Orvis Brill [facility] [phone]

## 2021-12-30 NOTE — Assessment & Plan Note (Signed)
66 year old male with 2-day history of wound of left foot with swelling, redness, bleeding. Given his history of osteomyelitis (Streptococcus) requiring second metatarsal amputation in 2017, in addition to tobacoo use and poor wound healing with decreased sensation, I have high clinical suspicion for acute osteomyelitis.  Also consider cellulitis, septic arthritis, gout although less likely. No sign of systemic infection/sepsis on exam- he is well-appearing with normal vital signs. Dr. Owens Shark evaluated the patient with me, and we shared our concerns with Andre Sherman with recommendation to go to the ER for further evaluation and management. He is agreeable and will drive himself over today.

## 2021-12-30 NOTE — Assessment & Plan Note (Signed)
BP well-controlled at today's visit. Has f/u with PCP on 11/8
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
## 2021-12-30 NOTE — ED Triage Notes (Signed)
Wound on 3 rd toe on the left and swelling.  Was sent here for evaluation.

## 2021-12-30 NOTE — Telephone Encounter (Signed)
Patient LVM on nurse line.  He reports he is at the ED waiting to be seen for imaging. He reports he would like to leave and go pick up antibiotics.   I reviewed note and it appears IV therapy was recommended.   I attempted to call patient to advise of ED stay. However, no answer or option for VM.

## 2021-12-30 NOTE — ED Provider Triage Note (Signed)
Emergency Medicine Provider Triage Evaluation Note  Andre Sherman , a 66 y.o. male  was evaluated in triage.  Pt complains of left right third toe wound.  Patient has a history of neuropathy.  Sent by PCP today over concern of osteomyelitis and soft tissue infection.  Patient had cut a hypertrophic toenail off at home causing a small wound.  No fevers.  Review of Systems  Positive:  Negative:   Physical Exam  BP 104/70 (BP Location: Left Arm)   Pulse 71   Temp 98.1 F (36.7 C) (Oral)   Resp 20   SpO2 91%  Gen:   Awake, no distress   Resp:  Normal effort  MSK:   Moves extremities without difficulty  Other:  Wound to the tip of the left third digit without extensive surrounding cellulitis  Medical Decision Making  Medically screening exam initiated at 3:25 PM.  Appropriate orders placed.  Andre Sherman was informed that the remainder of the evaluation will be completed by another provider, this initial triage assessment does not replace that evaluation, and the importance of remaining in the ED until their evaluation is complete.     Andre Cater, PA-C 12/30/21 1530
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
## 2021-12-30 NOTE — Assessment & Plan Note (Addendum)
VSS. No leukocytosis. Exam and imaging consistent with acute osteomyelitis of left 4th toe and chronic osteolysis of the left 3rd toe. Likely source is chronic wounds to L foot with overlying cellulitis. Pain currently controlled. Currently on IV cefepime and vanc. Bcx collected. - Podiatry consulted, appreciate recs, pending intervention this afternoon - Continue IV vanc and cefepime - F/u Bcx and narrow abx as able - Tylenol 650 mg Q6H prn  - Continue home gabapentin 800 in morning and 1600 QHS for pain - Monitor fever curve, vital signs -Replace fentanyl patch 11/4 in the evening

## 2021-12-31 ENCOUNTER — Encounter (HOSPITAL_COMMUNITY)
Admission: EM | Disposition: A | Discharge status: Home / Self Care | Payer: Self-pay | Source: Home / Self Care | Attending: Family Medicine

## 2021-12-31 ENCOUNTER — Encounter (HOSPITAL_COMMUNITY): Payer: Self-pay | Admitting: Certified Registered"

## 2021-12-31 DIAGNOSIS — M109 Gout, unspecified: Secondary | ICD-10-CM | POA: Diagnosis present

## 2021-12-31 DIAGNOSIS — F17210 Nicotine dependence, cigarettes, uncomplicated: Secondary | ICD-10-CM | POA: Diagnosis present

## 2021-12-31 DIAGNOSIS — J449 Chronic obstructive pulmonary disease, unspecified: Secondary | ICD-10-CM | POA: Diagnosis present

## 2021-12-31 DIAGNOSIS — F32A Depression, unspecified: Secondary | ICD-10-CM | POA: Diagnosis present

## 2021-12-31 DIAGNOSIS — I10 Essential (primary) hypertension: Secondary | ICD-10-CM

## 2021-12-31 DIAGNOSIS — B1920 Unspecified viral hepatitis C without hepatic coma: Secondary | ICD-10-CM | POA: Diagnosis present

## 2021-12-31 DIAGNOSIS — E785 Hyperlipidemia, unspecified: Secondary | ICD-10-CM | POA: Diagnosis present

## 2021-12-31 DIAGNOSIS — Z79899 Other long term (current) drug therapy: Secondary | ICD-10-CM | POA: Diagnosis not present

## 2021-12-31 DIAGNOSIS — Z880 Allergy status to penicillin: Secondary | ICD-10-CM | POA: Diagnosis not present

## 2021-12-31 DIAGNOSIS — Z720 Tobacco use: Secondary | ICD-10-CM

## 2021-12-31 DIAGNOSIS — L03116 Cellulitis of left lower limb: Secondary | ICD-10-CM | POA: Diagnosis present

## 2021-12-31 DIAGNOSIS — M86079 Acute hematogenous osteomyelitis, unspecified ankle and foot: Secondary | ICD-10-CM | POA: Diagnosis not present

## 2021-12-31 DIAGNOSIS — N400 Enlarged prostate without lower urinary tract symptoms: Secondary | ICD-10-CM | POA: Diagnosis present

## 2021-12-31 DIAGNOSIS — M86672 Other chronic osteomyelitis, left ankle and foot: Secondary | ICD-10-CM | POA: Diagnosis present

## 2021-12-31 DIAGNOSIS — M869 Osteomyelitis, unspecified: Secondary | ICD-10-CM | POA: Diagnosis present

## 2021-12-31 LAB — COMPREHENSIVE METABOLIC PANEL
ALT: 20 U/L (ref 0–44)
AST: 22 U/L (ref 15–41)
Albumin: 3.4 g/dL — ABNORMAL LOW (ref 3.5–5.0)
Alkaline Phosphatase: 40 U/L (ref 38–126)
Anion gap: 6 (ref 5–15)
BUN: 19 mg/dL (ref 8–23)
CO2: 26 mmol/L (ref 22–32)
Calcium: 8.5 mg/dL — ABNORMAL LOW (ref 8.9–10.3)
Chloride: 105 mmol/L (ref 98–111)
Creatinine, Ser: 1.03 mg/dL (ref 0.61–1.24)
GFR, Estimated: >60 mL/min (ref >60)
Glucose, Bld: 95 mg/dL (ref 70–99)
Potassium: 3.9 mmol/L (ref 3.5–5.1)
Sodium: 137 mmol/L (ref 135–145)
Total Bilirubin: 0.6 mg/dL (ref 0.3–1.2)
Total Protein: 6 g/dL — ABNORMAL LOW (ref 6.5–8.1)

## 2021-12-31 LAB — SURGICAL PCR SCREEN
MRSA, PCR: NEGATIVE
Staphylococcus aureus: NEGATIVE

## 2021-12-31 LAB — CBC
HCT: 39.6 % (ref 39.0–52.0)
Hemoglobin: 13.5 g/dL (ref 13.0–17.0)
MCH: 32.8 pg (ref 26.0–34.0)
MCHC: 34.1 g/dL (ref 30.0–36.0)
MCV: 96.1 fL (ref 80.0–100.0)
Platelets: 209 10*3/uL (ref 150–400)
RBC: 4.12 MIL/uL — ABNORMAL LOW (ref 4.22–5.81)
RDW: 12.7 % (ref 11.5–15.5)
WBC: 8.6 10*3/uL (ref 4.0–10.5)
nRBC: 0 % (ref 0.0–0.2)

## 2021-12-31 LAB — C-REACTIVE PROTEIN: CRP: <0.5 mg/dL (ref <1.0)

## 2021-12-31 LAB — SEDIMENTATION RATE: Sed Rate: 8 mm/hr (ref 0–16)

## 2021-12-31 SURGERY — AMPUTATION, TOE
Anesthesia: General | Site: Toe | Laterality: Left

## 2021-12-31 MED ORDER — FENTANYL CITRATE (PF) 250 MCG/5ML IJ SOLN
INTRAMUSCULAR | Status: AC
  Filled 2021-12-31: qty 5

## 2021-12-31 MED ORDER — INFLUENZA VAC A&B SA ADJ QUAD 0.5 ML IM PRSY
0.5000 mL | PREFILLED_SYRINGE | INTRAMUSCULAR | Status: DC
  Filled 2021-12-31: qty 0.5

## 2021-12-31 MED ORDER — MUPIROCIN 2 % EX OINT
1.0000 | TOPICAL_OINTMENT | Freq: Two times a day (BID) | CUTANEOUS | Status: DC
  Administered 2021-12-31 – 2022-01-01 (×2): 1 via NASAL
  Filled 2021-12-31: qty 22

## 2021-12-31 MED ORDER — NICOTINE 14 MG/24HR TD PT24
14.0000 mg | MEDICATED_PATCH | Freq: Every day | TRANSDERMAL | Status: DC
  Administered 2021-12-31 – 2022-01-01 (×2): 14 mg via TRANSDERMAL
  Filled 2021-12-31 (×2): qty 1

## 2021-12-31 MED ORDER — CHLORHEXIDINE GLUCONATE CLOTH 2 % EX PADS
6.0000 | MEDICATED_PAD | Freq: Once | CUTANEOUS | Status: AC
  Administered 2021-12-31: 6 via TOPICAL

## 2021-12-31 MED ORDER — PROPOFOL 10 MG/ML IV BOLUS
INTRAVENOUS | Status: AC
  Filled 2021-12-31: qty 20

## 2021-12-31 MED ORDER — MIDAZOLAM HCL 2 MG/2ML IJ SOLN
INTRAMUSCULAR | Status: AC
  Filled 2021-12-31: qty 2

## 2021-12-31 MED ORDER — FENTANYL 25 MCG/HR TD PT72: 1.0000 | MEDICATED_PATCH | TRANSDERMAL | Status: DC

## 2021-12-31 MED ORDER — ORAL CARE MOUTH RINSE: 15.0000 mL | OROMUCOSAL | Status: DC | PRN
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
## 2021-12-31 NOTE — Progress Notes (Signed)
Patient was discussed with the operating room today given concern for osteomyelitis.  I cannot evaluate the patient prior to surgery.  MRI was concerning for osteomyelitis of the left fourth toe however clinically there is no significant edema is no erythema or any open lesion.  He has a callus at the tip of the toe there is no drainage or pus.  There is no chronic osteolysis of the third toe.  There is some localized edema and very minimal erythema. Looks much better today than it does compared to the pictures from last night. There is callus present along this area.  There is no drainage or pus.  No fluctuance or crepitation.  Clinically does not appear to be infected.  Discussed with him surgical intervention to amputate the distal portion of the toes given the findings versus antibiotics long-term and watching this.  I had to get further information prior to proceed with surgery.  I will order a sed rate, CRP.  Also ABI as the pulses were somewhat decreased on exam today.   I discussed the findings with the patient and his family members at bedside at length today.  They prefer not to have the toes amputated if possible.  N.p.o. for tomorrow in case of surgery pending further testing.   Podiatry will continue to follow.
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
## 2021-12-31 NOTE — Assessment & Plan Note (Addendum)
-   Nicotine patch daily - Continue cessation counseling
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
## 2021-12-31 NOTE — ED Notes (Signed)
Patient resting with eyes closed, no s/s of distress, respirations even and unlabored, will continue to monitor.

## 2022-01-01 ENCOUNTER — Encounter (HOSPITAL_COMMUNITY): Payer: Medicare HMO

## 2022-01-01 DIAGNOSIS — M869 Osteomyelitis, unspecified: Secondary | ICD-10-CM | POA: Diagnosis not present

## 2022-01-01 LAB — CBC
HCT: 40.4 % (ref 39.0–52.0)
Hemoglobin: 13.1 g/dL (ref 13.0–17.0)
MCH: 31.7 pg (ref 26.0–34.0)
MCHC: 32.4 g/dL (ref 30.0–36.0)
MCV: 97.8 fL (ref 80.0–100.0)
Platelets: 218 10*3/uL (ref 150–400)
RBC: 4.13 MIL/uL — ABNORMAL LOW (ref 4.22–5.81)
RDW: 12.6 % (ref 11.5–15.5)
WBC: 8.3 10*3/uL (ref 4.0–10.5)
nRBC: 0 % (ref 0.0–0.2)

## 2022-01-01 LAB — BASIC METABOLIC PANEL
Anion gap: 4 — ABNORMAL LOW (ref 5–15)
BUN: 25 mg/dL — ABNORMAL HIGH (ref 8–23)
CO2: 25 mmol/L (ref 22–32)
Calcium: 9.4 mg/dL (ref 8.9–10.3)
Chloride: 108 mmol/L (ref 98–111)
Creatinine, Ser: 1.08 mg/dL (ref 0.61–1.24)
GFR, Estimated: >60 mL/min (ref >60)
Glucose, Bld: 97 mg/dL (ref 70–99)
Potassium: 4.5 mmol/L (ref 3.5–5.1)
Sodium: 137 mmol/L (ref 135–145)

## 2022-01-01 LAB — HIV ANTIBODY (ROUTINE TESTING W REFLEX): HIV Screen 4th Generation wRfx: NONREACTIVE

## 2022-01-01 MED ORDER — NICOTINE 14 MG/24HR TD PT24: 14.0000 mg | MEDICATED_PATCH | Freq: Every day | TRANSDERMAL | 0 refills | Status: DC

## 2022-01-01 MED ORDER — AMOXICILLIN-POT CLAVULANATE 875-125 MG PO TABS: 1.0000 | ORAL_TABLET | Freq: Two times a day (BID) | ORAL | 1 refills | Status: DC

## 2022-01-01 NOTE — Progress Notes (Signed)
     Daily Progress Note Intern Pager: [phone]  Patient name: Andre Sherman Medical record number: [id number] Date of birth: Oct 25, 1955 Age: 66 y.o. Gender: male  Primary Care Provider: [name], MD Consultants: Podiatry Code Status: Full  Pt Overview and Major Events to Date:  11/3 - Admitted with osteomyelitis of L 4th toe and osteolysis of L 3rd toe, podiatry consulted 11/4 - Plan for partial amputations of toes. Fentanyl patch to be replaced.  Assessment and Plan: Andre Sherman is a 66 y.o. that presented with left foot wound and swelling found to have osteomyelitis of the 4th toe, now undergoing  partial amputations. PMH   * Osteomyelitis ([place]) Osteomyelitis and osteolysis of left 3rd and 4th toes. Podiatry planning for partial toe amputations and patient has been NPO since midnight.  - Podiatry consulted, appreciate recs - Continue Cefepime and Vanc - Tylenol '650mg'$  q6h PRN - Replace fentanyl patch every 72h (replacing today)  Tobacco abuse - Nicotine patch daily - Continue cessation counseling   FEN/GI: NPO since [place] PPx: SCDs  Dispo:Pending PT recommendations  in 2-3 days. Barriers include surgical intervention with podiatry.   Subjective:  Patient reports that he is feeling fine this morning with no acute complaints. He is very glad that he ended up coming to the doctor to have his foot checked out and is good with moving forward with the surgeries as he wants to get rid of any possible infections.   Objective: Temp:  [97.7 F (36.5 C)-98.7 F (37.1 C)] 98.2 F (36.8 C) (11/04 0329) Pulse Rate:  [56-86] 73 (11/04 0329) Resp:  [12-17] 16 (11/04 0329) BP: (104-146)/(76-88) 146/82 (11/04 0329) SpO2:  [90 %-97 %] 90 % (11/04 0329) Physical Exam: General: NAD, supine in bed Cardiovascular: RRR, no murmur appreciated Respiratory: CTAB, breathing comfortably on room air Abdomen: soft, non-tender, non-distended Extremities: SCDs in place, Left  lower extremity wrapped. No purulent drainage from the foot wounds  Laboratory: Most recent CBC Lab Results  Component Value Date   WBC 8.3 01/01/2022   HGB 13.1 01/01/2022   HCT 40.4 01/01/2022   MCV 97.8 01/01/2022   PLT 218 01/01/2022   Most recent BMP    Latest Ref Rng & Units 01/01/2022    4:08 AM  BMP  Glucose 70 - 99 mg/dL 97   BUN 8 - 23 mg/dL 25   Creatinine 0.61 - 1.24 mg/dL 1.08   Sodium 135 - 145 mmol/L 137   Potassium 3.5 - 5.1 mmol/L 4.5   Chloride 98 - 111 mmol/L 108   CO2 22 - 32 mmol/L 25   Calcium 8.9 - 10.3 mg/dL 9.4      Karim Aiello, DO 01/01/2022, 5:57 AM  PGY-3, [place] Intern pager: [phone], text pages welcome Secure chat group [place]

## 2022-01-01 NOTE — Discharge Instructions (Signed)
Dear [name],  Thank you for letting us participate in your care. You were hospitalized for toe infection and diagnosed with Osteomyelitis ([place]). You were treated with IV antibiotics and sent home with oral antibiotics.   POST-HOSPITAL & CARE INSTRUCTIONS I am sending you in a prescription for Augmentin, this is a medication you take twice a day.  You will need to follow-up with your podiatrist outpatient to determine further action. If you start to develop fever, chills, warmth or redness, drainage from your feet please return to the emergency room for evaluation. Go to your follow up appointments (listed below)   DOCTOR'S APPOINTMENT   Future Appointments  Date Time Provider [place]  01/05/2022 10:50 AM Erin Hearing, Jeb Levering, MD [facility] [place]     Take care and be well!  [facility]  [place], [place] [address] [phone]

## 2022-01-01 NOTE — Plan of Care (Signed)
  Problem: Education: Goal: Knowledge of General Education information will improve Description: Including pain rating scale, medication(s)/side effects and non-pharmacologic comfort measures Outcome: Progressing   Problem: Health Behavior/Discharge Planning: Goal: Ability to manage health-related needs will improve Outcome: Progressing   Problem: Clinical Measurements: Goal: Ability to maintain clinical measurements within normal limits will improve Outcome: Progressing   Problem: Nutrition: Goal: Adequate nutrition will be maintained Outcome: Progressing   Problem: Coping: Goal: Level of anxiety will decrease Outcome: Progressing   Problem: Safety: Goal: Ability to remain free from injury will improve Outcome: Progressing   Problem: Skin Integrity: Goal: Risk for impaired skin integrity will decrease Outcome: Progressing

## 2022-01-01 NOTE — Progress Notes (Signed)
PODIATRY PROGRESS NOTE  NAME Andre Sherman MRN [id number] DOB 03/13/1955 DOA 12/30/2021   Reason for consult:  Chief Complaint  Patient presents with   Foot Pain     History of present illness: 66 y.o. male admitted for concern of infection initially for culture.  This started after he tried to remove the toenail self at home.  After that is when the toe started become red and swollen.  He has had a chronic callus at the tip of the fourth toe as well but no drainage or pus that he reports.  MRI was performed which concerning for possible osteomyelitis of the fourth toe and chronic osteolysis of the third digit.  Podiatry was consulted surgery initially scheduled for yesterday however pending evaluation fourth toe clinically.  Extremity drainage or signs of infection.  There is still some mild redness at the tip of the third toe but appear to be improved compared to the pictures from when he was admitted.  Vitals:   12/31/21 2117 01/01/22 0329  BP: 131/88 (!) 146/82  Pulse: 65 73  Resp: 16 16  Temp: 97.9 F (36.6 C) 98.2 F (36.8 C)  SpO2: 97% 90%       Latest Ref Rng & Units 01/01/2022    4:08 AM 12/31/2021    5:57 AM 12/30/2021    3:30 PM  CBC  WBC 4.0 - 10.5 K/uL 8.3  8.6  9.1   Hemoglobin 13.0 - 17.0 g/dL 13.1  13.5  13.3   Hematocrit 39.0 - 52.0 % 40.4  39.6  40.0   Platelets 150 - 400 K/uL 218  209  234        Latest Ref Rng & Units 01/01/2022    4:08 AM 12/31/2021    5:57 AM 12/30/2021    3:30 PM  BMP  Glucose 70 - 99 mg/dL 97  95  100   BUN 8 - 23 mg/dL '25  19  22   '$ Creatinine 0.61 - 1.24 mg/dL 1.08  1.03  1.11   Sodium 135 - 145 mmol/L 137  137  137   Potassium 3.5 - 5.1 mmol/L 4.5  3.9  4.4   Chloride 98 - 111 mmol/L 108  105  101   CO2 22 - 32 mmol/L '25  26  24   '$ Calcium 8.9 - 10.3 mg/dL 9.4  8.5  9.6       Physical Exam: General: AAOx3, NAD  Dermatology: The distal aspect of the left fourth toe is a thick hyperkeratotic lesion.  Upon debridement the  area is preulcerative but there is no drainage or pus noted.  There is no cellulitis noted to the fourth toe.  Her tenderness Is present from her toenail.  I was able to debride this once medically.  There is no purulence.  There is no fluctuance or crepitation.  Mild residual edema in the slight erythema to the distal portion of the toe.       Vascular: Pulses palpable today.  Neurological: Sensation intact with light touch  Musculoskeletal Exam: Chronic hammertoe deformities are noted particularly on the morning noted at the level of the DIPJ.    ASSESSMENT/PLAN OF CARE Resolving cellulitis, concern for possible osteomyelitis  Again reviewed the MRI of the patient.  Fourth toe suspicious for osteomyelitis however clinically does not appear to have acute infection.  Chronic osteolysis of the distal phalanx of the third toe which could be due to the deformity of the toe.  This infection  started after he tried to remove the toenail.  He cannot localize cellulitis is resolving with antibiotics.  His white blood cell count within normal limits as well as a sed rate, CRP.  Other lab findings think that he has an acute osteomyelitis.  We discussed proceeding with a partial toe amputations versus watching this in the continuing of antibiotics.  After discussion we discussed surgery and the postoperative course.  He states this can be difficult for him to stay off of his feet.  During long discussion we decided to hold off on surgery and discharged home with oral antibiotic likely Augmentin and close follow-up.  Discontinue n.p.o. for today.  I will see him in the office later this week for follow-up evaluation.  Advised him not to be cutting his toenails or calluses himself at home.  Recommend routine debridement in the office.  In the future may need to consider some intervention for the digital deformity, hammertoes.  I did order ABI last night however he does have palpable pulses.  We discussed return  precautions.  If this were to recur or persist he still may end up with amputations which she is very well aware of.     Please contact me directly with any questions or concerns.     Celesta Gentile, DPM [facility]  Dr. Bonna Gains. [address], [address], [place] [address]                Office [phone]  Fax [phone]
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
## 2022-01-02 ENCOUNTER — Telehealth: Payer: Self-pay | Admitting: Podiatry

## 2022-01-02 NOTE — Telephone Encounter (Signed)
Patient was discharged from [facility] on Saturday and will need a 1 week follow up. Can you please schedule? Thanks!

## 2022-01-03 ENCOUNTER — Telehealth: Payer: Self-pay

## 2022-01-03 NOTE — Patient Outreach (Signed)
  Care Coordination TOC Note Transition Care Management Follow-up Telephone Call Date of discharge and from where: [facility] 12/30/21-01/01/22 How have you been since you were released from the hospital? "I am doing great, my toe does not hurt and I am able to wear a shoe". Any questions or concerns? Yes- Unsure who he needs to see from Podiatry.  Provided Dr. Willia Craze name and phone number.  Items Reviewed: Did the pt receive and understand the discharge instructions provided? Yes  Medications obtained and verified? Yes  Other? No  Any new allergies since your discharge? No  Dietary orders reviewed? No Do you have support at home? Yes   Home Care and Equipment/Supplies: Were home health services ordered? not applicable If so, what is the name of the agency? N/A  Has the agency set up a time to come to the patient's home? not applicable Were any new equipment or medical supplies ordered?  No What is the name of the medical supply agency? N/A Were you able to get the supplies/equipment? not applicable Do you have any questions related to the use of the equipment or supplies? No  Functional Questionnaire: (I = Independent and D = Dependent) ADLs: I  Bathing/Dressing- I  Meal Prep- I  Eating- I  Maintaining continence- I  Transferring/Ambulation- I  Managing Meds- I  Follow up appointments reviewed:  PCP Hospital f/u appt confirmed? Yes  Scheduled to see Dr. Erin Hearing on 01/05/22 @ 10:50.Marland Kitchen [facility] f/u appt confirmed? No   Are transportation arrangements needed? No  If their condition worsens, is the pt aware to call PCP or go to the Emergency Dept.? Yes Was the patient provided with contact information for the PCP's office or ED? Yes Was to pt encouraged to call back with questions or concerns? Yes  SDOH assessments and interventions completed:   Yes  Care Coordination Interventions Activated:  Yes   Care Coordination Interventions:  Referred for Care  Coordination Services:  Clinical Social Work   Encounter Outcome:  Pt. Visit Completed

## 2022-01-03 NOTE — Patient Outreach (Signed)
  Care Coordination [facility] Note Transition Care Management Unsuccessful Follow-up Telephone Call  Date of discharge and from [facility] 12[facility]30[facility]21-01/01/22  Attempts:  1st Attempt  Reason for unsuccessful TCM follow-up call:  No answer/busy  Johnney Killian, RN, BSN, CCM Care Management Coordinator [facility] Phone: [phone]: [phone]

## 2022-01-03 NOTE — Patient Outreach (Signed)
  Care Coordination   Initial Visit Note   01/03/2022 Name: KINNIE KAUPP MRN: [id number] DOB: [birth year]  HAYVEN FATIMA is a 66 y.o. year old male who sees Chambliss, Jeb Levering, MD for primary care. I spoke with  [name] by phone today.  What matters to the patients health and wellness today?  "I would like to speak to a Education officer, museum about an issue I am having with my teeth and money"    Goals Addressed   None     SDOH assessments and interventions completed:  Yes     Care Coordination Interventions Activated:  Yes  Care Coordination Interventions:  Yes, provided   Follow up plan: Referral made to MSW    Encounter Outcome:  Pt. Scheduled

## 2022-01-04 ENCOUNTER — Ambulatory Visit: Payer: Self-pay | Admitting: Licensed Clinical Social Worker

## 2022-01-04 ENCOUNTER — Encounter: Payer: Self-pay | Admitting: Licensed Clinical Social Worker

## 2022-01-04 NOTE — Progress Notes (Unsigned)
    SUBJECTIVE:   CHIEF COMPLAINT / HPI:   Foot Infection Taking augmentin twice a day.  No fevers or pain.  Dressing it daily. No increased redness or discharge. Has not be contacted for an appointment and was unsure who the podiatrist is to follow up.  Anxiety Depression Feels this is much improved with better sleep and mood  Weight Loss Eating normally.  Happy with his current weight  Hypertension Did not take his blood pressure medication until minutes before his appointment.  Feels it is running in a good range at home when he takes it with home cuff.  Does have intermittent lightheadness if stands quickly    OBJECTIVE:   BP (!) 144/102   Pulse 89   Wt 207 lb 3.2 oz (94 kg)   SpO2 95%   BMI 29.73 kg/m   Psych:  Cognition and judgment appear intact. Alert, communicative  and cooperative with normal attention span and concentration. No apparent delusions, illusions, hallucinations L foot was in his compression stocking that is difficult to remove.  Was clean anddry   ASSESSMENT/PLAN:   Essential hypertension Assessment & Plan: Not at goal, likely due to just taking his medication today.  Ask him to Monitor at home    Acute hematogenous osteomyelitis of foot, unspecified laterality ([place]) Assessment & Plan: Stable.  He will follow up with Podiatry this week.  Continue antibiotics    Weight loss Assessment & Plan: Stable.  No concerning findings    Other depression Assessment & Plan: Improved on sertraline and trazadone.  Will continue    Other orders -     fentaNYL; Place 1 patch onto the skin every 3 (three) days.  Dispense: 5 patch; Refill: 0 -     fentaNYL; Place 1 patch onto the skin every 3 (three) days.  Dispense: 5 patch; Refill: 0 -     fentaNYL; Place 5 patches onto the skin every 3 (three) days. Fill 4 weeks after write date  Dispense: 5 patch; Refill: 0 -     fentaNYL; Place 5 patches onto the skin every 3 (three) days. Fill 6 weeks from write date   Dispense: 5 patch; Refill: 0     Patient Instructions  Good to see you today - Thank you for coming in  Things we discussed today:  Make an appointment with Dr Celesta Gentile  Call me if you can't get an appointment this week   Your goal blood pressure is less than 140/90.  Check your blood pressure several times a week.  If regularly higher than this please let me know - either with MyChart or leaving a phone message. Next visit please bring in your blood pressure cuff.     Please always bring your medication bottles  Come back to see me in 2 months    [name], MD [place]

## 2022-01-04 NOTE — Patient Outreach (Signed)
  Care Coordination   01/04/2022 Name: Andre Sherman MRN: [id number] DOB: 02-Feb-1956   Care Coordination Outreach Attempts:  An unsuccessful telephone outreach was attempted today to offer the patient information about available care coordination services as a benefit of their health plan.   Follow Up Plan:  Additional outreach attempts will be made to offer the patient care coordination information and services.   Encounter Outcome:  No Answer  Care Coordination Interventions Activated:  No   Care Coordination Interventions:  No, not indicated    Milus Height, BSW, MSW, [place]  Social Worker [facility] Care Management  [phone]

## 2022-01-05 ENCOUNTER — Ambulatory Visit (INDEPENDENT_AMBULATORY_CARE_PROVIDER_SITE_OTHER): Payer: Medicare HMO | Admitting: Family Medicine

## 2022-01-05 ENCOUNTER — Other Ambulatory Visit: Payer: Self-pay

## 2022-01-05 ENCOUNTER — Encounter: Payer: Self-pay | Admitting: Family Medicine

## 2022-01-05 VITALS — BP 144/102 | HR 89 | Wt 207.2 lb

## 2022-01-05 DIAGNOSIS — M86079 Acute hematogenous osteomyelitis, unspecified ankle and foot: Secondary | ICD-10-CM | POA: Diagnosis not present

## 2022-01-05 DIAGNOSIS — I10 Essential (primary) hypertension: Secondary | ICD-10-CM | POA: Diagnosis not present

## 2022-01-05 DIAGNOSIS — R634 Abnormal weight loss: Secondary | ICD-10-CM

## 2022-01-05 DIAGNOSIS — F3289 Other specified depressive episodes: Secondary | ICD-10-CM

## 2022-01-05 LAB — CULTURE, BLOOD (ROUTINE X 2)
Culture: NO GROWTH
Culture: NO GROWTH

## 2022-01-05 MED ORDER — FENTANYL 25 MCG/HR TD PT72: 1.0000 | MEDICATED_PATCH | TRANSDERMAL | 0 refills | Status: DC

## 2022-01-05 MED ORDER — FENTANYL 25 MCG/HR TD PT72: 5.0000 | MEDICATED_PATCH | TRANSDERMAL | 0 refills | Status: DC

## 2022-01-05 NOTE — Assessment & Plan Note (Signed)
Improved on sertraline and trazadone.  Will continue

## 2022-01-05 NOTE — Assessment & Plan Note (Signed)
Not at goal, likely due to just taking his medication today.  Ask him to Monitor at home

## 2022-01-05 NOTE — Assessment & Plan Note (Signed)
Stable.  No concerning findings

## 2022-01-05 NOTE — Assessment & Plan Note (Signed)
Stable.  He will follow up with Podiatry this week.  Continue antibiotics

## 2022-01-05 NOTE — Patient Instructions (Signed)
Good to see you today - Thank you for coming in  Things we discussed today:  Make an appointment with Dr Celesta Gentile  Call me if you can't get an appointment this week   Your goal blood pressure is less than 140/90.  Check your blood pressure several times a week.  If regularly higher than this please let me know - either with MyChart or leaving a phone message. Next visit please bring in your blood pressure cuff.     Please always bring your medication bottles  Come back to see me in 2 months

## 2022-01-06 ENCOUNTER — Other Ambulatory Visit: Payer: Self-pay

## 2022-01-06 MED ORDER — ATORVASTATIN CALCIUM 40 MG PO TABS: 40.0000 mg | ORAL_TABLET | Freq: Every day | ORAL | 3 refills | Status: DC

## 2022-01-10 ENCOUNTER — Ambulatory Visit: Payer: Self-pay | Admitting: Licensed Clinical Social Worker

## 2022-01-10 NOTE — Patient Outreach (Signed)
  Care Coordination   01/10/2022 Name: Andre Sherman MRN: [id number] DOB: 12-Mar-1955   Care Coordination Outreach Attempts:  A third unsuccessful outreach was attempted today to offer the patient with information about available care coordination services as a benefit of their health plan.   Follow Up Plan:  No further outreach attempts will be made at this time. We have been unable to contact the patient to offer or enroll patient in care coordination services  Encounter Outcome:  No Answer  Care Coordination Interventions Activated:  No   Care Coordination Interventions:  No, not indicated    Milus Height, [place] Worker [facility] Care Management  [phone]

## 2022-01-11 ENCOUNTER — Ambulatory Visit (INDEPENDENT_AMBULATORY_CARE_PROVIDER_SITE_OTHER): Payer: Medicare HMO | Admitting: Podiatry

## 2022-01-11 ENCOUNTER — Ambulatory Visit (INDEPENDENT_AMBULATORY_CARE_PROVIDER_SITE_OTHER): Payer: Medicare HMO

## 2022-01-11 DIAGNOSIS — M86079 Acute hematogenous osteomyelitis, unspecified ankle and foot: Secondary | ICD-10-CM

## 2022-01-11 DIAGNOSIS — M2042 Other hammer toe(s) (acquired), left foot: Secondary | ICD-10-CM

## 2022-01-11 DIAGNOSIS — M2041 Other hammer toe(s) (acquired), right foot: Secondary | ICD-10-CM

## 2022-01-11 DIAGNOSIS — R102 Pelvic and perineal pain: Secondary | ICD-10-CM

## 2022-01-13 ENCOUNTER — Telehealth: Payer: Self-pay | Admitting: Pharmacist

## 2022-01-13 DIAGNOSIS — Z720 Tobacco use: Secondary | ICD-10-CM

## 2022-01-13 NOTE — Telephone Encounter (Signed)
-----   Message from Leavy Cella, [place] sent at 12/15/2021 12:05 PM EDT ----- Regarding: Tobacco Cessation Interest?

## 2022-01-13 NOTE — Telephone Encounter (Signed)
Noted and agree. 

## 2022-01-13 NOTE — Assessment & Plan Note (Signed)
Patient contacted for follow/up of tobacco intake reduction / cessation attempt.   Since last contact patient reports smoking "a lot".  Briefly discussed his foot wound which he reported was looking better and shared that his podiatry visit went well.  Also discussed benefit of quitting smoking on wound healing.   Medications currently being used;  None Varenicline - has  supply at home  Continues to rates IMPORTANCE of quitting tobacco as high.  Stating "I have been thinking a lot about quitting"  Motivation to quit: health/breathing/healing of foot wound  Agreed on a plan to restart varenicline with goal of reducing intake to "single digit number" in the next two weeks.

## 2022-01-13 NOTE — Telephone Encounter (Signed)
Patient contacted for follow/up of tobacco intake reduction / cessation attempt.   Since last contact patient reports smoking "a lot".  Briefly discussed his foot wound which he reported was looking better and shared that his podiatry visit went well.  Also discussed benefit of quitting smoking on wound healing.   Medications currently being used;  None Varenicline - has  supply at home  Continues to rates IMPORTANCE of quitting tobacco as high.  Stating "I have been thinking a lot about quitting"  Motivation to quit: health/breathing/healing of foot wound  Agreed on a plan to restart varenicline with goal of reducing intake to "single digit number" in the next two weeks.   Total time with patient call and documentation of interaction: 12 minutes. Follow-up phone call planned: 2 weeks

## 2022-01-16 ENCOUNTER — Other Ambulatory Visit: Payer: Self-pay | Admitting: Family Medicine
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
## 2022-01-24 ENCOUNTER — Other Ambulatory Visit: Payer: Self-pay | Admitting: Podiatry

## 2022-01-24 DIAGNOSIS — M86079 Acute hematogenous osteomyelitis, unspecified ankle and foot: Secondary | ICD-10-CM

## 2022-01-24 DIAGNOSIS — M2041 Other hammer toe(s) (acquired), right foot: Secondary | ICD-10-CM

## 2022-01-25 ENCOUNTER — Other Ambulatory Visit: Payer: Self-pay | Admitting: Family Medicine

## 2022-01-31 ENCOUNTER — Telehealth: Payer: Self-pay | Admitting: Pharmacist

## 2022-01-31 ENCOUNTER — Telehealth: Payer: Self-pay

## 2022-01-31 NOTE — Patient Instructions (Signed)
Visit Information  Thank you for taking time to visit with me today. Please don't hesitate to contact me if I can be of assistance to you.   Following are the goals we discussed today:   Goals Addressed             This Visit's Progress    Keep blood pressure controlled       Care Coordination Interventions: Evaluation of current treatment plan related to hypertension self management and patient's adherence to plan as established by provider Discussed plans with patient for ongoing care management follow up and provided patient with direct contact information for care management team Advised patient, providing education and rationale, to monitor blood pressure daily and record, calling PCP for findings outside established parameters Reviewed scheduled/upcoming provider appointments including:  Appts: 02/01/22 with eye and foot MD's           Our next appointment is by telephone on /3 at 1000  Please call the care guide team at [phone] if you need to cancel or reschedule your appointment.   If you are experiencing a Mental Health or [place] or need someone to talk to, please call the Suicide and Crisis Lifeline: 988   The patient verbalized understanding of instructions, educational materials, and care plan provided today and agreed to receive a mailed copy of patient instructions, educational materials, and care plan.   Telephone follow up appointment with care management team member scheduled for: March   Malvern Kadlec J Rashaun Curl, RN, MSN [facility] Care Management Coordinator Direct Line [phone]

## 2022-01-31 NOTE — Telephone Encounter (Signed)
Patient contacted for follow/up of tobacco intake reduction / cessation attempt.   Since last contact patient reports cutting down to slightly more than 1/2 ppd which is more than his goal of < 1/2 ppd (single digit number of cigs) but it is progress toward quitting.   Medications currently being used - none  Has some varenicline which he may consider using if he fails to make continued progress toward complete cessation.   Patient denies any significant side effects from tobacco cessation therapy.  Continues to rates IMPORTANCE of quitting tobacco as high.   Most common triggers to use tobacco include; stress   Motivation to quit: breathing/health Provided information on 1 800-QUIT NOW support program.   Total time with patient call and documentation of interaction: 13 minutes. Follow-up phone call planned: None at this time.   I will ask PCP to share tobacco status and plan after PCP visit 1/9

## 2022-01-31 NOTE — Patient Outreach (Signed)
  Care Coordination   Follow Up Visit Note   01/31/2022 Name: Andre Sherman MRN: [id number] DOB: 02-03-1956  Andre Sherman is a 66 y.o. year old male who sees Chambliss, Jeb Levering, MD for primary care. I spoke with  Andre Sherman by phone today.  What matters to the patients health and wellness today?  none    Goals Addressed             This Visit's Progress    Keep blood pressure controlled       Care Coordination Interventions: Evaluation of current treatment plan related to hypertension self management and patient's adherence to plan as established by provider Discussed plans with patient for ongoing care management follow up and provided patient with direct contact information for care management team Advised patient, providing education and rationale, to monitor blood pressure daily and record, calling PCP for findings outside established parameters Reviewed scheduled/upcoming provider appointments including:  Appts: 02/01/22 with eye and foot MD's           SDOH assessments and interventions completed:  Yes     Care Coordination Interventions:  Yes, provided   Follow up plan: Follow up call scheduled for March    Encounter Outcome:  Pt. Visit Completed   Andre Baseman, RN, MSN [facility] Care Management Coordinator Direct Line [phone]

## 2022-01-31 NOTE — Telephone Encounter (Signed)
-----   Message from Leavy Cella, [place] sent at 01/13/2022 11:12 AM EST ----- Regarding: INtake to single digit number - tolerating / taking varenicline

## 2022-02-01 ENCOUNTER — Encounter: Payer: Medicare HMO | Admitting: Podiatry

## 2022-02-01 ENCOUNTER — Other Ambulatory Visit: Payer: Medicare HMO

## 2022-02-01 DIAGNOSIS — H5213 Myopia, bilateral: Secondary | ICD-10-CM | POA: Diagnosis not present

## 2022-02-14 ENCOUNTER — Other Ambulatory Visit: Payer: Medicare HMO

## 2022-02-14 ENCOUNTER — Ambulatory Visit: Payer: Medicare HMO | Admitting: Podiatry

## 2022-02-14 DIAGNOSIS — M2041 Other hammer toe(s) (acquired), right foot: Secondary | ICD-10-CM | POA: Diagnosis not present

## 2022-02-14 DIAGNOSIS — L84 Corns and callosities: Secondary | ICD-10-CM

## 2022-02-14 DIAGNOSIS — M2042 Other hammer toe(s) (acquired), left foot: Secondary | ICD-10-CM

## 2022-02-17 NOTE — Progress Notes (Signed)
Subjective: Chief Complaint  Patient presents with   Diabetic Ulcer    Left foot ulcer 3rd and 4th toe, Numbness, sharp shooting pain, rate of pain 7 out of 10, no drainage   66 y.o. male presents the office today with above concerns.  He states that she is doing about the same.  He has not seen any open lesions or any drainage or swelling or pus.  Callus on the bottom of the left foot causing the majority of discomfort  Objective: AAO x3, NAD DP/PT pulses palpable bilaterally, CRT less than 3 seconds Hyperkeratotic lesions to the distal aspect of the left third and fourth toes.  Upon debridement the lesions are preulcerative but there is no skin breakdown, drainage or pus or any signs of infection.  No edema, erythema.  Hyperkeratotic lesion submetatarsal 2 without any underlying ulceration or signs of infection. Hammertoes, digital contractures noted. No pain with calf compression, swelling, warmth, erythema  Assessment: 66 year old male with preulcerative calluses due to hammertoes  Plan: -All treatment options discussed with the patient including all alternatives, risks, complications.  -Did sharply debride the calluses with any complications or bleeding.  Today there is no skin breakdown or signs of infection based on his monitor closely.  Continue offloading.  Also debrided the callus submetatarsal any complications.  Offloading, moisturizer to this. -He had 2 nails that were elongated which were trimmed as a courtesy to any complications or bleeding. -Daily foot inspection. -Patient encouraged to call the office with any questions, concerns, change in symptoms.   Trula Slade DPM

## 2022-02-26 ENCOUNTER — Other Ambulatory Visit: Payer: Self-pay | Admitting: Family Medicine

## 2022-03-07 ENCOUNTER — Other Ambulatory Visit: Payer: Self-pay
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
## 2022-03-08 ENCOUNTER — Other Ambulatory Visit: Payer: Self-pay

## 2022-03-08 ENCOUNTER — Encounter: Payer: Self-pay | Admitting: Family Medicine

## 2022-03-08 ENCOUNTER — Ambulatory Visit (INDEPENDENT_AMBULATORY_CARE_PROVIDER_SITE_OTHER): Payer: Medicare HMO | Admitting: Family Medicine

## 2022-03-08 VITALS — BP 142/83 | HR 79 | Wt 209.8 lb

## 2022-03-08 DIAGNOSIS — I10 Essential (primary) hypertension: Secondary | ICD-10-CM

## 2022-03-08 DIAGNOSIS — Z122 Encounter for screening for malignant neoplasm of respiratory organs: Secondary | ICD-10-CM

## 2022-03-08 DIAGNOSIS — F3289 Other specified depressive episodes: Secondary | ICD-10-CM | POA: Diagnosis not present

## 2022-03-08 DIAGNOSIS — Z720 Tobacco use: Secondary | ICD-10-CM

## 2022-03-08 DIAGNOSIS — M86079 Acute hematogenous osteomyelitis, unspecified ankle and foot: Secondary | ICD-10-CM | POA: Diagnosis not present

## 2022-03-08 DIAGNOSIS — R634 Abnormal weight loss: Secondary | ICD-10-CM

## 2022-03-08 MED ORDER — FENTANYL 25 MCG/HR TD PT72: 5.0000 | MEDICATED_PATCH | TRANSDERMAL | 0 refills | Status: DC

## 2022-03-08 MED ORDER — FENTANYL 25 MCG/HR TD PT72: 1.0000 | MEDICATED_PATCH | TRANSDERMAL | 0 refills | Status: DC
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
## 2022-03-08 NOTE — Assessment & Plan Note (Addendum)
Stable on SSRI, trazadone and large dose of gabapentin that he uses primarily for chronic leg pain.  

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
## 2022-03-08 NOTE — Assessment & Plan Note (Signed)
Stable today.  Asked him to bring in all his medications next visit

## 2022-03-08 NOTE — Assessment & Plan Note (Signed)
By report has completely resolved.  Is off all antibiotics

## 2022-03-08 NOTE — Assessment & Plan Note (Signed)
Has changed the way he eats with sweets etc.   Weight loss is stable and likely the result of diet changes

## 2022-03-15 ENCOUNTER — Other Ambulatory Visit: Payer: Self-pay

## 2022-03-15 MED ORDER — TAMSULOSIN HCL 0.4 MG PO CAPS: 0.8000 mg | ORAL_CAPSULE | Freq: Every day | ORAL | 2 refills | Status: DC

## 2022-03-16 ENCOUNTER — Telehealth: Payer: Self-pay
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
## 2022-03-19 ENCOUNTER — Ambulatory Visit (HOSPITAL_BASED_OUTPATIENT_CLINIC_OR_DEPARTMENT_OTHER)
Admission: RE | Admit: 2022-03-19 | Discharge: 2022-03-19 | Disposition: A | Discharge status: Home / Self Care | Payer: Medicare HMO | Source: Ambulatory Visit | Attending: Family Medicine | Admitting: Family Medicine

## 2022-03-19 DIAGNOSIS — Z122 Encounter for screening for malignant neoplasm of respiratory organs: Secondary | ICD-10-CM | POA: Diagnosis not present

## 2022-03-19 DIAGNOSIS — Z87891 Personal history of nicotine dependence: Secondary | ICD-10-CM | POA: Diagnosis not present

## 2022-03-21 ENCOUNTER — Other Ambulatory Visit: Payer: Self-pay | Admitting: *Deleted

## 2022-03-21 MED ORDER — TRAZODONE HCL 50 MG PO TABS: 25.0000 mg | ORAL_TABLET | Freq: Every evening | ORAL | 0 refills | Status: DC | PRN

## 2022-03-21 MED ORDER — SERTRALINE HCL 50 MG PO TABS: 50.0000 mg | ORAL_TABLET | Freq: Every day | ORAL | 1 refills | Status: DC

## 2022-03-25 ENCOUNTER — Ambulatory Visit (INDEPENDENT_AMBULATORY_CARE_PROVIDER_SITE_OTHER): Payer: Medicare HMO | Admitting: Student

## 2022-03-25 VITALS — BP 138/85 | HR 97 | Ht 70.0 in | Wt 212.0 lb

## 2022-03-25 DIAGNOSIS — S0990XA Unspecified injury of head, initial encounter: Secondary | ICD-10-CM | POA: Diagnosis not present
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
## 2022-03-25 NOTE — Patient Instructions (Signed)
It was great seeing you today.  Avoid taking the amount of tylenol and Excedrin you have been- this can damage your liver and stomach lining.  Put on a fentanyl patch when you get home.  I ordered a CT scan of your head- you will be called to schedule this.  We collected testing today- I will call you if it is abnormal. If your results are normal, I will send you a MyChart message.   If you have any questions or concerns, please feel free to call the clinic.   Have a wonderful day,  Dr. Orvis Brill [facility] [phone]

## 2022-03-26 LAB — COMPREHENSIVE METABOLIC PANEL
ALT: 17 IU/L (ref 0–44)
AST: 20 IU/L (ref 0–40)
Albumin/Globulin Ratio: 1.6 (ref 1.2–2.2)
Albumin: 4.3 g/dL (ref 3.9–4.9)
Alkaline Phosphatase: 56 IU/L (ref 44–121)
BUN/Creatinine Ratio: 22 (ref 10–24)
BUN: 21 mg/dL (ref 8–27)
Bilirubin Total: 0.3 mg/dL (ref 0.0–1.2)
CO2: 23 mmol/L (ref 20–29)
Calcium: 9.9 mg/dL (ref 8.6–10.2)
Chloride: 98 mmol/L (ref 96–106)
Creatinine, Ser: 0.96 mg/dL (ref 0.76–1.27)
Globulin, Total: 2.7 g/dL (ref 1.5–4.5)
Glucose: 149 mg/dL — ABNORMAL HIGH (ref 70–99)
Potassium: 5.3 mmol/L — ABNORMAL HIGH (ref 3.5–5.2)
Sodium: 135 mmol/L (ref 134–144)
Total Protein: 7 g/dL (ref 6.0–8.5)
eGFR: 87 mL/min/{1.73_m2} (ref >59)

## 2022-03-26 LAB — CBC
Hematocrit: 38.3 % (ref 37.5–51.0)
Hemoglobin: 13.5 g/dL (ref 13.0–17.7)
MCH: 32.5 pg (ref 26.6–33.0)
MCHC: 35.2 g/dL (ref 31.5–35.7)
MCV: 92 fL (ref 79–97)
Platelets: 270 10*3/uL (ref 150–450)
RBC: 4.15 x10E6/uL (ref 4.14–5.80)
RDW: 11.6 % (ref 11.6–15.4)
WBC: 7.9 10*3/uL (ref 3.4–10.8)

## 2022-03-29 DIAGNOSIS — S0990XA Unspecified injury of head, initial encounter: Secondary | ICD-10-CM | POA: Insufficient documentation
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
## 2022-04-01 ENCOUNTER — Telehealth: Payer: Self-pay

## 2022-04-01 NOTE — Telephone Encounter (Signed)
Patient calls nurse line regarding continued headaches, nausea and generalized abdominal pain. States that headaches are worse in the AM and are more to the back of his head and radiates into his neck.   States that these symptoms have been persistent since head injury. Denies worsening of symptoms, however, is not improving. Denies dizziness or visual changes. He is scheduled for head CT on 2/12.  He states he has been taking "a lot of tylenol" for pain. Asked patient how much tylenol he was taking, he was unable to give me mg amount or how frequent he is taking.  Patient is requesting to speak with PCP regarding next steps. Please return call to patient at [phone]. Will forward to PCP for further advisement.   Talbot Grumbling, RN

## 2022-04-04 MED ORDER — FENTANYL 25 MCG/HR TD PT72: 1.0000 | MEDICATED_PATCH | TRANSDERMAL | 0 refills | Status: DC

## 2022-04-04 NOTE — Telephone Encounter (Signed)
He is feeling better  Has CT scheduled  Will let me know if any worsening  Sees me in March  Reports error with fentanyl RX will represcribe

## 2022-04-11 ENCOUNTER — Ambulatory Visit (HOSPITAL_COMMUNITY): Payer: Medicare HMO

## 2022-04-15 DIAGNOSIS — H40013 Open angle with borderline findings, low risk, bilateral: Secondary | ICD-10-CM | POA: Diagnosis not present

## 2022-04-15 DIAGNOSIS — I10 Essential (primary) hypertension: Secondary | ICD-10-CM | POA: Diagnosis not present

## 2022-04-15 DIAGNOSIS — H2513 Age-related nuclear cataract, bilateral: Secondary | ICD-10-CM | POA: Diagnosis not present

## 2022-04-15 DIAGNOSIS — H2512 Age-related nuclear cataract, left eye: Secondary | ICD-10-CM | POA: Diagnosis not present

## 2022-05-02 ENCOUNTER — Ambulatory Visit: Payer: Self-pay

## 2022-05-02 NOTE — Patient Outreach (Signed)
  Care Coordination   Follow Up Visit Note   05/02/2022 Name: Andre Sherman MRN: [id number] DOB: 09/16/1955  Andre Sherman is a 67 y.o. year old male who sees Chambliss, Jeb Levering, MD for primary care. I spoke with  [name] by phone today.  What matters to the patients health and wellness today?  Blood pressure    Goals Addressed             This Visit's Progress    Keep blood pressure controlled       Patient Goals/Self Care Activities: -Patient/Caregiver will self-administer medications as prescribed as evidenced by self-report/primary caregiver report  -Patient/Caregiver will attend all scheduled provider appointments as evidenced by clinician review of documented attendance to scheduled appointments and patient/caregiver report -Patient/Caregiver will call provider office for new concerns or questions as evidenced by review of documented incoming telephone call notes and patient report  -Calls provider office for new concerns, questions, or BP outside discussed parameters -Checks BP and records as discussed -Follows a low sodium diet/DASH diet   Interventions Today    Flowsheet Row Most Recent Value  Chronic Disease   Chronic disease during today's visit Hypertension (HTN)  General Interventions   General Interventions Discussed/Reviewed General Interventions Discussed, Doctor Visits  Doctor Visits Discussed/Reviewed Doctor Visits Discussed  Education Interventions   Education Provided Provided Education  Provided Verbal Education On Nutrition  Nutrition Interventions   Nutrition Discussed/Reviewed Nutrition Discussed, Decreasing salt              SDOH assessments and interventions completed:  Yes     Care Coordination Interventions:  Yes, provided   Follow up plan: Follow up call scheduled for June    Encounter Outcome:  Pt. Visit Completed   Jone Baseman, RN, MSN [facility] [facility] Care Management Coordinator Direct Line  [phone]

## 2022-05-02 NOTE — Patient Instructions (Signed)
Visit Information  Thank you for taking time to visit with me today. Please don't hesitate to contact me if I can be of assistance to you.   Following are the goals we discussed today:   Goals Addressed             This Visit's Progress    Keep blood pressure controlled       Patient Goals/Self Care Activities: -Patient/Caregiver will self-administer medications as prescribed as evidenced by self-report/primary caregiver report  -Patient/Caregiver will attend all scheduled provider appointments as evidenced by clinician review of documented attendance to scheduled appointments and patient/caregiver report -Patient/Caregiver will call provider office for new concerns or questions as evidenced by review of documented incoming telephone call notes and patient report  -Calls provider office for new concerns, questions, or BP outside discussed parameters -Checks BP and records as discussed -Follows a low sodium diet/DASH diet   Interventions Today    Flowsheet Row Most Recent Value  Chronic Disease   Chronic disease during today's visit Hypertension (HTN)  General Interventions   General Interventions Discussed/Reviewed General Interventions Discussed, Doctor Visits  Doctor Visits Discussed/Reviewed Doctor Visits Discussed  Education Interventions   Education Provided Provided Education  Provided Verbal Education On Nutrition  Nutrition Interventions   Nutrition Discussed/Reviewed Nutrition Discussed, Decreasing salt              Our next appointment is by telephone on 08/01/22 at 1000  Please call the care guide team at [phone] if you need to cancel or reschedule your appointment.   If you are experiencing a Mental Health or [place] or need someone to talk to, please call the Suicide and Crisis Lifeline: 988   The patient verbalized understanding of instructions, educational materials, and care plan provided today and DECLINED offer to receive copy of  patient instructions, educational materials, and care plan.   The patient has been provided with contact information for the care management team and has been advised to call with any health related questions or concerns.   Jone Baseman, RN, MSN [place] [facility] Care Management Coordinator Direct Line [phone]

## 2022-05-09 NOTE — Patient Instructions (Signed)
Good to see you today - Thank you for coming in  Things we discussed today:  Your goal blood pressure is less than 140/90.  Check your blood pressure several times a week.  If regularly higher than this please let me know - either with MyChart or leaving a phone message. Next visit please bring in your blood pressure cuff.     Consider  Smoking - make an appointment to see Dr Raymondo Band  Mood -  seeing a counselor  To screen for colon cancer I have ordered a Cologuard test to be sent to your home.  Please use it and send it in.  If you have questions you can call [phone].      Please always bring your medication bottles  Come back to see me in 2 months
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
## 2022-05-10 ENCOUNTER — Ambulatory Visit (INDEPENDENT_AMBULATORY_CARE_PROVIDER_SITE_OTHER): Payer: Medicare HMO | Admitting: Family Medicine

## 2022-05-10 ENCOUNTER — Encounter: Payer: Self-pay | Admitting: Family Medicine

## 2022-05-10 VITALS — BP 128/74 | HR 70 | Ht 70.0 in | Wt 207.4 lb

## 2022-05-10 DIAGNOSIS — Z1211 Encounter for screening for malignant neoplasm of colon: Secondary | ICD-10-CM | POA: Diagnosis not present

## 2022-05-10 DIAGNOSIS — S86902S Unspecified injury of unspecified muscle(s) and tendon(s) at lower leg level, left leg, sequela: Secondary | ICD-10-CM | POA: Diagnosis not present

## 2022-05-10 DIAGNOSIS — R634 Abnormal weight loss: Secondary | ICD-10-CM | POA: Diagnosis not present

## 2022-05-10 DIAGNOSIS — I10 Essential (primary) hypertension: Secondary | ICD-10-CM

## 2022-05-10 DIAGNOSIS — Z720 Tobacco use: Secondary | ICD-10-CM | POA: Diagnosis not present

## 2022-05-10 DIAGNOSIS — S0990XD Unspecified injury of head, subsequent encounter: Secondary | ICD-10-CM

## 2022-05-10 MED ORDER — FENTANYL 25 MCG/HR TD PT72: 1.0000 | MEDICATED_PATCH | TRANSDERMAL | 0 refills | Status: DC

## 2022-05-10 NOTE — Assessment & Plan Note (Signed)
Symptoms have resolved.  Low risk for any intracranial sequale.

## 2022-05-10 NOTE — Assessment & Plan Note (Signed)
Stable.  Fentanyl patches allow him to remain active.  Using appropriately

## 2022-05-10 NOTE — Assessment & Plan Note (Signed)
Stable. Continue current medications and home monitoring

## 2022-05-10 NOTE — Assessment & Plan Note (Signed)
Unchanged.  Seems more engaged.  Suggested he follow up with Dr Valentina Lucks

## 2022-05-10 NOTE — Assessment & Plan Note (Signed)
Stable with normal recent lab work

## 2022-05-25 DIAGNOSIS — Z1211 Encounter for screening for malignant neoplasm of colon: Secondary | ICD-10-CM | POA: Diagnosis not present

## 2022-05-27 ENCOUNTER — Other Ambulatory Visit: Payer: Self-pay | Admitting: Family Medicine

## 2022-05-31 LAB — COLOGUARD: COLOGUARD: POSITIVE — AB

## 2022-06-03 ENCOUNTER — Telehealth: Payer: Self-pay | Admitting: Family Medicine

## 2022-06-03 DIAGNOSIS — R195 Other fecal abnormalities: Secondary | ICD-10-CM | POA: Insufficient documentation

## 2022-06-03 NOTE — Telephone Encounter (Signed)
Informed him of positve cologard and need for colonosclpy

## 2022-06-29 ENCOUNTER — Other Ambulatory Visit: Payer: Self-pay

## 2022-06-29 ENCOUNTER — Encounter: Payer: Self-pay | Admitting: Family Medicine

## 2022-06-29 ENCOUNTER — Ambulatory Visit (INDEPENDENT_AMBULATORY_CARE_PROVIDER_SITE_OTHER): Payer: Medicare HMO | Admitting: Family Medicine

## 2022-06-29 VITALS — BP 140/85 | HR 81 | Ht 70.0 in | Wt 206.6 lb

## 2022-06-29 DIAGNOSIS — R195 Other fecal abnormalities: Secondary | ICD-10-CM

## 2022-06-29 DIAGNOSIS — F3289 Other specified depressive episodes: Secondary | ICD-10-CM | POA: Diagnosis not present

## 2022-06-29 DIAGNOSIS — I10 Essential (primary) hypertension: Secondary | ICD-10-CM | POA: Diagnosis not present

## 2022-06-29 DIAGNOSIS — S86902S Unspecified injury of unspecified muscle(s) and tendon(s) at lower leg level, left leg, sequela: Secondary | ICD-10-CM | POA: Diagnosis not present

## 2022-06-29 DIAGNOSIS — H5213 Myopia, bilateral: Secondary | ICD-10-CM | POA: Diagnosis not present

## 2022-06-29 MED ORDER — FENTANYL 25 MCG/HR TD PT72: 1.0000 | MEDICATED_PATCH | TRANSDERMAL | 0 refills | Status: DC

## 2022-06-29 NOTE — Assessment & Plan Note (Signed)
Now with change in bowels.  This does not sound like infection.  More consistent with anxiety or laxative over use.  Gave him the number to call for a colonoscopy.

## 2022-06-29 NOTE — Patient Instructions (Addendum)
Good to see you today - Thank you for coming in  Things we discussed today:  Call [facility]  [address], [place], [place] [address]  3.8 mi [phone] For your colonoscopy   Make an appointment with your podiatrist   Let me know if the abdomen pain is getting worse or if you have bleeding  Definitely consider seeing a therapist   Please always bring your medication bottles  Come back to see me in 2 months

## 2022-06-29 NOTE — Assessment & Plan Note (Signed)
Not well controlled although may be more situational which should improve.

## 2022-06-29 NOTE — Assessment & Plan Note (Signed)
BP Readings from Last 3 Encounters:  06/29/22 (!) 140/85  05/10/22 128/74  03/25/22 138/85   Has been labile in past so will just monitor on his current regimen

## 2022-06-29 NOTE — Assessment & Plan Note (Signed)
Continues to use Fentanyl appropriately. It allows him to keep active.   PDMP reviewed.

## 2022-06-29 NOTE — Progress Notes (Signed)
    SUBJECTIVE:   CHIEF COMPLAINT / HPI:   Chronic Pain L Leg About the same.  Trying to stay active.  Using patches every 3-4 days  Depression Feels down mostly due to his sons recent arrest - he will likely be incarerated for long time.  Has battled with mental illness for years.  He is not seeing a therapist but considering Taking sertraline and trazadone daily.  No suicidal ideation   Bowel Changes Having episodes of constipation and diarrhea.  No bleeding or significant abdomen pain.  Has not made an appointment for colonoscopy.  He wonders if symptoms could be stress  OBJECTIVE:   BP (!) 140/85   Pulse 81   Ht 5\' 10"  (1.778 m)   Wt 206 lb 9.6 oz (93.7 kg)   SpO2 95%   BMI 29.64 kg/m   Heart - Regular rate and rhythm.  No murmurs, gallops or rubs.    Lung - mild diffuse wheeze at bases Abdomen: soft and non-tender without masses, organomegaly or hernias noted.  No guarding or rebound   ASSESSMENT/PLAN:   Essential hypertension Assessment & Plan: BP Readings from Last 3 Encounters:  06/29/22 (!) 140/85  05/10/22 128/74  03/25/22 138/85   Has been labile in past so will just monitor on his current regimen  Orders: -     CMP14+EGFR  Sequelae of injury of muscle and tendon of left lower extremity -     fentaNYL; Place 1 patch onto the skin every 3 (three) days. Fill 6 weeks from write date  Dispense: 5 patch; Refill: 0 -     fentaNYL; Place 1 patch onto the skin every 3 (three) days. Fill 4 weeks after write date  Dispense: 5 patch; Refill: 0 -     fentaNYL; Place 1 patch onto the skin every 3 (three) days.  Dispense: 5 patch; Refill: 0  Positive colorectal cancer screening using Cologuard test Assessment & Plan: Now with change in bowels.  This does not sound like infection.  More consistent with anxiety or laxative over use.  Gave him the number to call for a colonoscopy.        Patient Instructions  Good to see you today - Thank you for coming  in  Things we discussed today:  Call [facility]  [address], [place], [place] [address]  3.8 mi [phone] For your colonoscopy   Make an appointment with your podiatrist   Let me know if the abdomen pain is getting worse or if you have bleeding  Definitely consider seeing a therapist   Please always bring your medication bottles  Come back to see me in 2 months    Carney Living, MD [facility]

## 2022-06-30 ENCOUNTER — Encounter: Payer: Self-pay | Admitting: Family Medicine

## 2022-06-30 ENCOUNTER — Encounter: Payer: Self-pay | Admitting: Internal Medicine

## 2022-06-30 LAB — CMP14+EGFR
ALT: 22 IU/L (ref 0–44)
AST: 22 IU/L (ref 0–40)
Albumin/Globulin Ratio: 1.6 (ref 1.2–2.2)
Albumin: 4.4 g/dL (ref 3.9–4.9)
Alkaline Phosphatase: 60 IU/L (ref 44–121)
BUN/Creatinine Ratio: 20 (ref 10–24)
BUN: 18 mg/dL (ref 8–27)
Bilirubin Total: 0.2 mg/dL (ref 0.0–1.2)
CO2: 24 mmol/L (ref 20–29)
Calcium: 10 mg/dL (ref 8.6–10.2)
Chloride: 97 mmol/L (ref 96–106)
Creatinine, Ser: 0.9 mg/dL (ref 0.76–1.27)
Globulin, Total: 2.7 g/dL (ref 1.5–4.5)
Glucose: 108 mg/dL — ABNORMAL HIGH (ref 70–99)
Potassium: 5.3 mmol/L — ABNORMAL HIGH (ref 3.5–5.2)
Sodium: 137 mmol/L (ref 134–144)
Total Protein: 7.1 g/dL (ref 6.0–8.5)
eGFR: 94 mL/min/{1.73_m2} (ref >59)

## 2022-07-07 ENCOUNTER — Telehealth: Payer: Self-pay

## 2022-07-07 NOTE — Telephone Encounter (Signed)
Patient calls nurse line requesting tests results.   Results discussed with patient and advised a detailed letter was mailed to his him.   Patient reports he is still feeling "not great and not myself." He reports he forgot to mention to PCP that he pulled a tick off himself ~ 1 month. He denies any rashes, redness, swelling or fevers.   He was wondering if any labs ran on 5/1 would detect possible concerns for Lyme Disease.   Will forward to PCP.

## 2022-07-13 ENCOUNTER — Ambulatory Visit (AMBULATORY_SURGERY_CENTER): Payer: Medicare HMO | Admitting: *Deleted

## 2022-07-13 VITALS — Ht 70.0 in | Wt 205.0 lb

## 2022-07-13 DIAGNOSIS — R195 Other fecal abnormalities: Secondary | ICD-10-CM

## 2022-07-13 DIAGNOSIS — Z1211 Encounter for screening for malignant neoplasm of colon: Secondary | ICD-10-CM

## 2022-07-13 MED ORDER — NA SULFATE-K SULFATE-MG SULF 17.5-3.13-1.6 GM/177ML PO SOLN: 1.0000 | Freq: Once | ORAL | 0 refills | Status: AC

## 2022-07-13 NOTE — Progress Notes (Signed)
Pt's name and DOB verified at the beginning of the pre-visit.  Pt denies any difficulty with ambulating,sitting, laying down or rolling side to side Gave both [facility] main # and MD on call # prior to instructions.  No egg or soy allergy known to patient  No issues known to pt with past sedation with any surgeries or procedures Pt denies having issues being intubated Patient denies ever being intubatedPt has no issues moving head neck or swallowing No FH of Malignant Hyperthermia Pt is not on diet pills Pt is not on home 02  Pt is not on blood thinners  Pt has frequent issues with constipation RN instructed pt to use Miralax per bottles instructions a week before prep days. Pt states they will Pt is not on dialysis Pt denise any abnormal heart rhythms  Pt denies any upcoming cardiac testing Pt encouraged to use to use Singlecare or Goodrx to reduce cost  Patient's chart reviewed by Cathlyn Parsons CNRA prior to pre-visit and patient appropriate for the [facility].  Pre-visit completed and red dot placed by patient's name on their procedure day (on provider's schedule).  . Visit by phone Pt states weight is 205 lb Instructed pt why it is important to and  to call if they have any changes in health or new medications. Directed them to the # given and on instructions.   Pt states they will.  Instructions reviewed with pt and pt states understanding. Instructed to review again prior to procedure. Pt states they will.  Instructions sent by mail with coupon and by my chart

## 2022-07-15 ENCOUNTER — Encounter (HOSPITAL_COMMUNITY): Payer: Self-pay

## 2022-07-15 ENCOUNTER — Emergency Department (HOSPITAL_COMMUNITY): Payer: Medicare HMO

## 2022-07-15 ENCOUNTER — Other Ambulatory Visit: Payer: Self-pay

## 2022-07-15 ENCOUNTER — Emergency Department (HOSPITAL_COMMUNITY)
Admission: EM | Admit: 2022-07-15 | Discharge: 2022-07-15 | Disposition: A | Discharge status: Home / Self Care | Payer: Medicare HMO | Attending: Emergency Medicine | Admitting: Emergency Medicine

## 2022-07-15 ENCOUNTER — Ambulatory Visit (HOSPITAL_COMMUNITY)
Admission: EM | Admit: 2022-07-15 | Discharge: 2022-07-15 | Disposition: A | Discharge status: Home / Self Care | Payer: Medicare HMO

## 2022-07-15 DIAGNOSIS — S2241XA Multiple fractures of ribs, right side, initial encounter for closed fracture: Secondary | ICD-10-CM | POA: Diagnosis not present

## 2022-07-15 DIAGNOSIS — S27321A Contusion of lung, unilateral, initial encounter: Secondary | ICD-10-CM

## 2022-07-15 DIAGNOSIS — S2231XA Fracture of one rib, right side, initial encounter for closed fracture: Secondary | ICD-10-CM | POA: Diagnosis not present

## 2022-07-15 DIAGNOSIS — R519 Headache, unspecified: Secondary | ICD-10-CM | POA: Diagnosis not present

## 2022-07-15 DIAGNOSIS — M47812 Spondylosis without myelopathy or radiculopathy, cervical region: Secondary | ICD-10-CM | POA: Diagnosis not present

## 2022-07-15 DIAGNOSIS — E871 Hypo-osmolality and hyponatremia: Secondary | ICD-10-CM | POA: Insufficient documentation

## 2022-07-15 DIAGNOSIS — M542 Cervicalgia: Secondary | ICD-10-CM | POA: Insufficient documentation

## 2022-07-15 DIAGNOSIS — S20211A Contusion of right front wall of thorax, initial encounter: Secondary | ICD-10-CM | POA: Diagnosis not present

## 2022-07-15 DIAGNOSIS — I6381 Other cerebral infarction due to occlusion or stenosis of small artery: Secondary | ICD-10-CM | POA: Diagnosis not present

## 2022-07-15 DIAGNOSIS — Z89422 Acquired absence of other left toe(s): Secondary | ICD-10-CM | POA: Diagnosis not present

## 2022-07-15 DIAGNOSIS — S80211A Abrasion, right knee, initial encounter: Secondary | ICD-10-CM | POA: Insufficient documentation

## 2022-07-15 DIAGNOSIS — S50311A Abrasion of right elbow, initial encounter: Secondary | ICD-10-CM | POA: Insufficient documentation

## 2022-07-15 DIAGNOSIS — R0789 Other chest pain: Secondary | ICD-10-CM | POA: Diagnosis present

## 2022-07-15 LAB — CBC WITH DIFFERENTIAL/PLATELET
Abs Immature Granulocytes: 0.03 10*3/uL (ref 0.00–0.07)
Basophils Absolute: 0.1 10*3/uL (ref 0.0–0.1)
Basophils Relative: 1 %
Eosinophils Absolute: 0.6 10*3/uL — ABNORMAL HIGH (ref 0.0–0.5)
Eosinophils Relative: 6 %
HCT: 39.6 % (ref 39.0–52.0)
Hemoglobin: 13.7 g/dL (ref 13.0–17.0)
Immature Granulocytes: 0 %
Lymphocytes Relative: 16 %
Lymphs Abs: 1.6 10*3/uL (ref 0.7–4.0)
MCH: 32.9 pg (ref 26.0–34.0)
MCHC: 34.6 g/dL (ref 30.0–36.0)
MCV: 95.2 fL (ref 80.0–100.0)
Monocytes Absolute: 0.7 10*3/uL (ref 0.1–1.0)
Monocytes Relative: 8 %
Neutro Abs: 6.7 10*3/uL (ref 1.7–7.7)
Neutrophils Relative %: 69 %
Platelets: 222 10*3/uL (ref 150–400)
RBC: 4.16 MIL/uL — ABNORMAL LOW (ref 4.22–5.81)
RDW: 12.6 % (ref 11.5–15.5)
WBC: 9.8 10*3/uL (ref 4.0–10.5)
nRBC: 0 % (ref 0.0–0.2)

## 2022-07-15 LAB — COMPREHENSIVE METABOLIC PANEL
ALT: 19 U/L (ref 0–44)
AST: 20 U/L (ref 15–41)
Albumin: 3.8 g/dL (ref 3.5–5.0)
Alkaline Phosphatase: 47 U/L (ref 38–126)
Anion gap: 11 (ref 5–15)
BUN: 17 mg/dL (ref 8–23)
CO2: 22 mmol/L (ref 22–32)
Calcium: 9.4 mg/dL (ref 8.9–10.3)
Chloride: 97 mmol/L — ABNORMAL LOW (ref 98–111)
Creatinine, Ser: 0.88 mg/dL (ref 0.61–1.24)
GFR, Estimated: >60 mL/min (ref >60)
Glucose, Bld: 103 mg/dL — ABNORMAL HIGH (ref 70–99)
Potassium: 4.2 mmol/L (ref 3.5–5.1)
Sodium: 130 mmol/L — ABNORMAL LOW (ref 135–145)
Total Bilirubin: 0.7 mg/dL (ref 0.3–1.2)
Total Protein: 6.7 g/dL (ref 6.5–8.1)

## 2022-07-15 MED ORDER — HYDROCODONE-ACETAMINOPHEN 5-325 MG PO TABS
2.0000 | ORAL_TABLET | Freq: Once | ORAL | Status: AC
  Administered 2022-07-15: 2 via ORAL
  Filled 2022-07-15: qty 2

## 2022-07-15 MED ORDER — ONDANSETRON 4 MG PO TBDP
8.0000 mg | ORAL_TABLET | Freq: Once | ORAL | Status: AC
  Administered 2022-07-15: 8 mg via ORAL
  Filled 2022-07-15: qty 2

## 2022-07-15 MED ORDER — LIDOCAINE 5 % EX PTCH: 1.0000 | MEDICATED_PATCH | CUTANEOUS | 0 refills | Status: DC

## 2022-07-15 MED ORDER — IBUPROFEN 400 MG PO TABS
600.0000 mg | ORAL_TABLET | Freq: Once | ORAL | Status: AC
  Administered 2022-07-15: 600 mg via ORAL
  Filled 2022-07-15: qty 1

## 2022-07-15 MED ORDER — HYDROMORPHONE HCL 1 MG/ML IJ SOLN
0.5000 mg | Freq: Once | INTRAMUSCULAR | Status: AC
  Administered 2022-07-15: 0.5 mg via INTRAMUSCULAR
  Filled 2022-07-15: qty 1

## 2022-07-15 MED ORDER — HYDROCODONE-ACETAMINOPHEN 5-325 MG PO TABS
1.0000 | ORAL_TABLET | Freq: Once | ORAL | Status: AC
  Administered 2022-07-15: 1 via ORAL
  Filled 2022-07-15: qty 1

## 2022-07-15 MED ORDER — OXYCODONE HCL 5 MG PO TABS: 5.0000 mg | ORAL_TABLET | Freq: Four times a day (QID) | ORAL | 0 refills | Status: DC | PRN

## 2022-07-15 NOTE — ED Notes (Addendum)
Patient is being discharged from the Urgent Care and sent to the Emergency Department via POV . Per Dr. Haynes Bast, patient is in need of higher level of care due to head injury with positive LOC.. Patient is aware and verbalizes understanding of plan of care.  Vitals:   07/15/22 0936  BP: (!) 164/86  Pulse: 81  Resp: 20  Temp: 98.1 F (36.7 C)  SpO2: 96%
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
## 2022-07-15 NOTE — Discharge Instructions (Addendum)
Your workup today shows you have 3 rib fractures on the right as well as a bruise on your lung.  He did not require any oxygen.  The rest of your workup was overall reassuring.  Your sodium level was slightly low at 130.  Please ensure you follow-up with your primary care doctor on Monday to have this repeated to ensure this does not drop significantly in return to normal.  He had fentanyl prescribed at home.  Continue taking that.  In addition to this take Tylenol 1000 mg every 6 hours.  Also take 600-800 mg of ibuprofen every 6-8 hours.  Have also sent in a lidocaine patch you can use.  If you have any concerning symptoms return to the emergency department.   I did send in some pain medication for you.  Given that you are supplied with pain medication by your primary care doctor this may jeopardize your pain contract if you get this medication filled.  For this reason I do recommend that you discuss this with them prior to getting this filled.

## 2022-07-15 NOTE — ED Notes (Signed)
Pt ambulated to bathroom without assistance. Steady gait.
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
## 2022-07-19 ENCOUNTER — Telehealth: Payer: Self-pay

## 2022-07-19 NOTE — Telephone Encounter (Addendum)
Transition Care Management Follow-up Telephone Call Date of discharge and from where: 07/15/2022 [facility] How have you been since you were released from the hospital? Patient is not feeling better and is still sore. Any questions or concerns? No  Items Reviewed: Did the pt receive and understand the discharge instructions provided? Yes  Medications obtained and verified? Yes  Other? No  Any new allergies since your discharge? No  Dietary orders reviewed? Yes Do you have support at home? Yes   Follow up appointments reviewed:  PCP Hospital f/u appt confirmed? Yes  Scheduled to see Carmin Muskrat MD on 08/26/2022 @ [facility] f/u appt confirmed? No  Scheduled to see  on  @ . Are transportation arrangements needed? No  If their condition worsens, is the pt aware to call PCP or go to the Emergency Dept.? Yes Was the patient provided with contact information for the PCP's office or ED? Yes Was to pt encouraged to call back with questions or concerns? Yes  Mellisa Arshad [facility]  [facility] Population Health Community Resource Care Guide   [email or web address]  ?? [phone]   Website: [email or web address]  [email or web address]

## 2022-07-20 ENCOUNTER — Telehealth: Payer: Self-pay
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
## 2022-07-29 ENCOUNTER — Encounter: Payer: Self-pay | Admitting: Internal Medicine

## 2022-07-31 ENCOUNTER — Encounter: Payer: Self-pay | Admitting: Certified Registered Nurse Anesthetist

## 2022-08-01 ENCOUNTER — Other Ambulatory Visit: Payer: Self-pay | Admitting: *Deleted

## 2022-08-01 ENCOUNTER — Ambulatory Visit: Payer: Self-pay

## 2022-08-01 MED ORDER — TRAZODONE HCL 50 MG PO TABS: 25.0000 mg | ORAL_TABLET | Freq: Every evening | ORAL | 0 refills | Status: DC | PRN
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
## 2022-08-08 ENCOUNTER — Ambulatory Visit (AMBULATORY_SURGERY_CENTER): Payer: Medicare HMO | Admitting: Internal Medicine

## 2022-08-08 ENCOUNTER — Encounter: Payer: Self-pay | Admitting: Internal Medicine

## 2022-08-08 VITALS — BP 99/79 | HR 69 | Temp 98.0°F | Resp 12 | Ht 70.0 in | Wt 205.0 lb

## 2022-08-08 DIAGNOSIS — R7303 Prediabetes: Secondary | ICD-10-CM | POA: Diagnosis not present

## 2022-08-08 DIAGNOSIS — Z1211 Encounter for screening for malignant neoplasm of colon: Secondary | ICD-10-CM | POA: Diagnosis not present

## 2022-08-08 DIAGNOSIS — K5520 Angiodysplasia of colon without hemorrhage: Secondary | ICD-10-CM

## 2022-08-08 DIAGNOSIS — R194 Change in bowel habit: Secondary | ICD-10-CM

## 2022-08-08 DIAGNOSIS — R195 Other fecal abnormalities: Secondary | ICD-10-CM | POA: Diagnosis not present

## 2022-08-08 DIAGNOSIS — I10 Essential (primary) hypertension: Secondary | ICD-10-CM | POA: Diagnosis not present

## 2022-08-08 HISTORY — DX: Angiodysplasia of colon without hemorrhage: K55.20

## 2022-08-08 MED ORDER — SODIUM CHLORIDE 0.9 % IV SOLN
500.0000 mL | INTRAVENOUS | Status: DC
Start: 2022-08-08 — End: 2022-08-08

## 2022-08-08 NOTE — Progress Notes (Signed)
Patient reports no changes in health or medications since pre visit. 
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
## 2022-08-08 NOTE — Patient Instructions (Addendum)
You have an angiodysplasia of cecum, also called an AVM.  This is a collection of blood vessels on the surface of the colon lining and it can leak blood. I believe this is why the Cologuard was abnormal.  I appreciate the opportunity to care for you. Iva Boop, MD, FACG   YOU HAD AN ENDOSCOPIC PROCEDURE TODAY AT THE [facility]:   Refer to the procedure report that was given to you for any specific questions about what was found during the examination.  If the procedure report does not answer your questions, please call your gastroenterologist to clarify.  If you requested that your care partner not be given the details of your procedure findings, then the procedure report has been included in a sealed envelope for you to review at your convenience later.  YOU SHOULD EXPECT: Some feelings of bloating in the abdomen. Passage of more gas than usual.  Walking can help get rid of the air that was put into your GI tract during the procedure and reduce the bloating. If you had a lower endoscopy (such as a colonoscopy or flexible sigmoidoscopy) you may notice spotting of blood in your stool or on the toilet paper. If you underwent a bowel prep for your procedure, you may not have a normal bowel movement for a few days.  Please Note:  You might notice some irritation and congestion in your nose or some drainage.  This is from the oxygen used during your procedure.  There is no need for concern and it should clear up in a day or so.  SYMPTOMS TO REPORT IMMEDIATELY:  Following lower endoscopy (colonoscopy or flexible sigmoidoscopy):  Excessive amounts of blood in the stool  Significant tenderness or worsening of abdominal pains  Swelling of the abdomen that is new, acute  Fever of 100F or higher  For urgent or emergent issues, a gastroenterologist can be reached at any hour by calling [phone]. Do not use MyChart messaging for urgent concerns.    DIET:  We do recommend a  small meal at first, but then you may proceed to your regular diet.  Drink plenty of fluids but you should avoid alcoholic beverages for 24 hours.  ACTIVITY:  You should plan to take it easy for the rest of today and you should NOT DRIVE or use heavy machinery until tomorrow (because of the sedation medicines used during the test).    FOLLOW UP: Our staff will call the number listed on your records the next business day following your procedure.  We will call around 7:15- 8:00 am to check on you and address any questions or concerns that you may have regarding the information given to you following your procedure. If we do not reach you, we will leave a message.     If any biopsies were taken you will be contacted by phone or by letter within the next 1-3 weeks.  Please call us at [phone] if you have not heard about the biopsies in 3 weeks.    SIGNATURES/CONFIDENTIALITY: You and/or your care partner have signed paperwork which will be entered into your electronic medical record.  These signatures attest to the fact that that the information above on your After Visit Summary has been reviewed and is understood.  Full responsibility of the confidentiality of this discharge information lies with you and/or your care-partner.

## 2022-08-08 NOTE — Op Note (Signed)
[facility] Patient Name: Andre Sherman Procedure Date: 08/08/2022 10:25 AM MRN: [id number] Endoscopist: Iva Boop , MD, [id number] Age: 67 Referring MD:  Date of Birth: 09-04-1955 Gender: Male Account #: [id number] Procedure:                Colonoscopy Indications:              Positive Cologuard test Medicines:                Monitored Anesthesia Care Procedure:                Pre-Anesthesia Assessment:                           - Prior to the procedure, a History and Physical                            was performed, and patient medications and                            allergies were reviewed. The patient's tolerance of                            previous anesthesia was also reviewed. The risks                            and benefits of the procedure and the sedation                            options and risks were discussed with the patient.                            All questions were answered, and informed consent                            was obtained. Prior Anticoagulants: The patient has                            taken no anticoagulant or antiplatelet agents. ASA                            Grade Assessment: II - A patient with mild systemic                            disease. After reviewing the risks and benefits,                            the patient was deemed in satisfactory condition to                            undergo the procedure.                           After obtaining informed consent, the colonoscope  was passed under direct vision. Throughout the                            procedure, the patient's blood pressure, pulse, and                            oxygen saturations were monitored continuously. The                            CF [id number] #[id number] was introduced through the anus                            and advanced to the the cecum, identified by                            appendiceal orifice and ileocecal  valve. The                            colonoscopy was performed without difficulty. The                            patient tolerated the procedure well. The quality                            of the bowel preparation was good. The ileocecal                            valve, appendiceal orifice, and rectum were                            photographed. The bowel preparation used was SUPREP                            via split dose instruction. Scope In: 10:31:33 AM Scope Out: 10:43:27 AM Scope Withdrawal Time: 0 hours 8 minutes 50 seconds  Total Procedure Duration: 0 hours 11 minutes 54 seconds  Findings:                 The perianal and digital rectal examinations were                            normal. Pertinent negatives include normal prostate                            (size, shape, and consistency).                           A single small angiodysplastic lesion without                            bleeding was found in the cecum.                           The exam was otherwise without abnormality on  direct and retroflexion views. Complications:            No immediate complications. Estimated Blood Loss:     Estimated blood loss: none. Impression:               - A single non-bleeding colonic angiodysplastic                            lesion. In cecum. I think this is cause of abnormal                            Cologuard test.                           - The examination was otherwise normal on direct                            and retroflexion views.                           - No specimens collected. Recommendation:           - Patient has a contact number available for                            emergencies. The signs and symptoms of potential                            delayed complications were discussed with the                            patient. Return to normal activities tomorrow.                            Written discharge instructions were  provided to the                            patient.                           - Resume previous diet.                           - Continue present medications.                           - No repeat colonoscopy or other screening tests                            due to current age (83 years or older) and the                            absence of colonic polyps. Iva Boop, MD 08/08/2022 10:50:36 AM This report has been signed electronically.

## 2022-08-08 NOTE — Progress Notes (Signed)
Report given to PACU, vss 

## 2022-08-09 ENCOUNTER — Telehealth: Payer: Self-pay

## 2022-08-09 NOTE — Telephone Encounter (Signed)
  Follow up Call-     08/08/2022    9:45 AM  Call back number  Post procedure Call Back phone  # [phone]  Permission to leave phone message Yes     Patient questions:  Do you have a fever, pain , or abdominal swelling? No. Pain Score  0 *  Have you tolerated food without any problems? Yes.    Have you been able to return to your normal activities? Yes.    Do you have any questions about your discharge instructions: Diet   No. Medications  No. Follow up visit  No.  Do you have questions or concerns about your Care? No.  Actions: * If pain score is 4 or above: No action needed, pain <4.

## 2022-08-24 ENCOUNTER — Other Ambulatory Visit: Payer: Self-pay | Admitting: Family Medicine

## 2022-08-24 DIAGNOSIS — M1A0790 Idiopathic chronic gout, unspecified ankle and foot, without tophus (tophi): Secondary | ICD-10-CM

## 2022-08-26 ENCOUNTER — Other Ambulatory Visit: Payer: Self-pay | Admitting: Family Medicine

## 2022-08-26 ENCOUNTER — Other Ambulatory Visit: Payer: Self-pay

## 2022-08-26 ENCOUNTER — Ambulatory Visit (INDEPENDENT_AMBULATORY_CARE_PROVIDER_SITE_OTHER): Payer: Medicare HMO | Admitting: Family Medicine

## 2022-08-26 VITALS — BP 135/84 | HR 70 | Ht 70.0 in | Wt 207.0 lb

## 2022-08-26 DIAGNOSIS — R519 Headache, unspecified: Secondary | ICD-10-CM | POA: Diagnosis not present

## 2022-08-26 DIAGNOSIS — J309 Allergic rhinitis, unspecified: Secondary | ICD-10-CM

## 2022-08-26 DIAGNOSIS — R739 Hyperglycemia, unspecified: Secondary | ICD-10-CM

## 2022-08-26 DIAGNOSIS — K5900 Constipation, unspecified: Secondary | ICD-10-CM | POA: Diagnosis not present

## 2022-08-26 DIAGNOSIS — E871 Hypo-osmolality and hyponatremia: Secondary | ICD-10-CM | POA: Diagnosis not present

## 2022-08-26 LAB — POCT GLYCOSYLATED HEMOGLOBIN (HGB A1C): Hemoglobin A1C: 5.7 % — AB (ref 4.0–5.6)

## 2022-08-26 MED ORDER — LORATADINE 10 MG PO TABS
10.0000 mg | ORAL_TABLET | Freq: Every day | ORAL | 11 refills | Status: AC
Start: 2022-08-26 — End: ?

## 2022-08-26 NOTE — Assessment & Plan Note (Signed)
Likely functional with normal by report recent colonoscopy. Recommend daily miralax and monitor

## 2022-08-26 NOTE — Patient Instructions (Addendum)
Good to see you today - Thank you for coming in  Things we discussed today:  Bowels Use Miralax every day   Headaches Cut back as much as possible on tylenol Take the clariten every day Use wax softer twice a day every day   Please always bring your medication bottles  Come back to see me in 2-3 weeks

## 2022-08-26 NOTE — Assessment & Plan Note (Signed)
Unsure of cause.  Could be medication over use but he does not think could be.  Decided to treat for allergies and cerumen impaction.  No signs of focal cns lesion on exam and normal recent head CT.  Close follow up

## 2022-08-26 NOTE — Progress Notes (Signed)
    SUBJECTIVE:   CHIEF COMPLAINT / HPI:   Headache Has daily sometimes all day.  Feels could be allergies.  Both ears are stopped with wax.  Had negative head CT in May.  Taking about 4 gm tylenol and some ibuprofen daily.  No fever or stiff neck  Bowels Had negative (by report) colonoscopy in May.  Having constipation despite using stool softener   OBJECTIVE:   BP 135/84   Pulse 70   Ht 5\' 10"  (1.778 m)   Wt 207 lb (93.9 kg)   SpO2 94%   BMI 29.70 kg/m   Ears - both occluded with wax PERRL Lungs - diffuse wheezing  Mobility:able to get up and down from exam table without assistance or distress   ASSESSMENT/PLAN:   Hyperglycemia -     POCT glycosylated hemoglobin (Hb A1C)  Hyponatremia -     Basic metabolic panel  Allergic rhinitis, unspecified seasonality, unspecified trigger -     Loratadine; Take 1 tablet (10 mg total) by mouth daily.  Dispense: 30 tablet; Refill: 11  Nonintractable headache, unspecified chronicity pattern, unspecified headache type Assessment & Plan: Unsure of cause.  Could be medication over use but he does not think could be.  Decided to treat for allergies and cerumen impaction.  No signs of focal cns lesion on exam and normal recent head CT.  Close follow up    Constipation, unspecified constipation type Assessment & Plan: Likely functional with normal by report recent colonoscopy. Recommend daily miralax and monitor       Patient Instructions  Good to see you today - Thank you for coming in  Things we discussed today:  Bowels Use Miralax every day   Headaches Cut back as much as possible on tylenol Take the clariten every day Use wax softer twice a day every day   Please always bring your medication bottles  Come back to see me in 2-3 weeks    Carney Living, MD [facility]

## 2022-08-27 LAB — BASIC METABOLIC PANEL
BUN/Creatinine Ratio: 17 (ref 10–24)
BUN: 15 mg/dL (ref 8–27)
CO2: 22 mmol/L (ref 20–29)
Calcium: 9.9 mg/dL (ref 8.6–10.2)
Chloride: 97 mmol/L (ref 96–106)
Creatinine, Ser: 0.89 mg/dL (ref 0.76–1.27)
Glucose: 109 mg/dL — ABNORMAL HIGH (ref 70–99)
Potassium: 4.5 mmol/L (ref 3.5–5.2)
Sodium: 135 mmol/L (ref 134–144)
eGFR: 95 mL/min/{1.73_m2} (ref >59)

## 2022-09-07 ENCOUNTER — Telehealth: Payer: Self-pay

## 2022-09-07 DIAGNOSIS — R519 Headache, unspecified: Secondary | ICD-10-CM

## 2022-09-07 NOTE — Telephone Encounter (Signed)
Patient calls nurse line regarding continued issues with headaches. States that he continues to have sinus pressure and headache.States that loratadine is "helping a little."   He is asking if he could try a nasal spray to help with these symptoms.   If appropriate, please send prescription to [facility].   Veronda Prude, RN

## 2022-09-08 MED ORDER — FLUTICASONE PROPIONATE 50 MCG/ACT NA SUSP
2.0000 | Freq: Every day | NASAL | 6 refills | Status: DC
Start: 2022-09-08 — End: 2023-03-03

## 2022-09-08 NOTE — Telephone Encounter (Signed)
Pls let patient know I send it an Rx for flonase nasal spray.  If not better in a week he should come for a visit  Thanks  LC

## 2022-09-09 NOTE — Telephone Encounter (Signed)
Patient has been informed. Penni Bombard CMA

## 2022-09-20 ENCOUNTER — Ambulatory Visit (INDEPENDENT_AMBULATORY_CARE_PROVIDER_SITE_OTHER): Payer: Medicare HMO | Admitting: Family Medicine

## 2022-09-20 ENCOUNTER — Other Ambulatory Visit: Payer: Self-pay

## 2022-09-20 ENCOUNTER — Encounter: Payer: Self-pay | Admitting: Family Medicine

## 2022-09-20 VITALS — BP 104/82 | HR 104 | Ht 70.0 in | Wt 214.2 lb

## 2022-09-20 DIAGNOSIS — I10 Essential (primary) hypertension: Secondary | ICD-10-CM | POA: Diagnosis not present

## 2022-09-20 DIAGNOSIS — L84 Corns and callosities: Secondary | ICD-10-CM

## 2022-09-20 DIAGNOSIS — S86902S Unspecified injury of unspecified muscle(s) and tendon(s) at lower leg level, left leg, sequela: Secondary | ICD-10-CM

## 2022-09-20 DIAGNOSIS — R519 Headache, unspecified: Secondary | ICD-10-CM

## 2022-09-20 MED ORDER — FENTANYL 25 MCG/HR TD PT72: 1.0000 | MEDICATED_PATCH | TRANSDERMAL | 0 refills | Status: DC

## 2022-09-20 MED ORDER — FENTANYL 25 MCG/HR TD PT72
1.0000 | MEDICATED_PATCH | TRANSDERMAL | 0 refills | Status: DC
Start: 2022-09-20 — End: 2022-12-06

## 2022-09-20 NOTE — Assessment & Plan Note (Signed)
This was pared down with 15 blade today in clinic.  No signs of infection.  Recommend follow up with podiatry for regular care and to try OTC donut pad

## 2022-09-20 NOTE — Assessment & Plan Note (Signed)
Improved with treatment for allergic rhinitis.  Continue claritin and flonase

## 2022-09-20 NOTE — Assessment & Plan Note (Signed)
Stable.  Continues with chronic pain well managed with fentanyl 25 mcg patch using appropriately.  Continue

## 2022-09-20 NOTE — Patient Instructions (Signed)
Good to see you today - Thank you for coming in  Things we discussed today:  Ears Keep using softner twice a day especially in R ear  Nose Headache  Keep using clariten and nasal spray  Callus on foot Make an appointment with podiatrist  I refilled your fentanyl for 2 months  Please always bring your medication bottles - Double check your list at home   Come back to see me in 2 months

## 2022-09-20 NOTE — Progress Notes (Signed)
    SUBJECTIVE:   CHIEF COMPLAINT / HPI:   Headache Improved with antihistamine and flonase.  Cerumen Feels is some better but still stopped up.  Using debrox daily in R ear less in L   Bowels Are back to normal  L Foot Area of callus that is painful when he walks on it.  Has been pared down in past by podiatry and also he self removes.  No fever or redness or discharge     OBJECTIVE:   BP 104/82   Pulse (!) 104   Ht 5\' 10"  (1.778 m)   Wt 214 lb 3.2 oz (97.2 kg)   SpO2 93%   BMI 30.73 kg/m   L Leg - has well healed large scars with depressions on upper thigh and lower leg Area of callus on lateral distal foot No erythema or discharge    ASSESSMENT/PLAN:   Sequelae of injury of muscle and tendon of left lower extremity Assessment & Plan: Stable.  Continues with chronic pain well managed with fentanyl 25 mcg patch using appropriately.  Continue  Orders: -     fentaNYL; Place 1 patch onto the skin every 3 (three) days.  Dispense: 5 patch; Refill: 0  Essential hypertension Assessment & Plan: Did not bring in medications but thinks he is taking regularly.  No lightheadness.  Continue current medications    Nonintractable headache, unspecified chronicity pattern, unspecified headache type Assessment & Plan: Improved with treatment for allergic rhinitis.  Continue claritin and flonase    Corn of foot Assessment & Plan: This was pared down with 15 blade today in clinic.  No signs of infection.  Recommend follow up with podiatry for regular care and to try OTC donut pad    Other orders -     fentaNYL; Place 1 patch onto the skin every 3 (three) days.  Dispense: 5 patch; Refill: 0 -     fentaNYL; Place 1 patch onto the skin every 3 (three) days.  Dispense: 5 patch; Refill: 0 -     fentaNYL; Place 1 patch onto the skin every 3 (three) days.  Dispense: 5 patch; Refill: 0     Patient Instructions  Good to see you today - Thank you for coming in  Things we  discussed today:  Ears Keep using softner twice a day especially in R ear  Nose Headache  Keep using clariten and nasal spray  Callus on foot Make an appointment with podiatrist  I refilled your fentanyl for 2 months  Please always bring your medication bottles - Double check your list at home   Come back to see me in 2 months   Carney Living, MD [facility]

## 2022-09-20 NOTE — Assessment & Plan Note (Addendum)
Did not bring in medications but thinks he is taking regularly.  No lightheadness.  Continue current medications

## 2022-10-17 ENCOUNTER — Other Ambulatory Visit: Payer: Self-pay | Admitting: Family Medicine

## 2022-10-17 DIAGNOSIS — S86902S Unspecified injury of unspecified muscle(s) and tendon(s) at lower leg level, left leg, sequela: Secondary | ICD-10-CM

## 2022-10-17 NOTE — Telephone Encounter (Signed)
Patient walked in stating he needs a refill on his patches.   Fentanyl 25 mg  Last OV was 09/20/22 Please call patient [phone]

## 2022-11-01 ENCOUNTER — Ambulatory Visit: Payer: Self-pay

## 2022-11-01 NOTE — Patient Outreach (Signed)
  Care Coordination   11/01/2022 Name: Andre Sherman MRN: [id number] DOB: [birth year]   Care Coordination Outreach Attempts:  An unsuccessful telephone outreach was attempted for a scheduled appointment today.  Follow Up Plan:  Additional outreach attempts will be made to offer the patient care coordination information and services.   Encounter Outcome:  No Answer   Care Coordination Interventions:  No, not indicated    Bary Leriche, RN, MSN [facility]  [facility], [facility] Management Community Coordinator Direct Dial: [phone]  Fax: [phone] Website: [email or web address]

## 2022-11-03 ENCOUNTER — Telehealth: Payer: Self-pay

## 2022-11-03 NOTE — Patient Outreach (Signed)
  Care Coordination   Follow Up Visit Note   11/03/2022 Name: LEV CIOLEK MRN: [id number] DOB: 10-11-1955  DEUNTAE HEFT is a 67 y.o. year old male who sees Chambliss, Estill Batten, MD for primary care. I spoke with  Odis Hollingshead by phone today.  What matters to the patients health and wellness today?  Maintain health    Goals Addressed             This Visit's Progress    Keep blood pressure controlled       Patient Goals/Self Care Activities: -Patient/Caregiver will self-administer medications as prescribed as evidenced by self-report/primary caregiver report  -Patient/Caregiver will attend all scheduled provider appointments as evidenced by clinician review of documented attendance to scheduled appointments and patient/caregiver report -Patient/Caregiver will call provider office for new concerns or questions as evidenced by review of documented incoming telephone call notes and patient report  -Calls provider office for new concerns, questions, or BP outside discussed parameters -Checks BP and records as discussed -Follows a low sodium diet/DASH diet   Patient doing well.  Colonoscopy done and no problems. Blood pressure running 133/79/80.  Discussed HTN management.        SDOH assessments and interventions completed:  Yes  SDOH Interventions Today    Flowsheet Row Most Recent Value  SDOH Interventions   Housing Interventions Intervention Not Indicated  Utilities Interventions Intervention Not Indicated        Care Coordination Interventions:  Yes, provided    Follow up plan: Follow up call scheduled for December    Encounter Outcome:  Patient Visit Completed   Bary Leriche, RN, MSN [place]  [facility], [facility] Management Community Coordinator Direct Dial: [phone]  Fax: [phone] Website: [email or web address]

## 2022-11-03 NOTE — Patient Instructions (Signed)
Visit Information  Thank you for taking time to visit with me today. Please don't hesitate to contact me if I can be of assistance to you.   Following are the goals we discussed today:   Goals Addressed             This Visit's Progress    Keep blood pressure controlled       Patient Goals/Self Care Activities: -Patient/Caregiver will self-administer medications as prescribed as evidenced by self-report/primary caregiver report  -Patient/Caregiver will attend all scheduled provider appointments as evidenced by clinician review of documented attendance to scheduled appointments and patient/caregiver report -Patient/Caregiver will call provider office for new concerns or questions as evidenced by review of documented incoming telephone call notes and patient report  -Calls provider office for new concerns, questions, or BP outside discussed parameters -Checks BP and records as discussed -Follows a low sodium diet/DASH diet   Patient doing well.  Colonoscopy done and no problems. Blood pressure running 133/79/80.  Discussed HTN management.        Our next appointment is by telephone on 02/22/23 at 1030 am  Please call the care guide team at [phone] if you need to cancel or reschedule your appointment.   If you are experiencing a Mental Health or Behavioral Health Crisis or need someone to talk to, please call the Suicide and Crisis Lifeline: 988   The patient verbalized understanding of instructions, educational materials, and care plan provided today and DECLINED offer to receive copy of patient instructions, educational materials, and care plan.   The patient has been provided with contact information for the care management team and has been advised to call with any health related questions or concerns.   Bary Leriche, RN, MSN [facility], [facility] Management Community Coordinator Direct Dial: [phone]  Fax:  [phone] Website: [email or web address]

## 2022-11-22 ENCOUNTER — Ambulatory Visit: Payer: Medicare HMO | Admitting: Family Medicine

## 2022-11-22 NOTE — Progress Notes (Deleted)
    SUBJECTIVE:   CHIEF COMPLAINT / HPI:   Chronic L Leg Pain Using fentanyl patch   Hypertension   Tobacco  Gout   Headache Improved with antihistamine and flonase.   Cerumen Feels is some better but still stopped up.  Using debrox daily in R ear less in L    Bowels Are back to normal   L Foot Area of callus that is painful when he walks on it.  Has been pared down in past by podiatry and also he self removes.  No fever or redness or discharge     PERTINENT  PMH / PSH: *** Patient Active Problem List   Diagnosis Date Noted   Corn of foot 09/20/2022   Hyponatremia 08/26/2022   Allergic rhinitis 08/26/2022   Headache 08/26/2022   Constipation 08/26/2022   Angiodysplasia of cecum 08/08/2022   Urinary frequency 04/01/2020   Hyperlipidemia 01/19/2018   Ganglion 09/05/2017   Renal insufficiency 06/27/2017   Gout 03/02/2016   Hyperglycemia 03/12/2014   Tobacco abuse 04/27/2006   Depression 04/27/2006   Essential hypertension 04/27/2006   Sequelae of injury of muscle and tendon of lower limb 04/27/2006    Current Outpatient Medications  Medication Instructions   acetaminophen (TYLENOL) 1,000 mg, Oral, Daily PRN   allopurinol (ZYLOPRIM) 100 mg, Oral, Daily   atorvastatin (LIPITOR) 40 mg, Oral, Daily   fentaNYL (DURAGESIC) 25 MCG/HR 1 patch, Transdermal, every 72 hours   fentaNYL (DURAGESIC) 25 MCG/HR 1 patch, Transdermal, every 72 hours   fentaNYL (DURAGESIC) 25 MCG/HR 1 patch, Transdermal, every 72 hours   fentaNYL (DURAGESIC) 25 MCG/HR 1 patch, Transdermal, every 72 hours   fluticasone (FLONASE) 50 MCG/ACT nasal spray 2 sprays, Each Nare, Daily   gabapentin (NEURONTIN) 800 MG tablet TAKE 1 TABLET BY MOUTH IN THE MORNING AND TAKE 2 TABLETS EVERY EVENING   lidocaine (LIDODERM) 5 % 1 patch, Transdermal, Every 24 hours, Remove & Discard patch within 12 hours or as directed by MD   lisinopril-hydrochlorothiazide (ZESTORETIC) 20-25 MG tablet 1 tablet, Oral, Daily    loratadine (CLARITIN) 10 mg, Oral, Daily   Multiple Vitamin (MULTIVITAMIN WITH MINERALS) TABS tablet 1 tablet, Daily   nicotine (NICODERM CQ - DOSED IN MG/24 HOURS) 14 mg, Transdermal, Daily   PROAIR HFA 108 (90 Base) MCG/ACT inhaler INHALE 1-2 PUFFS BY MOUTH EVERY 6 HOURS AS NEEDED FOR WHEEZE OR SHORTNESS OF BREATH   tamsulosin (FLOMAX) 0.8 mg, Oral, Daily   traZODone (DESYREL) 25-50 mg, Oral, At bedtime PRN, for sleep   varenicline (CHANTIX) 0.5 mg, Oral, 1 Day/Dose       09/20/2022   11:35 AM 08/26/2022   10:03 AM 08/26/2022    9:32 AM  Vitals with BMI  Height 5\' 10"   5\' 10"   Weight 214 lbs 3 oz  207 lbs  BMI 30.73  29.7  Systolic 104 135 846  Diastolic 82 84 99  Pulse 104 70 76      OBJECTIVE:   There were no vitals taken for this visit.  ***  ASSESSMENT/PLAN:   There are no diagnoses linked to this encounter.   There are no Patient Instructions on file for this visit.   Carney Living, MD [facility]

## 2022-11-23 ENCOUNTER — Other Ambulatory Visit: Payer: Self-pay | Admitting: Family Medicine

## 2022-11-25 ENCOUNTER — Ambulatory Visit: Payer: Medicare (Managed Care)

## 2022-12-06 ENCOUNTER — Encounter: Payer: Self-pay | Admitting: Family Medicine

## 2022-12-06 ENCOUNTER — Other Ambulatory Visit: Payer: Self-pay

## 2022-12-06 ENCOUNTER — Ambulatory Visit: Payer: Medicare HMO | Admitting: Family Medicine

## 2022-12-06 VITALS — BP 137/80 | HR 86 | Ht 70.0 in | Wt 221.0 lb

## 2022-12-06 DIAGNOSIS — Z720 Tobacco use: Secondary | ICD-10-CM

## 2022-12-06 DIAGNOSIS — S86902S Unspecified injury of unspecified muscle(s) and tendon(s) at lower leg level, left leg, sequela: Secondary | ICD-10-CM

## 2022-12-06 DIAGNOSIS — Z23 Encounter for immunization: Secondary | ICD-10-CM

## 2022-12-06 DIAGNOSIS — I10 Essential (primary) hypertension: Secondary | ICD-10-CM | POA: Diagnosis not present

## 2022-12-06 DIAGNOSIS — F3289 Other specified depressive episodes: Secondary | ICD-10-CM | POA: Diagnosis not present

## 2022-12-06 MED ORDER — FENTANYL 25 MCG/HR TD PT72: 1.0000 | MEDICATED_PATCH | TRANSDERMAL | 0 refills | Status: DC

## 2022-12-06 MED ORDER — PROAIR HFA 108 (90 BASE) MCG/ACT IN AERS: 2.0000 | INHALATION_SPRAY | Freq: Four times a day (QID) | RESPIRATORY_TRACT | 1 refills | Status: DC | PRN

## 2022-12-06 NOTE — Progress Notes (Signed)
    SUBJECTIVE:   CHIEF COMPLAINT / HPI:   Chronic L Leg Pain Using fentanyl patches every 3 days.  These allow him to keep active, walking dogs and doing chores  Hypertension Did not bring in his medications but knows names.  Does not check at h ome  Tobacco About 1/2 ppd.  Discouraged that he could ever quit.  L Foot Continues to have area of callus that he pares down himself "Ive gotten good at it"  Did not see podiatry and does not feel he needs to.  No fever or redness or discharge    Depression He feels things are "ok"  Lots of stressors - financial and his sons. The trazodone helps his sleep.  No interested in any counseling at present time  OBJECTIVE:   BP 137/80   Pulse 86   Ht 5\' 10"  (1.778 m)   Wt 221 lb (100.2 kg)   SpO2 94%   BMI 31.71 kg/m   Heart - Regular rate and rhythm.  No murmurs, gallops or rubs.    Lungs - diffuse exp wheezing no focal dullness Wearing compression stocking on injured leg.    ASSESSMENT/PLAN:   Encounter for immunization -     Flu Vaccine Trivalent High Dose (Fluad) -     Pfizer Comirnaty Covid-19 Vaccine 22yrs & older  Sequelae of injury of muscle and tendon of left lower extremity Assessment & Plan: With chronic pain.  Using fentanyl patches appropriately.  PDMP reviewed.  They allow him to keep active.    Gabapentin at higher doses also really helps  Orders: -     fentaNYL; Place 1 patch onto the skin every 3 (three) days.  Dispense: 5 patch; Refill: 0 -     fentaNYL; Place 1 patch onto the skin every 3 (three) days.  Dispense: 5 patch; Refill: 0 -     fentaNYL; Place 1 patch onto the skin every 3 (three) days.  Dispense: 5 patch; Refill: 0 -     fentaNYL; Place 1 patch onto the skin every 3 (three) days.  Dispense: 5 patch; Refill: 0  Essential hypertension Assessment & Plan: At goal.  Continue current medications.    Other depression Assessment & Plan: Stable at chronic low level.  Continue trazadone for sleep.  My  bring up further therapies in future.   Tobacco abuse Assessment & Plan: Some what contemplative.  Tried chantix and counseling with Dr Raymondo Band in past.  May consider trying again    Other orders -     ProAir HFA; Inhale 2 puffs into the lungs every 6 (six) hours as needed for wheezing or shortness of breath.  Dispense: 8.5 each; Refill: 1     Patient Instructions  Good to see you today - Thank you for coming in  Things we discussed today:  Consider contacting Dr Raymondo Band about smoking  Please always bring your medication bottles  Come back to see me in 2-3 months    Carney Living, MD [facility]

## 2022-12-06 NOTE — Assessment & Plan Note (Signed)
Some what contemplative.  Tried chantix and counseling with Dr Raymondo Band in past.  May consider trying again

## 2022-12-06 NOTE — Assessment & Plan Note (Signed)
With chronic pain.  Using fentanyl patches appropriately.  PDMP reviewed.  They allow him to keep active.    Gabapentin at higher doses also really helps

## 2022-12-06 NOTE — Assessment & Plan Note (Signed)
At goal. Continue current medications. 

## 2022-12-06 NOTE — Assessment & Plan Note (Signed)
Stable at chronic low level.  Continue trazadone for sleep.  My bring up further therapies in future.

## 2022-12-06 NOTE — Patient Instructions (Signed)
Good to see you today - Thank you for coming in  Things we discussed today:  Consider contacting Dr Raymondo Band about smoking  Please always bring your medication bottles  Come back to see me in 2-3 months

## 2022-12-16 ENCOUNTER — Other Ambulatory Visit: Payer: Self-pay

## 2022-12-16 ENCOUNTER — Ambulatory Visit: Payer: Medicare HMO

## 2022-12-16 VITALS — Ht 70.0 in | Wt 221.0 lb

## 2022-12-16 DIAGNOSIS — Z0000 Encounter for general adult medical examination without abnormal findings: Secondary | ICD-10-CM

## 2022-12-16 NOTE — Telephone Encounter (Signed)
Pharmacy sent over fax informing PROAIR HFA is no longer available and would like a replacement sent to [facility] on [address] [place]. Penni Bombard CMA
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
## 2022-12-16 NOTE — Patient Instructions (Signed)
Mr. Cassell , Thank you for taking time to come for your Medicare Wellness Visit. I appreciate your ongoing commitment to your health goals. Please review the following plan we discussed and let me know if I can assist you in the future.   Referrals/Orders/Follow-Ups/Clinician Recommendations: Aim for 30 minutes of exercise or brisk walking, 6-8 glasses of water, and 5 servings of fruits and vegetables each day.  This is a list of the screening recommended for you and due dates:  Health Maintenance  Topic Date Due   Screening for Lung Cancer  07/15/2023   Medicare Annual Wellness Visit  12/16/2023   DTaP/Tdap/Td vaccine (4 - Td or Tdap) 06/18/2029   Colon Cancer Screening  08/07/2032   Pneumonia Vaccine  Completed   Flu Shot  Completed   COVID-19 Vaccine  Completed   Hepatitis C Screening  Completed   Zoster (Shingles) Vaccine  Completed   HPV Vaccine  Aged Out   Cologuard (Stool DNA test)  Discontinued    Advanced directives: (ACP Link)Information on Advanced Care Planning can be found at [facility] Advance Health Care Directives Advance Health Care Directives ([email or web address]   Next Medicare Annual Wellness Visit scheduled for next year: Yes

## 2022-12-17 MED ORDER — PROAIR HFA 108 (90 BASE) MCG/ACT IN AERS: 2.0000 | INHALATION_SPRAY | Freq: Four times a day (QID) | RESPIRATORY_TRACT | 1 refills | Status: AC | PRN

## 2022-12-23 ENCOUNTER — Other Ambulatory Visit: Payer: Self-pay | Admitting: Family Medicine

## 2022-12-23 DIAGNOSIS — I10 Essential (primary) hypertension: Secondary | ICD-10-CM

## 2023-01-02 ENCOUNTER — Other Ambulatory Visit: Payer: Self-pay | Admitting: Family Medicine

## 2023-01-02 DIAGNOSIS — I10 Essential (primary) hypertension: Secondary | ICD-10-CM

## 2023-01-09 ENCOUNTER — Ambulatory Visit (HOSPITAL_COMMUNITY)
Admission: EM | Admit: 2023-01-09 | Discharge: 2023-01-09 | Disposition: A | Discharge status: Home / Self Care | Payer: Medicare HMO | Attending: Psychiatry | Admitting: Psychiatry

## 2023-01-09 DIAGNOSIS — F332 Major depressive disorder, recurrent severe without psychotic features: Secondary | ICD-10-CM | POA: Diagnosis present

## 2023-01-09 DIAGNOSIS — F419 Anxiety disorder, unspecified: Secondary | ICD-10-CM | POA: Diagnosis not present
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
## 2023-01-09 NOTE — Discharge Instructions (Addendum)
Based on the information you have provided and the presenting issue, outpatient services with therapy and psychiatry have been recommended.  It is imperative that you follow through with treatment recommendations within 5-7 days from the of discharge to mitigate further risk to your safety and mental well-being. A list of referrals has been provided below to get you started.  You are not limited to the list provided.  In case of an urgent crisis, you may contact the Mobile Crisis Unit with [facility] at [phone].  Outpatient Therapy and Psychiatry for Medicare Recipients  [facility] [address] [place], [place], [address] [phone] phone  [facility] [address] [place], [place], [address] [phone] phone (Aetna, [employer]Health [employer] [place], BCBS, Cigna, [employer], [employer], [facility], BCBS Healthy Blue, Humana, [employer], Medcost, Medicare, Medicaid, Optum, Tricare, UHC, UHC Community Plan, Wellcare)  [facility] [address] [place], [place], [address] [phone] phone  [facility] [address] [place], [place], [address] [phone] phone  [facility] [address] [place], [place], [address] [phone] phone [phone] fax  [facility] [address] [place], [place], [address] [phone] phone  [facility] [address] [place], [place], [address] [phone] phone [phone] fax  [facility] [address] [place], [place], [address] [phone] phone  [facility] [address] [place], [place], [address] [phone] phone  [facility] [address] [place], [place], [address] [phone] phone [phone] fax   
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
## 2023-01-20 ENCOUNTER — Encounter: Payer: Self-pay | Admitting: Family Medicine

## 2023-02-02 ENCOUNTER — Ambulatory Visit: Payer: Self-pay

## 2023-02-02 NOTE — Patient Instructions (Signed)
Visit Information  Thank you for taking time to visit with me today. Please don't hesitate to contact me if I can be of assistance to you.   Following are the goals we discussed today:   Goals Addressed             This Visit's Progress    Keep blood pressure controlled       Patient Goals/Self Care Activities: -Patient/Caregiver will self-administer medications as prescribed as evidenced by self-report/primary caregiver report  -Patient/Caregiver will attend all scheduled provider appointments as evidenced by clinician review of documented attendance to scheduled appointments and patient/caregiver report -Patient/Caregiver will call provider office for new concerns or questions as evidenced by review of documented incoming telephone call notes and patient report  -Calls provider office for new concerns, questions, or BP outside discussed parameters -Checks BP and records as discussed -Follows a low sodium diet/DASH diet   Patient doing good.  He reports blood pressure running less than 140/80.  Reiterated HTN management. Patient reports he has been riding the bus places due to transportation issues.  Discussed [employer]. Patient in agreement to receive information and apply.          Our next appointment is by telephone on 05/04/23 at 1030 am  Please call the care guide team at [phone] if you need to cancel or reschedule your appointment.   If you are experiencing a Mental Health or Behavioral Health Crisis or need someone to talk to, please call the Suicide and Crisis Lifeline: 988   The patient verbalized understanding of instructions, educational materials, and care plan provided today and DECLINED offer to receive copy of patient instructions, educational materials, and care plan.   The patient has been provided with contact information for the care management team and has been advised to call with any health related questions or concerns.   Bary Leriche, RN,  MSN RN Care Manager [facility], Population Health Direct Dial: [phone]  Fax: [phone] Website: [email or web address]

## 2023-02-02 NOTE — Patient Outreach (Signed)
  Care Coordination   Follow Up Visit Note   02/02/2023 Name: Andre Sherman MRN: [id number] DOB: [birth year]  Andre Sherman is a 67 y.o. year old male who sees Chambliss, Estill Batten, MD for primary care. I spoke with  Odis Hollingshead by phone today.  What matters to the patients health and wellness today?  Managing Health    Goals Addressed             This Visit's Progress    Keep blood pressure controlled       Patient Goals/Self Care Activities: -Patient/Caregiver will self-administer medications as prescribed as evidenced by self-report/primary caregiver report  -Patient/Caregiver will attend all scheduled provider appointments as evidenced by clinician review of documented attendance to scheduled appointments and patient/caregiver report -Patient/Caregiver will call provider office for new concerns or questions as evidenced by review of documented incoming telephone call notes and patient report  -Calls provider office for new concerns, questions, or BP outside discussed parameters -Checks BP and records as discussed -Follows a low sodium diet/DASH diet   Patient doing good.  He reports blood pressure running less than 140/80.  Reiterated HTN management. Patient reports he has been riding the bus places due to transportation issues.  Discussed [employer]. Patient in agreement to receive information and apply.          SDOH assessments and interventions completed:  Yes  SDOH Interventions Today    Flowsheet Row Most Recent Value  SDOH Interventions   Food Insecurity Interventions Intervention Not Indicated  Housing Interventions Intervention Not Indicated  Transportation Interventions Intervention Not Indicated  Utilities Interventions Intervention Not Indicated        Care Coordination Interventions:  Yes, provided   Follow up plan: Follow up call scheduled for March    Encounter Outcome:  Patient Visit Completed   Bary Leriche, RN, MSN RN Care  Manager [facility], Population Health Direct Dial: [phone]  Fax: [phone] Website: [email or web address]

## 2023-02-08 ENCOUNTER — Ambulatory Visit: Payer: Medicare HMO | Admitting: Family Medicine

## 2023-02-08 ENCOUNTER — Encounter: Payer: Self-pay | Admitting: Family Medicine

## 2023-02-08 VITALS — BP 112/73 | HR 72 | Ht 70.0 in | Wt 208.8 lb

## 2023-02-08 DIAGNOSIS — Z720 Tobacco use: Secondary | ICD-10-CM

## 2023-02-08 DIAGNOSIS — S86902S Unspecified injury of unspecified muscle(s) and tendon(s) at lower leg level, left leg, sequela: Secondary | ICD-10-CM

## 2023-02-08 DIAGNOSIS — R350 Frequency of micturition: Secondary | ICD-10-CM

## 2023-02-08 DIAGNOSIS — F3289 Other specified depressive episodes: Secondary | ICD-10-CM

## 2023-02-08 DIAGNOSIS — I10 Essential (primary) hypertension: Secondary | ICD-10-CM | POA: Diagnosis not present

## 2023-02-08 MED ORDER — FENTANYL 25 MCG/HR TD PT72
1.0000 | MEDICATED_PATCH | TRANSDERMAL | 0 refills | Status: DC
Start: 2023-02-08 — End: 2023-05-02

## 2023-02-08 MED ORDER — FENTANYL 25 MCG/HR TD PT72: 1.0000 | MEDICATED_PATCH | TRANSDERMAL | 0 refills | Status: DC

## 2023-02-08 NOTE — Assessment & Plan Note (Signed)
Not very interested in quitting at the moment but will work to cut down
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
## 2023-02-08 NOTE — Assessment & Plan Note (Addendum)
He continues to use fentanyl patches appropriately.  They allow him to keep active with his chronic lower extremity injury pain.  Continue 2 month prescription but could lengthen if is depression improves.
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
## 2023-02-08 NOTE — Assessment & Plan Note (Signed)
Stable since seen at [facility].   No suicidal ideation.  Will increase sertraline to 100 mg a day and trazadone to 100 mg at night as needed.  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

## 2023-02-08 NOTE — Assessment & Plan Note (Signed)
At goal today.  Continue current medications.  Last bmet in June 2024

## 2023-02-08 NOTE — Assessment & Plan Note (Signed)
He did not ever follow up with urology but feels this is improved with tamsulosin.   Would check a PSA next visit.  It has fluctuated in the past.   Lab Results  Component Value Date   PSA1 1.4 07/08/2020   PSA1 4.7 (H) 04/01/2020   PSA 0.31 06/02/2008

## 2023-02-09 MED ORDER — TRAZODONE HCL 50 MG PO TABS: 50.0000 mg | ORAL_TABLET | Freq: Every evening | ORAL | 2 refills | Status: DC | PRN

## 2023-02-09 NOTE — Addendum Note (Signed)
Addended by: Pearlean Brownie L on: 02/09/2023 08:10 AM   Modules accepted: Orders

## 2023-03-03 ENCOUNTER — Other Ambulatory Visit: Payer: Self-pay | Admitting: Family Medicine

## 2023-03-03 DIAGNOSIS — R519 Headache, unspecified: Secondary | ICD-10-CM

## 2023-03-08 ENCOUNTER — Other Ambulatory Visit: Payer: Self-pay | Admitting: Family Medicine

## 2023-03-20 ENCOUNTER — Telehealth: Payer: Self-pay

## 2023-03-20 NOTE — Telephone Encounter (Signed)
Patient calls nurse line reporting Fentanyl Patches need a prior authorization.   PA submitted via covermymeds.  Key: [id number]

## 2023-03-22 NOTE — Telephone Encounter (Signed)
Medication approved through 02/28/2024.  Attempted to call pharmacy, however I had to LVM with [facility].   Attempted to call patient, however no answer or option for VM.

## 2023-04-24 ENCOUNTER — Other Ambulatory Visit: Payer: Self-pay | Admitting: Family Medicine

## 2023-05-01 NOTE — Progress Notes (Unsigned)
    SUBJECTIVE:   CHIEF COMPLAINT / HPI:   Chronic L left pain Using fentanyl patches q 72 hours.  Depression  PERTINENT  PMH / PSH: ***  OBJECTIVE:   There were no vitals taken for this visit. ***  General: NAD, pleasant, able to participate in exam Cardiac: RRR, no murmurs. Respiratory: CTAB, normal effort, No wheezes, rales or rhonchi Abdomen: Bowel sounds present, nontender, nondistended Extremities: no edema or cyanosis. Skin: warm and dry, no rashes noted Neuro: alert, no obvious focal deficits Psych: Normal affect and mood  ASSESSMENT/PLAN:   No problem-specific Assessment & Plan notes found for this encounter.     Dr. Elberta Fortis, DO  [facility]    {    This will disappear when note is signed, click to select method of visit    :1}

## 2023-05-02 ENCOUNTER — Encounter: Payer: Self-pay | Admitting: Family Medicine

## 2023-05-02 ENCOUNTER — Ambulatory Visit (INDEPENDENT_AMBULATORY_CARE_PROVIDER_SITE_OTHER): Payer: Medicare HMO | Admitting: Family Medicine

## 2023-05-02 VITALS — BP 140/70 | HR 84 | Ht 70.0 in | Wt 202.0 lb

## 2023-05-02 DIAGNOSIS — S86902S Unspecified injury of unspecified muscle(s) and tendon(s) at lower leg level, left leg, sequela: Secondary | ICD-10-CM

## 2023-05-02 DIAGNOSIS — F17200 Nicotine dependence, unspecified, uncomplicated: Secondary | ICD-10-CM

## 2023-05-02 DIAGNOSIS — I10 Essential (primary) hypertension: Secondary | ICD-10-CM | POA: Diagnosis not present

## 2023-05-02 DIAGNOSIS — F3289 Other specified depressive episodes: Secondary | ICD-10-CM

## 2023-05-02 DIAGNOSIS — F1020 Alcohol dependence, uncomplicated: Secondary | ICD-10-CM | POA: Diagnosis not present

## 2023-05-02 DIAGNOSIS — G44329 Chronic post-traumatic headache, not intractable: Secondary | ICD-10-CM | POA: Diagnosis not present

## 2023-05-02 DIAGNOSIS — F1220 Cannabis dependence, uncomplicated: Secondary | ICD-10-CM | POA: Diagnosis not present

## 2023-05-02 MED ORDER — FENTANYL 25 MCG/HR TD PT72
1.0000 | MEDICATED_PATCH | TRANSDERMAL | 0 refills | Status: DC
Start: 2023-05-29 — End: 2023-07-11

## 2023-05-02 MED ORDER — FENTANYL 25 MCG/HR TD PT72: 1.0000 | MEDICATED_PATCH | TRANSDERMAL | 0 refills | Status: DC

## 2023-05-02 MED ORDER — NICOTINE 21 MG/24HR TD PT24: 21.0000 mg | MEDICATED_PATCH | Freq: Every day | TRANSDERMAL | 3 refills | Status: AC

## 2023-05-02 NOTE — Patient Instructions (Addendum)
 It was wonderful to see you today! Thank you for choosing [facility].   Please bring ALL of your medications with you to every visit.   Today we talked about:  Please schedule with Dr. Raymondo Band for smoking cessation support. I ordered the Nicotine patches that you can use every 24 hours. He can discuss options such as Chantix further For your headaches, I think you have post-concussive syndrome. I think physical therapy would help a lot with your symptoms but I understand it can be financially limiting. Your blood pressure is still a little high. Please keep checking at home with a goal < 130/80. I know you have a large supply of medication that I do not want you to waste it. Please keep an eye on your mood and if you have thoughts of hurting your self please go to the behavioral health urgent care. I refilled your Fentanyl patches, we will continue the current regimen and we can discuss changes in the future.  Please follow up in 2 months  If you haven't already, sign up for My Chart to have easy access to your labs results, and communication with your primary care physician.   We are checking some labs today. If they are abnormal, I will call you. If they are normal, I will send you a MyChart message (if it is active) or a letter in the mail. If you do not hear about your labs in the next 2 weeks, please call the office.  Call the clinic at [phone] if your symptoms worsen or you have any concerns.  Please be sure to schedule follow up at the front desk before you leave today.   Elberta Fortis, DO Family Medicine

## 2023-05-03 NOTE — Assessment & Plan Note (Signed)
 140/70, borderline control. Keep home log and discuss at next appointment

## 2023-05-03 NOTE — Assessment & Plan Note (Signed)
 PHQ:7, denies SI. Will discuss treatment options further at follow up.

## 2023-05-03 NOTE — Assessment & Plan Note (Signed)
 Fentanyl patches q 72 hours x 10+ years. Stable and continues to be functional on current regimen. Will refill x 2 month supply as prior PCP had been doing and discuss refill cycle at follow up. Unlikely to ever come off given prior failed attempts and opioid sensitization.

## 2023-05-04 ENCOUNTER — Telehealth: Payer: Self-pay

## 2023-05-04 ENCOUNTER — Ambulatory Visit: Payer: Self-pay

## 2023-05-04 NOTE — Patient Instructions (Signed)
 Visit Information  Thank you for taking time to visit with me today. Please don't hesitate to contact me if I can be of assistance to you.   Following are the goals we discussed today:   Goals Addressed             This Visit's Progress    COMPLETED: Keep blood pressure controlled       Patient Goals/Self Care Activities: -Patient/Caregiver will self-administer medications as prescribed as evidenced by self-report/primary caregiver report  -Patient/Caregiver will attend all scheduled provider appointments as evidenced by clinician review of documented attendance to scheduled appointments and patient/caregiver report -Patient/Caregiver will call provider office for new concerns or questions as evidenced by review of documented incoming telephone call notes and patient report  -Calls provider office for new concerns, questions, or BP outside discussed parameters -Checks BP and records as discussed -Follows a low sodium diet/DASH diet   Patient doing good.  He reports blood pressure running less than 140/80.  Encouraged continued HTN Management. Discussed case closure as patient is doing well with health self management.  He is agreeable.             If you are experiencing a Mental Health or Behavioral Health Crisis or need someone to talk to, please call the Suicide and Crisis Lifeline: 988   The patient verbalized understanding of instructions, educational materials, and care plan provided today and DECLINED offer to receive copy of patient instructions, educational materials, and care plan.   The patient has been provided with contact information for the care management team and has been advised to call with any health related questions or concerns.   Bary Leriche RN, MSN [facility] Health RN Care Manager Direct Dial: [phone]  Fax: [phone] Website: [email or web address]

## 2023-05-04 NOTE — Patient Outreach (Signed)
 Care Coordination   05/04/2023 Name: Andre Sherman MRN: [id number] DOB: 12-28-1955   Care Coordination Outreach Attempts:  An unsuccessful outreach was attempted for an appointment today.  Follow Up Plan:  Additional outreach attempts will be made to offer the patient complex care management information and services.   Encounter Outcome:  No Answer   Care Coordination Interventions:  No, not indicated    Bary Leriche RN, MSN [facility]  [facility], [facility] Health RN Care Manager Direct Dial: [phone]  Fax: [phone] Website: [email or web address]

## 2023-05-04 NOTE — Patient Outreach (Signed)
 Care Coordination   Follow Up Visit Note   05/04/2023 Name: Andre Sherman MRN: [id number] DOB: 26-Jan-1956  Andre Sherman is a 68 y.o. year old male who sees Elberta Fortis, MD for primary care. I spoke with  Odis Hollingshead by phone today.  What matters to the patients health and wellness today?  Maintain health    Goals Addressed             This Visit's Progress    COMPLETED: Keep blood pressure controlled       Patient Goals/Self Care Activities: -Patient/Caregiver will self-administer medications as prescribed as evidenced by self-report/primary caregiver report  -Patient/Caregiver will attend all scheduled provider appointments as evidenced by clinician review of documented attendance to scheduled appointments and patient/caregiver report -Patient/Caregiver will call provider office for new concerns or questions as evidenced by review of documented incoming telephone call notes and patient report  -Calls provider office for new concerns, questions, or BP outside discussed parameters -Checks BP and records as discussed -Follows a low sodium diet/DASH diet   Patient doing good.  He reports blood pressure running less than 140/80.  Encouraged continued HTN Management. Discussed case closure as patient is doing well with health self management.  He is agreeable.            SDOH assessments and interventions completed:  Yes     Care Coordination Interventions:  Yes, provided   Follow up plan: No further intervention required.   Encounter Outcome:  Patient Visit Completed   Bary Leriche RN, MSN [facility], [facility] RN Care Manager Direct Dial: [phone]  Fax: [phone] Website: [email or web address]

## 2023-05-15 ENCOUNTER — Telehealth: Payer: Self-pay | Admitting: Family Medicine

## 2023-05-15 ENCOUNTER — Telehealth: Payer: Self-pay | Admitting: Pharmacist

## 2023-05-15 DIAGNOSIS — F1020 Alcohol dependence, uncomplicated: Secondary | ICD-10-CM | POA: Diagnosis not present

## 2023-05-15 NOTE — Telephone Encounter (Signed)
 Patient walked in stating he needs help with smoking Cessation.  When to the pharmacy and the patches cost 87.00, which he cannot afford

## 2023-05-15 NOTE — Telephone Encounter (Signed)
 Patient contacted for follow/up of tobacco intake reduction / cessation attempt.  Patient was seen today by PCP and when departing, asked for assistance from me on tobacco cessation.  I was not immediately available and returned a call to his home.   I last spoke to him RE tobacco use in late 2023.  At that time he reported decreasing use to ~ 5 cigarettes per day.   Since last contact patient reports he has increased his intake, slowly back to ~ 1 ppd of Crown Lights.   Medications currently being used;  None Varenicline - Has some supply remaining Nicotine patch - states he cannot afford (priced at $81 at his pharmacy) Discussed possibility of combining patches with varenicline.   Continues to rates IMPORTANCE of quitting tobacco as high.  Continues to rate CONFIDENCE of quitting tobacco as high.   Most common triggers to use tobacco include; Habit   Motivation to quit: breathing, sense of accomplishment Provided information on 1 800-QUIT NOW support program, where he may be able to get  both support AND nicotine patches as a starter supply.   Patient was appreciative of the discussion and verbalized positive vibes of treatment plan to quit completely.   Total time with patient call and documentation of interaction: 13 minutes. Follow-up phone call planned: None at this time.

## 2023-05-16 NOTE — Telephone Encounter (Signed)
 Reviewed and agree with Dr Macky Lower plan.

## 2023-05-17 DIAGNOSIS — F1020 Alcohol dependence, uncomplicated: Secondary | ICD-10-CM | POA: Diagnosis not present

## 2023-05-17 DIAGNOSIS — F1220 Cannabis dependence, uncomplicated: Secondary | ICD-10-CM | POA: Diagnosis not present

## 2023-05-19 DIAGNOSIS — F1020 Alcohol dependence, uncomplicated: Secondary | ICD-10-CM | POA: Diagnosis not present

## 2023-05-19 DIAGNOSIS — F1220 Cannabis dependence, uncomplicated: Secondary | ICD-10-CM | POA: Diagnosis not present

## 2023-05-22 DIAGNOSIS — F1020 Alcohol dependence, uncomplicated: Secondary | ICD-10-CM | POA: Diagnosis not present

## 2023-05-22 DIAGNOSIS — F1220 Cannabis dependence, uncomplicated: Secondary | ICD-10-CM | POA: Diagnosis not present

## 2023-05-23 DIAGNOSIS — F1020 Alcohol dependence, uncomplicated: Secondary | ICD-10-CM | POA: Diagnosis not present

## 2023-05-23 DIAGNOSIS — F1220 Cannabis dependence, uncomplicated: Secondary | ICD-10-CM | POA: Diagnosis not present

## 2023-05-24 DIAGNOSIS — F1220 Cannabis dependence, uncomplicated: Secondary | ICD-10-CM | POA: Diagnosis not present

## 2023-05-24 DIAGNOSIS — F1020 Alcohol dependence, uncomplicated: Secondary | ICD-10-CM | POA: Diagnosis not present

## 2023-05-26 DIAGNOSIS — F1020 Alcohol dependence, uncomplicated: Secondary | ICD-10-CM | POA: Diagnosis not present

## 2023-05-26 DIAGNOSIS — F1220 Cannabis dependence, uncomplicated: Secondary | ICD-10-CM | POA: Diagnosis not present

## 2023-05-29 DIAGNOSIS — F1220 Cannabis dependence, uncomplicated: Secondary | ICD-10-CM | POA: Diagnosis not present

## 2023-05-29 DIAGNOSIS — F1020 Alcohol dependence, uncomplicated: Secondary | ICD-10-CM | POA: Diagnosis not present

## 2023-05-30 DIAGNOSIS — F1220 Cannabis dependence, uncomplicated: Secondary | ICD-10-CM | POA: Diagnosis not present

## 2023-05-30 DIAGNOSIS — F1020 Alcohol dependence, uncomplicated: Secondary | ICD-10-CM | POA: Diagnosis not present

## 2023-05-31 DIAGNOSIS — F1020 Alcohol dependence, uncomplicated: Secondary | ICD-10-CM | POA: Diagnosis not present

## 2023-05-31 DIAGNOSIS — F1220 Cannabis dependence, uncomplicated: Secondary | ICD-10-CM | POA: Diagnosis not present

## 2023-06-02 DIAGNOSIS — F1220 Cannabis dependence, uncomplicated: Secondary | ICD-10-CM | POA: Diagnosis not present

## 2023-06-02 DIAGNOSIS — F1020 Alcohol dependence, uncomplicated: Secondary | ICD-10-CM | POA: Diagnosis not present

## 2023-06-05 DIAGNOSIS — F1020 Alcohol dependence, uncomplicated: Secondary | ICD-10-CM | POA: Diagnosis not present

## 2023-06-05 DIAGNOSIS — F1220 Cannabis dependence, uncomplicated: Secondary | ICD-10-CM | POA: Diagnosis not present

## 2023-06-06 DIAGNOSIS — F1220 Cannabis dependence, uncomplicated: Secondary | ICD-10-CM | POA: Diagnosis not present

## 2023-06-06 DIAGNOSIS — F1020 Alcohol dependence, uncomplicated: Secondary | ICD-10-CM | POA: Diagnosis not present

## 2023-06-07 DIAGNOSIS — F1220 Cannabis dependence, uncomplicated: Secondary | ICD-10-CM | POA: Diagnosis not present

## 2023-06-07 DIAGNOSIS — F1020 Alcohol dependence, uncomplicated: Secondary | ICD-10-CM | POA: Diagnosis not present

## 2023-06-09 DIAGNOSIS — F1220 Cannabis dependence, uncomplicated: Secondary | ICD-10-CM | POA: Diagnosis not present

## 2023-06-09 DIAGNOSIS — F1020 Alcohol dependence, uncomplicated: Secondary | ICD-10-CM | POA: Diagnosis not present

## 2023-06-12 DIAGNOSIS — F1020 Alcohol dependence, uncomplicated: Secondary | ICD-10-CM | POA: Diagnosis not present

## 2023-06-12 DIAGNOSIS — F1220 Cannabis dependence, uncomplicated: Secondary | ICD-10-CM | POA: Diagnosis not present

## 2023-06-13 DIAGNOSIS — F1220 Cannabis dependence, uncomplicated: Secondary | ICD-10-CM | POA: Diagnosis not present

## 2023-06-13 DIAGNOSIS — F1020 Alcohol dependence, uncomplicated: Secondary | ICD-10-CM | POA: Diagnosis not present

## 2023-06-14 DIAGNOSIS — F1020 Alcohol dependence, uncomplicated: Secondary | ICD-10-CM | POA: Diagnosis not present

## 2023-06-14 DIAGNOSIS — F1220 Cannabis dependence, uncomplicated: Secondary | ICD-10-CM | POA: Diagnosis not present

## 2023-06-16 DIAGNOSIS — F1220 Cannabis dependence, uncomplicated: Secondary | ICD-10-CM | POA: Diagnosis not present

## 2023-06-16 DIAGNOSIS — F1020 Alcohol dependence, uncomplicated: Secondary | ICD-10-CM | POA: Diagnosis not present

## 2023-06-19 DIAGNOSIS — F1220 Cannabis dependence, uncomplicated: Secondary | ICD-10-CM | POA: Diagnosis not present

## 2023-06-19 DIAGNOSIS — F1020 Alcohol dependence, uncomplicated: Secondary | ICD-10-CM | POA: Diagnosis not present

## 2023-06-21 DIAGNOSIS — F1220 Cannabis dependence, uncomplicated: Secondary | ICD-10-CM | POA: Diagnosis not present

## 2023-06-21 DIAGNOSIS — F1020 Alcohol dependence, uncomplicated: Secondary | ICD-10-CM | POA: Diagnosis not present

## 2023-06-23 DIAGNOSIS — F1220 Cannabis dependence, uncomplicated: Secondary | ICD-10-CM | POA: Diagnosis not present

## 2023-06-23 DIAGNOSIS — F1020 Alcohol dependence, uncomplicated: Secondary | ICD-10-CM | POA: Diagnosis not present

## 2023-06-26 DIAGNOSIS — F1020 Alcohol dependence, uncomplicated: Secondary | ICD-10-CM | POA: Diagnosis not present

## 2023-06-26 DIAGNOSIS — F1220 Cannabis dependence, uncomplicated: Secondary | ICD-10-CM | POA: Diagnosis not present

## 2023-06-27 DIAGNOSIS — F1220 Cannabis dependence, uncomplicated: Secondary | ICD-10-CM | POA: Diagnosis not present

## 2023-06-27 DIAGNOSIS — F1020 Alcohol dependence, uncomplicated: Secondary | ICD-10-CM | POA: Diagnosis not present

## 2023-06-28 DIAGNOSIS — F1020 Alcohol dependence, uncomplicated: Secondary | ICD-10-CM | POA: Diagnosis not present

## 2023-06-28 DIAGNOSIS — F1220 Cannabis dependence, uncomplicated: Secondary | ICD-10-CM | POA: Diagnosis not present

## 2023-06-30 DIAGNOSIS — F1220 Cannabis dependence, uncomplicated: Secondary | ICD-10-CM | POA: Diagnosis not present

## 2023-06-30 DIAGNOSIS — F1020 Alcohol dependence, uncomplicated: Secondary | ICD-10-CM | POA: Diagnosis not present

## 2023-07-03 DIAGNOSIS — F1020 Alcohol dependence, uncomplicated: Secondary | ICD-10-CM | POA: Diagnosis not present

## 2023-07-03 DIAGNOSIS — F1220 Cannabis dependence, uncomplicated: Secondary | ICD-10-CM | POA: Diagnosis not present

## 2023-07-03 NOTE — Progress Notes (Deleted)
    SUBJECTIVE:   CHIEF COMPLAINT / HPI:   Depression  BP check - Medications: *** - Compliance: *** - Checking BP at home: *** - Denies any SOB, CP, vision changes, LE edema, medication SEs, or symptoms of hypotension   PERTINENT  PMH / PSH: ***  OBJECTIVE:   There were no vitals taken for this visit. ***  General: NAD, pleasant, able to participate in exam Cardiac: RRR, no murmurs. Respiratory: CTAB, normal effort, No wheezes, rales or rhonchi Abdomen: Bowel sounds present, nontender, nondistended Extremities: no edema or cyanosis. Skin: warm and dry, no rashes noted Neuro: alert, no obvious focal deficits Psych: Normal affect and mood  ASSESSMENT/PLAN:   No problem-specific Assessment & Plan notes found for this encounter.     Dr. Jonne Netters, DO [facility]    {    This will disappear when note is signed, click to select method of visit    :1}

## 2023-07-04 ENCOUNTER — Ambulatory Visit: Admitting: Family Medicine

## 2023-07-04 DIAGNOSIS — F1020 Alcohol dependence, uncomplicated: Secondary | ICD-10-CM | POA: Diagnosis not present

## 2023-07-04 DIAGNOSIS — F1220 Cannabis dependence, uncomplicated: Secondary | ICD-10-CM | POA: Diagnosis not present

## 2023-07-05 DIAGNOSIS — F1020 Alcohol dependence, uncomplicated: Secondary | ICD-10-CM | POA: Diagnosis not present

## 2023-07-05 DIAGNOSIS — F1220 Cannabis dependence, uncomplicated: Secondary | ICD-10-CM | POA: Diagnosis not present

## 2023-07-07 DIAGNOSIS — F1220 Cannabis dependence, uncomplicated: Secondary | ICD-10-CM | POA: Diagnosis not present

## 2023-07-07 DIAGNOSIS — F1020 Alcohol dependence, uncomplicated: Secondary | ICD-10-CM | POA: Diagnosis not present

## 2023-07-10 DIAGNOSIS — F1020 Alcohol dependence, uncomplicated: Secondary | ICD-10-CM | POA: Diagnosis not present

## 2023-07-10 DIAGNOSIS — F1220 Cannabis dependence, uncomplicated: Secondary | ICD-10-CM | POA: Diagnosis not present

## 2023-07-10 NOTE — Progress Notes (Unsigned)
    SUBJECTIVE:   CHIEF COMPLAINT / HPI:   Depression  BP check - Medications: *** - Compliance: *** - Checking BP at home: *** - Denies any SOB, CP, vision changes, LE edema, medication SEs, or symptoms of hypotension  Abnormal Cologuard?     PERTINENT  PMH / PSH: ***  OBJECTIVE:   There were no vitals taken for this visit. ***  General: NAD, pleasant, able to participate in exam Cardiac: RRR, no murmurs. Respiratory: CTAB, normal effort, No wheezes, rales or rhonchi Abdomen: Bowel sounds present, nontender, nondistended Extremities: no edema or cyanosis. Skin: warm and dry, no rashes noted Neuro: alert, no obvious focal deficits Psych: Normal affect and mood  ASSESSMENT/PLAN:   No problem-specific Assessment & Plan notes found for this encounter.     Dr. Jonne Netters, DO [facility]    {    This will disappear when note is signed, click to select method of visit    :1}

## 2023-07-11 ENCOUNTER — Encounter: Payer: Self-pay | Admitting: Family Medicine

## 2023-07-11 ENCOUNTER — Ambulatory Visit: Payer: Self-pay | Admitting: Family Medicine

## 2023-07-11 ENCOUNTER — Ambulatory Visit (INDEPENDENT_AMBULATORY_CARE_PROVIDER_SITE_OTHER): Admitting: Family Medicine

## 2023-07-11 VITALS — BP 104/58 | HR 80 | Wt 202.0 lb

## 2023-07-11 DIAGNOSIS — F1220 Cannabis dependence, uncomplicated: Secondary | ICD-10-CM | POA: Diagnosis not present

## 2023-07-11 DIAGNOSIS — S86902S Unspecified injury of unspecified muscle(s) and tendon(s) at lower leg level, left leg, sequela: Secondary | ICD-10-CM

## 2023-07-11 DIAGNOSIS — E781 Pure hyperglyceridemia: Secondary | ICD-10-CM | POA: Diagnosis not present

## 2023-07-11 DIAGNOSIS — I10 Essential (primary) hypertension: Secondary | ICD-10-CM

## 2023-07-11 DIAGNOSIS — F1020 Alcohol dependence, uncomplicated: Secondary | ICD-10-CM | POA: Diagnosis not present

## 2023-07-11 DIAGNOSIS — L97521 Non-pressure chronic ulcer of other part of left foot limited to breakdown of skin: Secondary | ICD-10-CM

## 2023-07-11 DIAGNOSIS — M436 Torticollis: Secondary | ICD-10-CM

## 2023-07-11 DIAGNOSIS — F17200 Nicotine dependence, unspecified, uncomplicated: Secondary | ICD-10-CM

## 2023-07-11 DIAGNOSIS — F3289 Other specified depressive episodes: Secondary | ICD-10-CM

## 2023-07-11 DIAGNOSIS — L84 Corns and callosities: Secondary | ICD-10-CM | POA: Diagnosis not present

## 2023-07-11 LAB — POCT GLYCOSYLATED HEMOGLOBIN (HGB A1C): Hemoglobin A1C: 5.8 % — AB (ref 4.0–5.6)

## 2023-07-11 MED ORDER — FENTANYL 25 MCG/HR TD PT72: 1.0000 | MEDICATED_PATCH | TRANSDERMAL | 0 refills | Status: DC

## 2023-07-11 NOTE — Assessment & Plan Note (Signed)
 Recurrent, likely callus due to sensory loss from prior injury.  Advised offloading the area with insoles and donut cushion.  May need debridement of the area, will refer to podiatry for further evaluation. -Referral to podiatry

## 2023-07-11 NOTE — Assessment & Plan Note (Signed)
 Significantly improved, reports resolution of his symptoms and he has been getting out of the house and working more.  Denies SI or need for medication.

## 2023-07-11 NOTE — Assessment & Plan Note (Signed)
 Doing well, PDMP reviewed and appropriate.  Will refill fentanyl  patches every 72 hours x 3 months.

## 2023-07-11 NOTE — Assessment & Plan Note (Signed)
 104/58, home BP readings normal but some concern for overtreatment.  Advised to monitor for any low BP or symptoms of hypotension and return to care sooner if needed.  Continue lisinopril -HCTZ daily

## 2023-07-11 NOTE — Assessment & Plan Note (Signed)
 On atorvastatin  40 mg daily. -Lipid panel, BMP

## 2023-07-11 NOTE — Patient Instructions (Addendum)
 It was wonderful to see you today! Thank you for choosing [facility].   Please bring ALL of your medications with you to every visit.   Today we talked about:  Please continue take your blood pressure medication but I want you to keep an eye on your blood pressure at home.  If the top number is in the low 100s or 90s please let me know as we may be overtreating her blood pressure.  If you also develop any symptoms such as dizziness or feeling like you are in a pass out with standing I could also be an indication we need to reduce your medication. Congratulations on cutting back smoking!  As we talked about please set a weekly goal to reduce the amount of cigarettes you are smoking and try even to set a quit date if you can. I attached some neck exercises that you can do every single day for the next 6 weeks.  If your symptoms are not improved then we can consider the neck step for tackling your neck pain. I am referring you to podiatry because as we mentioned the area on your foot needs to be debrided by them.  I do think that it needs close monitoring given you have an ulcer on the top of your foot currently.  Please do not use hydrogen peroxide over the areas instead use soap and water and check your feet daily to make sure there is no pus drainage.  If you notice infection please return to care sooner.  Please follow up in 3 months  If you haven't already, sign up for My Chart to have easy access to your labs results, and communication with your primary care physician.   We are checking some labs today. If they are abnormal, I will call you. If they are normal, I will send you a MyChart message (if it is active) or a letter in the mail. If you do not hear about your labs in the next 2 weeks, please call the office.  Call the clinic at [phone] if your symptoms worsen or you have any concerns.  Please be sure to schedule follow up at the front desk before you leave today.    Jonne Netters, DO Family Medicine

## 2023-07-12 DIAGNOSIS — F1020 Alcohol dependence, uncomplicated: Secondary | ICD-10-CM | POA: Diagnosis not present

## 2023-07-12 DIAGNOSIS — F1220 Cannabis dependence, uncomplicated: Secondary | ICD-10-CM | POA: Diagnosis not present

## 2023-07-12 LAB — LIPID PANEL
Chol/HDL Ratio: 2.9 ratio (ref 0.0–5.0)
Cholesterol, Total: 143 mg/dL (ref 100–199)
HDL: 49 mg/dL (ref >39)
LDL Chol Calc (NIH): 75 mg/dL (ref 0–99)
Triglycerides: 102 mg/dL (ref 0–149)
VLDL Cholesterol Cal: 19 mg/dL (ref 5–40)

## 2023-07-12 LAB — BASIC METABOLIC PANEL WITH GFR
BUN/Creatinine Ratio: 18 (ref 10–24)
BUN: 19 mg/dL (ref 8–27)
CO2: 25 mmol/L (ref 20–29)
Calcium: 10.2 mg/dL (ref 8.6–10.2)
Chloride: 99 mmol/L (ref 96–106)
Creatinine, Ser: 1.06 mg/dL (ref 0.76–1.27)
Glucose: 91 mg/dL (ref 70–99)
Potassium: 5.2 mmol/L (ref 3.5–5.2)
Sodium: 141 mmol/L (ref 134–144)
eGFR: 77 mL/min/{1.73_m2} (ref >59)

## 2023-07-14 DIAGNOSIS — F1020 Alcohol dependence, uncomplicated: Secondary | ICD-10-CM | POA: Diagnosis not present

## 2023-07-14 DIAGNOSIS — F1220 Cannabis dependence, uncomplicated: Secondary | ICD-10-CM | POA: Diagnosis not present

## 2023-07-17 DIAGNOSIS — F1020 Alcohol dependence, uncomplicated: Secondary | ICD-10-CM | POA: Diagnosis not present

## 2023-07-17 DIAGNOSIS — F1220 Cannabis dependence, uncomplicated: Secondary | ICD-10-CM | POA: Diagnosis not present

## 2023-07-18 DIAGNOSIS — F1020 Alcohol dependence, uncomplicated: Secondary | ICD-10-CM | POA: Diagnosis not present

## 2023-07-18 DIAGNOSIS — F1220 Cannabis dependence, uncomplicated: Secondary | ICD-10-CM | POA: Diagnosis not present

## 2023-07-19 ENCOUNTER — Telehealth: Payer: Self-pay | Admitting: Pharmacist

## 2023-07-19 DIAGNOSIS — F1220 Cannabis dependence, uncomplicated: Secondary | ICD-10-CM | POA: Diagnosis not present

## 2023-07-19 DIAGNOSIS — F1020 Alcohol dependence, uncomplicated: Secondary | ICD-10-CM | POA: Diagnosis not present

## 2023-07-19 NOTE — Telephone Encounter (Signed)
 Attempted to contact patient for follow-up of tobacco use disorder and potential for intake reduction.   Recently documented use of ~ 10 cigs per day at PCP visit (07/11/2023).  Left HIPAA compliant voice mail requesting call back to direct phone: [phone]  Total time with patient call and documentation of interaction: 5 minutes.  Follow-up phone call planned: 6-8 weeks

## 2023-07-21 DIAGNOSIS — F1020 Alcohol dependence, uncomplicated: Secondary | ICD-10-CM | POA: Diagnosis not present

## 2023-07-21 DIAGNOSIS — F1220 Cannabis dependence, uncomplicated: Secondary | ICD-10-CM | POA: Diagnosis not present

## 2023-07-24 DIAGNOSIS — F1020 Alcohol dependence, uncomplicated: Secondary | ICD-10-CM | POA: Diagnosis not present

## 2023-07-24 DIAGNOSIS — F1220 Cannabis dependence, uncomplicated: Secondary | ICD-10-CM | POA: Diagnosis not present

## 2023-07-25 DIAGNOSIS — F1020 Alcohol dependence, uncomplicated: Secondary | ICD-10-CM | POA: Diagnosis not present

## 2023-07-25 DIAGNOSIS — F1220 Cannabis dependence, uncomplicated: Secondary | ICD-10-CM | POA: Diagnosis not present

## 2023-07-26 DIAGNOSIS — F1020 Alcohol dependence, uncomplicated: Secondary | ICD-10-CM | POA: Diagnosis not present

## 2023-07-26 DIAGNOSIS — F1220 Cannabis dependence, uncomplicated: Secondary | ICD-10-CM | POA: Diagnosis not present

## 2023-07-28 DIAGNOSIS — F1220 Cannabis dependence, uncomplicated: Secondary | ICD-10-CM | POA: Diagnosis not present

## 2023-07-28 DIAGNOSIS — F1020 Alcohol dependence, uncomplicated: Secondary | ICD-10-CM | POA: Diagnosis not present

## 2023-07-31 DIAGNOSIS — F1220 Cannabis dependence, uncomplicated: Secondary | ICD-10-CM | POA: Diagnosis not present

## 2023-07-31 DIAGNOSIS — F1020 Alcohol dependence, uncomplicated: Secondary | ICD-10-CM | POA: Diagnosis not present

## 2023-08-01 DIAGNOSIS — F1020 Alcohol dependence, uncomplicated: Secondary | ICD-10-CM | POA: Diagnosis not present

## 2023-08-01 DIAGNOSIS — F1220 Cannabis dependence, uncomplicated: Secondary | ICD-10-CM | POA: Diagnosis not present

## 2023-08-02 DIAGNOSIS — F1220 Cannabis dependence, uncomplicated: Secondary | ICD-10-CM | POA: Diagnosis not present

## 2023-08-02 DIAGNOSIS — F1020 Alcohol dependence, uncomplicated: Secondary | ICD-10-CM | POA: Diagnosis not present

## 2023-08-03 DIAGNOSIS — F1020 Alcohol dependence, uncomplicated: Secondary | ICD-10-CM | POA: Diagnosis not present

## 2023-08-03 DIAGNOSIS — F1220 Cannabis dependence, uncomplicated: Secondary | ICD-10-CM | POA: Diagnosis not present

## 2023-08-04 DIAGNOSIS — F1020 Alcohol dependence, uncomplicated: Secondary | ICD-10-CM | POA: Diagnosis not present

## 2023-08-04 DIAGNOSIS — F1220 Cannabis dependence, uncomplicated: Secondary | ICD-10-CM | POA: Diagnosis not present

## 2023-08-14 DIAGNOSIS — F1020 Alcohol dependence, uncomplicated: Secondary | ICD-10-CM | POA: Diagnosis not present

## 2023-08-25 ENCOUNTER — Other Ambulatory Visit: Payer: Self-pay

## 2023-08-25 MED ORDER — ATORVASTATIN CALCIUM 40 MG PO TABS: 40.0000 mg | ORAL_TABLET | Freq: Every day | ORAL | 3 refills | Status: AC

## 2023-08-28 ENCOUNTER — Ambulatory Visit: Admitting: Podiatry

## 2023-08-29 DIAGNOSIS — H5213 Myopia, bilateral: Secondary | ICD-10-CM | POA: Diagnosis not present

## 2023-08-29 DIAGNOSIS — H52209 Unspecified astigmatism, unspecified eye: Secondary | ICD-10-CM | POA: Diagnosis not present

## 2023-08-29 DIAGNOSIS — H524 Presbyopia: Secondary | ICD-10-CM | POA: Diagnosis not present

## 2023-08-29 DIAGNOSIS — H2513 Age-related nuclear cataract, bilateral: Secondary | ICD-10-CM | POA: Diagnosis not present

## 2023-08-31 ENCOUNTER — Telehealth: Payer: Self-pay | Admitting: Pharmacist

## 2023-08-31 DIAGNOSIS — Z720 Tobacco use: Secondary | ICD-10-CM

## 2023-08-31 NOTE — Telephone Encounter (Signed)
 Patient contacted for follow/up of tobacco intake reduction / cessation attempt.   Since last contact patient reports doing much better.  He volunteered that he has too many reasons to quit smoking and was emphatic in saying that he has to quit. He has a plan to quit by the end of the month - states he would like to quit by his [birth year].   Medications he plans to use in his quit plan include;  Nicotine  Gum - 4mg  PRN craving   Continues to rates IMPORTANCE of quitting tobacco as high.  Continues to rate CONFIDENCE of quitting tobacco as high.   Motivation to quit: health - breathing  We concluded the conversation by him saying I look forward to telling you that I quit He thanked me for the call.    Total time with patient call and documentation of interaction: 12 minutes. Follow-up phone call planned: Early August

## 2023-08-31 NOTE — Assessment & Plan Note (Signed)
 Patient contacted for follow/up of tobacco intake reduction / cessation attempt.   Since last contact patient reports doing much better.  He volunteered that he has too many reasons to quit smoking and was emphatic in saying that he has to quit. He has a plan to quit by the end of the month - states he would like to quit by his birthday [birth year].   Medications he plans to use in his quit plan include;  Nicotine  Gum - 4mg  PRN craving   Continues to rates IMPORTANCE of quitting tobacco as high.  Continues to rate CONFIDENCE of quitting tobacco as high.   Motivation to quit: health - breathing  We concluded the conversation by him saying I look forward to telling you that I quit He thanked me for the call.

## 2023-08-31 NOTE — Telephone Encounter (Signed)
-----   Message from Maude Lagos sent at 07/19/2023  2:50 PM EDT ----- Regarding: Tobacco Intake Assessment Mid- May 2025 smoking reported as 10 cigs per day.

## 2023-09-04 NOTE — Telephone Encounter (Signed)
 Reviewed and agree with Dr Macky Lower plan.

## 2023-09-06 ENCOUNTER — Other Ambulatory Visit: Payer: Self-pay | Admitting: Family Medicine

## 2023-09-12 ENCOUNTER — Other Ambulatory Visit: Payer: Self-pay | Admitting: *Deleted

## 2023-09-12 MED ORDER — TAMSULOSIN HCL 0.4 MG PO CAPS: 0.8000 mg | ORAL_CAPSULE | Freq: Every day | ORAL | 3 refills | Status: AC

## 2023-10-06 ENCOUNTER — Telehealth: Payer: Self-pay | Admitting: Pharmacist
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
## 2023-10-06 NOTE — Telephone Encounter (Signed)
-----   Message from Maude Lagos sent at 08/31/2023  4:43 PM EDT ----- Regarding: FW: Tobacco Intake Assessment Goal was to quit by end of July   Smoking ~ 6-7 cigs per day in early July. ----- Message ----- From: Damascus Feldpausch G, RPH-CPP Sent: 08/28/2023  12:00 AM EDT To: Maude KANDICE Lagos, RPH-CPP Subject: Tobacco Intake Assessment                      Mid- May 2025 smoking reported as 10 cigs per day.

## 2023-10-09 NOTE — Telephone Encounter (Signed)
 Reviewed and agree with Dr Rennis plan.

## 2023-10-10 ENCOUNTER — Ambulatory Visit (INDEPENDENT_AMBULATORY_CARE_PROVIDER_SITE_OTHER): Admitting: Family Medicine

## 2023-10-10 ENCOUNTER — Encounter: Payer: Self-pay | Admitting: Family Medicine

## 2023-10-10 VITALS — BP 128/77 | HR 72 | Ht 70.0 in | Wt 199.0 lb

## 2023-10-10 DIAGNOSIS — S86902S Unspecified injury of unspecified muscle(s) and tendon(s) at lower leg level, left leg, sequela: Secondary | ICD-10-CM

## 2023-10-10 DIAGNOSIS — J449 Chronic obstructive pulmonary disease, unspecified: Secondary | ICD-10-CM

## 2023-10-10 DIAGNOSIS — Z720 Tobacco use: Secondary | ICD-10-CM | POA: Diagnosis not present

## 2023-10-10 DIAGNOSIS — I10 Essential (primary) hypertension: Secondary | ICD-10-CM | POA: Diagnosis not present

## 2023-10-10 MED ORDER — FENTANYL 25 MCG/HR TD PT72
1.0000 | MEDICATED_PATCH | TRANSDERMAL | 0 refills | Status: DC
Start: 2023-11-16 — End: 2024-01-04

## 2023-10-10 MED ORDER — FENTANYL 25 MCG/HR TD PT72: 1.0000 | MEDICATED_PATCH | TRANSDERMAL | 0 refills | Status: DC

## 2023-10-10 MED ORDER — UMECLIDINIUM-VILANTEROL 62.5-25 MCG/ACT IN AEPB: 1.0000 | INHALATION_SPRAY | Freq: Every day | RESPIRATORY_TRACT | 1 refills | Status: DC
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
## 2023-10-10 NOTE — Assessment & Plan Note (Signed)
 128/77, well-controlled on current regimen.

## 2023-10-10 NOTE — Assessment & Plan Note (Signed)
 Followed by Dr. Koval for cessation support, encouraged goalsetting and use of nicotine  replacement therapies. -Annual low-dose CT for lung cancer screening

## 2023-10-10 NOTE — Assessment & Plan Note (Signed)
 PDMP reviewed and appropriate, refilled fentanyl  25 mcg/hour patch every 72 hours for the next 3 months. -Annual UDS

## 2023-10-10 NOTE — Patient Instructions (Addendum)
 It was wonderful to see you today! Thank you for choosing [facility].   Please bring ALL of your medications with you to every visit.   Today we talked about:  I refilled your pain medication today, please continue to use the patches as prescribed for the next 3 months and follow-up sooner if needed. I ordered the CT for lung cancer screening.  Our office scheduled this for you so please have it done at [facility].  Will follow-up with you regarding the results. I also prescribed an inhaler called Anoro that you should take 1 puff 1 time daily to help with your COPD.  I think this will significantly help with your wheezing and shortness of breath.  If it is not covered by insurance I will find an alternative that works with our pharmacy team. Please try to continue to reduce the amount of cigarettes you are smoking daily and set a goal to be down to 5 cigarettes by the end of the month if possible.  I know this is a difficult process but any goalsetting you can do will help reduce your intake.  Please follow up in 3 months   If you haven't already, sign up for My Chart to have easy access to your labs results, and communication with your primary care physician.   We are checking some labs today. If they are abnormal, I will call you. If they are normal, I will send you a MyChart message (if it is active) or a letter in the mail. If you do not hear about your labs in the next 2 weeks, please call the office.  Call the clinic at [phone] if your symptoms worsen or you have any concerns.  Please be sure to schedule follow up at the front desk before you leave today.   Izetta Nap, DO Family Medicine

## 2023-10-12 ENCOUNTER — Ambulatory Visit: Payer: Self-pay | Admitting: Family Medicine

## 2023-10-12 LAB — TOXASSURE SELECT 12(MW)

## 2023-10-24 ENCOUNTER — Ambulatory Visit (HOSPITAL_BASED_OUTPATIENT_CLINIC_OR_DEPARTMENT_OTHER)

## 2023-11-09 ENCOUNTER — Telehealth: Payer: Self-pay | Admitting: Pharmacist

## 2023-11-09 NOTE — Telephone Encounter (Signed)
 Duplicate/error

## 2023-11-09 NOTE — Telephone Encounter (Signed)
-----   Message from Maude Lagos sent at 10/06/2023  4:46 PM EDT ----- Regarding: FW: Tobacco Intake Assessment Quit or Progress? ----- Message ----- From: Ryden Wainer G, RPH-CPP Sent: 09/29/2023  12:00 AM EDT To: Maude KANDICE Lagos, RPH-CPP Subject: FW: Tobacco Intake Assessment                  Goal was to quit by end of July   Smoking ~ 6-7 cigs per day in early July. ----- Message ----- From: [place] Maude KANDICE, RPH-CPP Sent: 08/28/2023  12:00 AM EDT To: Maude KANDICE Lagos, RPH-CPP Subject: Tobacco Intake Assessment                      Mid- May 2025 smoking reported as 10 cigs per day.
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
## 2023-11-10 NOTE — Telephone Encounter (Signed)
 Reviewed and agree with Dr Rennis plan.

## 2023-11-22 ENCOUNTER — Ambulatory Visit

## 2023-12-09 ENCOUNTER — Other Ambulatory Visit: Payer: Self-pay | Admitting: Family Medicine

## 2023-12-09 DIAGNOSIS — J449 Chronic obstructive pulmonary disease, unspecified: Secondary | ICD-10-CM

## 2023-12-18 ENCOUNTER — Ambulatory Visit: Payer: Medicare HMO

## 2023-12-18 VITALS — Ht 70.0 in | Wt 200.0 lb

## 2023-12-18 DIAGNOSIS — Z0000 Encounter for general adult medical examination without abnormal findings: Secondary | ICD-10-CM | POA: Diagnosis not present
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
## 2023-12-18 NOTE — Patient Instructions (Signed)
 Andre Sherman,  Thank you for taking the time for your Medicare Wellness Visit. I appreciate your continued commitment to your health goals. Please review the care plan we discussed, and feel free to reach out if I can assist you further.  Medicare recommends these wellness visits once per year to help you and your care team stay ahead of potential health issues. These visits are designed to focus on prevention, allowing your provider to concentrate on managing your acute and chronic conditions during your regular appointments.  Please note that Annual Wellness Visits do not include a physical exam. Some assessments may be limited, especially if the visit was conducted virtually. If needed, we may recommend a separate in-person follow-up with your provider.  Ongoing Care Seeing your primary care provider every 3 to 6 months helps us  monitor your health and provide consistent, personalized care.   Referrals If a referral was made during today's visit and you haven't received any updates within two weeks, please contact the referred provider directly to check on the status.  Recommended Screenings:  Health Maintenance  Topic Date Due   Screening for Lung Cancer  07/15/2023   Flu Shot  09/29/2023   COVID-19 Vaccine (6 - 2025-26 season) 10/30/2023   Medicare Annual Wellness Visit  12/17/2024   DTaP/Tdap/Td vaccine (4 - Td or Tdap) 06/18/2029   Colon Cancer Screening  08/07/2032   Pneumococcal Vaccine for age over 58  Completed   Hepatitis C Screening  Completed   Zoster (Shingles) Vaccine  Completed   Meningitis B Vaccine  Aged Out   Cologuard (Stool DNA test)  Discontinued       12/18/2023    1:46 PM  Advanced Directives  Does Patient Have a Medical Advance Directive? No  Would patient like information on creating a medical advance directive? No - Patient declined   Advance Care Planning is important because it: Ensures you receive medical care that aligns with your values, goals,  and preferences. Provides guidance to your family and loved ones, reducing the emotional burden of decision-making during critical moments.  Vision: Annual vision screenings are recommended for early detection of glaucoma, cataracts, and diabetic retinopathy. These exams can also reveal signs of chronic conditions such as diabetes and high blood pressure.  Dental: Annual dental screenings help detect early signs of oral cancer, gum disease, and other conditions linked to overall health, including heart disease and diabetes.  Please see the attached documents for additional preventive care recommendations.

## 2023-12-28 ENCOUNTER — Telehealth: Payer: Self-pay | Admitting: Pharmacist

## 2023-12-28 NOTE — Telephone Encounter (Signed)
-----   Message from Maude Lagos sent at 11/09/2023 11:29 AM EDT ----- Regarding: FW: Tobacco Intake Assessment  ----- Message ----- From: Lagos Maude MATSU, RPH-CPP Sent: 11/08/2023  12:00 AM EDT To: Maude MATSU Lagos, RPH-CPP Subject: FW: Tobacco Intake Assessment                  Quit or Progress? ----- Message ----- From: Lagos Maude MATSU, RPH-CPP Sent: 09/29/2023  12:00 AM EDT To: Maude MATSU Lagos, RPH-CPP Subject: FW: Tobacco Intake Assessment                  Goal was to quit by end of July   Smoking ~ 6-7 cigs per day in early July. ----- Message ----- From: Lagos Maude MATSU, RPH-CPP Sent: 08/28/2023  12:00 AM EDT To: Maude MATSU Lagos, RPH-CPP Subject: Tobacco Intake Assessment                      Mid- May 2025 smoking reported as 10 cigs per day.

## 2023-12-28 NOTE — Telephone Encounter (Signed)
 Patient contacted for follow/up of tobacco intake reduction.  Since last contact patient reports smoking about the same - reports about 10 or less on a good day.   Continues to rates IMPORTANCE of quitting tobacco as high.   Patient continues to be contemplative about quitting.  We discussed the benefits of intake reduction and her realizes his breathing has improved slightly with intake reduction over the last year.   Congratulated on progress and encouraged continued progress toward quitting.   Total time with patient call and documentation of interaction: 11 minutes. Follow-up phone call planned: 2.5 months.

## 2023-12-29 NOTE — Telephone Encounter (Signed)
 Reviewed and agree with Dr Rennis plan.

## 2024-01-02 NOTE — Progress Notes (Unsigned)
    SUBJECTIVE:   CHIEF COMPLAINT / HPI:   Tobacco use Follows with Dr. Koval for cessation management.  CT chest for lung cancer screening previously ordered.  Started on Anoro 1 puff daily at prior visit in 09/2023.  PERTINENT  PMH / PSH: ***  OBJECTIVE:   There were no vitals taken for this visit. ***  General: NAD, pleasant, able to participate in exam Cardiac: RRR, no murmurs. Respiratory: CTAB, normal effort, No wheezes, rales or rhonchi Abdomen: Bowel sounds present, nontender, nondistended Extremities: no edema or cyanosis. Skin: warm and dry, no rashes noted Neuro: alert, no obvious focal deficits Psych: Normal affect and mood  ASSESSMENT/PLAN:   No problem-specific Assessment & Plan notes found for this encounter.     Dr. Izetta Nap, DO [facility]    {    This will disappear when note is signed, click to select method of visit    :1}

## 2024-01-03 ENCOUNTER — Encounter: Payer: Self-pay | Admitting: Family Medicine

## 2024-01-03 ENCOUNTER — Ambulatory Visit (INDEPENDENT_AMBULATORY_CARE_PROVIDER_SITE_OTHER): Admitting: Family Medicine

## 2024-01-03 VITALS — BP 142/83 | HR 69 | Ht 70.0 in | Wt 201.0 lb

## 2024-01-03 DIAGNOSIS — M25512 Pain in left shoulder: Secondary | ICD-10-CM

## 2024-01-03 DIAGNOSIS — G8929 Other chronic pain: Secondary | ICD-10-CM | POA: Diagnosis not present

## 2024-01-03 DIAGNOSIS — M10071 Idiopathic gout, right ankle and foot: Secondary | ICD-10-CM

## 2024-01-03 DIAGNOSIS — S86902S Unspecified injury of unspecified muscle(s) and tendon(s) at lower leg level, left leg, sequela: Secondary | ICD-10-CM

## 2024-01-03 DIAGNOSIS — G44209 Tension-type headache, unspecified, not intractable: Secondary | ICD-10-CM

## 2024-01-03 DIAGNOSIS — Z8619 Personal history of other infectious and parasitic diseases: Secondary | ICD-10-CM

## 2024-01-03 DIAGNOSIS — Z720 Tobacco use: Secondary | ICD-10-CM

## 2024-01-03 DIAGNOSIS — J449 Chronic obstructive pulmonary disease, unspecified: Secondary | ICD-10-CM | POA: Diagnosis not present

## 2024-01-03 MED ORDER — COLCHICINE 0.6 MG PO TABS: ORAL_TABLET | ORAL | 0 refills | Status: DC

## 2024-01-03 MED ORDER — DICLOFENAC SODIUM 1 % EX GEL
2.0000 g | Freq: Four times a day (QID) | CUTANEOUS | 1 refills | Status: AC | PRN
Start: 2024-01-03 — End: ?

## 2024-01-03 MED ORDER — NORTRIPTYLINE HCL 25 MG PO CAPS
25.0000 mg | ORAL_CAPSULE | Freq: Every day | ORAL | 0 refills | Status: DC
Start: 2024-01-03 — End: 2024-01-23

## 2024-01-03 NOTE — Patient Instructions (Addendum)
 It was wonderful to see you today! Thank you for choosing [facility].   Please bring ALL of your medications with you to every visit.   Today we talked about:  For your headaches I think we can try the medication first and see if it improves your symptoms given the MRI will likely be too cost prohibitive.  Would like you to try to take the nortriptyline at bedtime and we can titrate up over time to see if that helps with your symptoms.  Please continue to try to do the range of motion exercises and use the Voltaren  gel as needed over the affected area.  We can also consider a steroid injection in your shoulder if your shoulders are bothering you too. I reordered the low-dose lung cancer screening, will run it through insurance and then help you get it scheduled. I refilled your pain medication, you should be able to pick it up at the pharmacy. Unfortunately our lab is closed today but I can get the hepatitis see blood work next appointment you have to follow-up on your concern or you can come back sooner and have it done.  Please follow up in 2-3 weeks  If you haven't already, sign up for My Chart to have easy access to your labs results, and communication with your primary care physician.  Call the clinic at [phone] if your symptoms worsen or you have any concerns.  Please be sure to schedule follow up at the front desk before you leave today.   Izetta Nap, DO Family Medicine

## 2024-01-04 DIAGNOSIS — J449 Chronic obstructive pulmonary disease, unspecified: Secondary | ICD-10-CM | POA: Insufficient documentation

## 2024-01-04 DIAGNOSIS — G44209 Tension-type headache, unspecified, not intractable: Secondary | ICD-10-CM | POA: Insufficient documentation

## 2024-01-04 MED ORDER — FENTANYL 25 MCG/HR TD PT72: 1.0000 | MEDICATED_PATCH | TRANSDERMAL | 0 refills | Status: DC

## 2024-01-04 NOTE — Assessment & Plan Note (Signed)
 Mild gout flare of right first MTP - Colchicine  1.2 mg x 1 then 0.6 mg 1 hour later

## 2024-01-04 NOTE — Assessment & Plan Note (Signed)
 COPD with wheezing, inconsistent use of Anoro inhaler. Emphasized daily use to manage symptoms and prevent exacerbations. - Instructed to use Anoro inhaler daily.

## 2024-01-04 NOTE — Assessment & Plan Note (Signed)
 Current smoker, follows with Dr. Koval for cessation management. - CT of the chest for lung cancer screening.

## 2024-01-04 NOTE — Assessment & Plan Note (Signed)
 PDMP reviewed and appropriate, current regimen has kept patient functional.  Will refill 45-month supply

## 2024-01-04 NOTE — Assessment & Plan Note (Signed)
 Headaches occur almost daily, likely tension-related due to shoulder pain and cervical spondylosis. Previous CT in 06/2023 reviewed, discussed MRI but likely low yield and cost prohibitive. - Start nortriptyline 25 mg nightly, likely need to titrate

## 2024-01-12 ENCOUNTER — Other Ambulatory Visit: Payer: Self-pay | Admitting: Family Medicine

## 2024-01-22 ENCOUNTER — Telehealth: Payer: Self-pay

## 2024-01-22 DIAGNOSIS — G44209 Tension-type headache, unspecified, not intractable: Secondary | ICD-10-CM

## 2024-01-22 NOTE — Telephone Encounter (Signed)
 Patient calls nurse requesting a refill on Nortriptyline .   He reports this medication has greatly improved his headaches. He reports most of the time he only needs one per day. However, he reports sometimes he will take one in the AM and one in PM if needed.   He reports he has only done that maybe 3 times.   He is requesting a refill for next month.   Advised will forward to PCP.

## 2024-01-23 MED ORDER — NORTRIPTYLINE HCL 50 MG PO CAPS: 50.0000 mg | ORAL_CAPSULE | Freq: Every day | ORAL | 2 refills | Status: DC

## 2024-01-23 NOTE — Telephone Encounter (Cosign Needed)
 Nortriptyline  dose increased to 50 mg nightly per patient request as discussed at prior office visit.

## 2024-01-23 NOTE — Telephone Encounter (Signed)
Pt informed. Andre Sherman, CMA  

## 2024-01-31 ENCOUNTER — Telehealth: Payer: Self-pay

## 2024-01-31 NOTE — Telephone Encounter (Signed)
 Called centralized scheduling and spoke with Norman to schedule a CT Chest Lung Screening.  Was able to schedule patient for 02/08/2024 with arrival time being at 2:45pm. Appointment time will be at 3pm. Location will be at [facility]. No Prep is needed.  Called patient to make aware of upcoming appointment, did offer patient centralized scheduling number just in case time and date didn't work for him. He stated that the date and time is fine and he will be at the CT appointment.  Harlene Reiter, CMA

## 2024-02-08 ENCOUNTER — Ambulatory Visit (HOSPITAL_COMMUNITY): Admission: RE | Admit: 2024-02-08 | Discharge: 2024-02-08 | Attending: Family Medicine | Admitting: Family Medicine

## 2024-02-08 DIAGNOSIS — Z122 Encounter for screening for malignant neoplasm of respiratory organs: Secondary | ICD-10-CM | POA: Insufficient documentation

## 2024-02-08 DIAGNOSIS — I770 Arteriovenous fistula, acquired: Secondary | ICD-10-CM | POA: Diagnosis not present

## 2024-02-08 DIAGNOSIS — J439 Emphysema, unspecified: Secondary | ICD-10-CM | POA: Insufficient documentation

## 2024-02-08 DIAGNOSIS — I2510 Atherosclerotic heart disease of native coronary artery without angina pectoris: Secondary | ICD-10-CM | POA: Insufficient documentation

## 2024-02-08 DIAGNOSIS — Z720 Tobacco use: Secondary | ICD-10-CM

## 2024-02-08 DIAGNOSIS — F17210 Nicotine dependence, cigarettes, uncomplicated: Secondary | ICD-10-CM | POA: Insufficient documentation

## 2024-02-08 DIAGNOSIS — I77810 Thoracic aortic ectasia: Secondary | ICD-10-CM | POA: Diagnosis not present

## 2024-02-10 ENCOUNTER — Other Ambulatory Visit: Payer: Self-pay | Admitting: Family Medicine

## 2024-02-10 DIAGNOSIS — J449 Chronic obstructive pulmonary disease, unspecified: Secondary | ICD-10-CM

## 2024-02-12 ENCOUNTER — Other Ambulatory Visit: Payer: Self-pay | Admitting: Family Medicine

## 2024-02-12 DIAGNOSIS — J449 Chronic obstructive pulmonary disease, unspecified: Secondary | ICD-10-CM

## 2024-02-13 ENCOUNTER — Ambulatory Visit: Payer: Self-pay | Admitting: Family Medicine

## 2024-02-17 ENCOUNTER — Other Ambulatory Visit: Payer: Self-pay | Admitting: Family Medicine

## 2024-02-17 DIAGNOSIS — G44209 Tension-type headache, unspecified, not intractable: Secondary | ICD-10-CM

## 2024-03-05 DIAGNOSIS — M1A0790 Idiopathic chronic gout, unspecified ankle and foot, without tophus (tophi): Secondary | ICD-10-CM

## 2024-03-05 MED ORDER — ALLOPURINOL 100 MG PO TABS: 100.0000 mg | ORAL_TABLET | Freq: Every day | ORAL | 3 refills | Status: AC

## 2024-03-13 ENCOUNTER — Telehealth: Payer: Self-pay | Admitting: Pharmacist

## 2024-03-13 NOTE — Telephone Encounter (Signed)
-----   Message from Maude Lagos sent at 12/28/2023  3:45 PM EDT ----- Regarding: FW: Tobacco Intake Assessment  ----- Message ----- From: Lagos Maude MATSU, RPH-CPP Sent: 12/26/2023  12:00 AM EDT To: Maude MATSU Lagos, RPH-CPP Subject: FW: Tobacco Intake Assessment                   ----- Message ----- From: Lagos Maude MATSU, RPH-CPP Sent: 11/08/2023  12:00 AM EDT To: Maude MATSU Lagos, RPH-CPP Subject: FW: Tobacco Intake Assessment                  Quit or Progress? ----- Message ----- From: [place]os Maude MATSU, RPH-CPP Sent: 09/29/2023  12:00 AM EDT To: Maude MATSU Lagos, RPH-CPP Subject: FW: Tobacco Intake Assessment                  Goal was to quit by end of July   Smoking ~ 6-7 cigs per day in early July. ----- Message ----- From: Lagos Maude MATSU, RPH-CPP Sent: 08/28/2023  12:00 AM EDT To: Maude MATSU Lagos, RPH-CPP Subject: Tobacco Intake Assessment                      Mid- May 2025 smoking reported as 10 cigs per day.

## 2024-03-13 NOTE — Telephone Encounter (Signed)
 Attempted to contact patient for follow-up of tobacco use.  No future visit in-office scheduled at this time.   Left HIPAA compliant voice mail requesting call back to  My direct phone: [phone]   Total time with patient call and documentation of interaction: 4 minutes.  Follow-up phone call planned: 1 month

## 2024-03-17 ENCOUNTER — Other Ambulatory Visit: Payer: Self-pay | Admitting: Family Medicine

## 2024-03-17 DIAGNOSIS — J449 Chronic obstructive pulmonary disease, unspecified: Secondary | ICD-10-CM

## 2024-03-18 ENCOUNTER — Other Ambulatory Visit: Payer: Self-pay | Admitting: Family Medicine

## 2024-03-18 DIAGNOSIS — J449 Chronic obstructive pulmonary disease, unspecified: Secondary | ICD-10-CM

## 2024-03-20 NOTE — Telephone Encounter (Signed)
 Consider routing this problem to [facility] to see what options there are, including changing to different LABA-LAMA like Stiolto, Brevesp;I or [employer].

## 2024-04-01 ENCOUNTER — Ambulatory Visit: Payer: Self-pay | Admitting: Student

## 2024-04-02 ENCOUNTER — Encounter: Payer: Self-pay | Admitting: Family Medicine

## 2024-04-02 ENCOUNTER — Ambulatory Visit: Admitting: Family Medicine

## 2024-04-02 VITALS — BP 129/67 | HR 100 | Ht 70.0 in | Wt 197.0 lb

## 2024-04-02 DIAGNOSIS — I700 Atherosclerosis of aorta: Secondary | ICD-10-CM | POA: Diagnosis not present

## 2024-04-02 DIAGNOSIS — J449 Chronic obstructive pulmonary disease, unspecified: Secondary | ICD-10-CM

## 2024-04-02 DIAGNOSIS — G44209 Tension-type headache, unspecified, not intractable: Secondary | ICD-10-CM

## 2024-04-02 DIAGNOSIS — S86902S Unspecified injury of unspecified muscle(s) and tendon(s) at lower leg level, left leg, sequela: Secondary | ICD-10-CM

## 2024-04-02 MED ORDER — ASPIRIN 81 MG PO TBEC: 81.0000 mg | DELAYED_RELEASE_TABLET | Freq: Every day | ORAL | 12 refills | Status: AC

## 2024-04-02 NOTE — Progress Notes (Unsigned)
" ° ° °  SUBJECTIVE:   CHIEF COMPLAINT / HPI:   Tobacco use COPD Using Anoro daily. Smoking about half a pack daily. Has not been increasing.   Headaches Mostly resolved on Nortriptyline .  PERTINENT  PMH / PSH: ***  OBJECTIVE:   There were no vitals taken for this visit. ***  General: NAD, pleasant, able to participate in exam Cardiac: RRR, no murmurs. Respiratory: CTAB, normal effort, No wheezes, rales or rhonchi Abdomen: Bowel sounds present, nontender, nondistended Extremities: no edema or cyanosis. Skin: warm and dry, no rashes noted Neuro: alert, no obvious focal deficits Psych: Normal affect and mood  ASSESSMENT/PLAN:   No problem-specific Assessment & Plan notes found for this encounter.     Dr. Izetta Nap, DO [place] [facility]    {    This will disappear when note is signed, click to select method of visit    :1} "

## 2024-04-02 NOTE — Patient Instructions (Addendum)
 It was wonderful to see you today! Thank you for choosing [facility].   Please bring ALL of your medications with you to every visit.   Today we talked about:  I am glad the headaches have mostly resolved! Lets keep you on the medication for at least 6 months to ensure your symptoms are stable.  Please try to continue to cut back smoking and reduce the amount of cigarettes you have daily.  I do recommend you continue on the Anoro 1 puff every day to help with your COPD symptoms.  Will continue to do the yearly CT of your chest for monitoring for any lung cancer progression and to monitor other changes as we discussed. I would like you to start taking a baby aspirin  81 mg daily given the plaque in the vessels around your heart.  I sent that to your pharmacy but you can also obtain it over-the-counter with your benefits. I will also send in the refill of your pain patches, we can continue on this for 3 more months if you have any concerns please let me know.  Please follow up in 3 months   Call the clinic at [phone] if your symptoms worsen or you have any concerns.  Please be sure to schedule follow up at the front desk before you leave today.   Izetta Nap, DO Family Medicine

## 2024-04-04 MED ORDER — FENTANYL 25 MCG/HR TD PT72: 1.0000 | MEDICATED_PATCH | TRANSDERMAL | 0 refills | Status: AC

## 2024-04-04 NOTE — Assessment & Plan Note (Signed)
 Intermittent exacerbations with increased coughing and mucus production, consistent with chronic bronchitis. Continues to smoke half a pack per day, acknowledges difficulty in reducing smoking. - Use Mucinex as needed for cough and mucus production. - Maintain adequate hydration to thin mucus. - Encouraged continued efforts to maintain current smoking levels

## 2024-04-04 NOTE — Assessment & Plan Note (Signed)
 Mostly resolved with nortriptyline .  Also reports improved sleep and mood.  Patient wondering how long he needs to stay on medication, would do at least 6-month trial prior to considering trial off medication.

## 2024-04-04 NOTE — Assessment & Plan Note (Signed)
 Controlled on current regimen, refill Fentanyl  patches x 50-month supply
